# Patient Record
Sex: Female | Born: 1980 | Race: White | Hispanic: No | Marital: Single | Smoking: Former smoker
Health system: Southern US, Community
[De-identification: ages and names within clinical notes are randomized; demographics above are authoritative.]

## PROBLEM LIST (undated history)

## (undated) DIAGNOSIS — R519 Headache, unspecified: Secondary | ICD-10-CM

## (undated) DIAGNOSIS — Z8719 Personal history of other diseases of the digestive system: Secondary | ICD-10-CM

## (undated) DIAGNOSIS — Z8619 Personal history of other infectious and parasitic diseases: Secondary | ICD-10-CM

## (undated) DIAGNOSIS — Z8659 Personal history of other mental and behavioral disorders: Secondary | ICD-10-CM

## (undated) DIAGNOSIS — G43909 Migraine, unspecified, not intractable, without status migrainosus: Secondary | ICD-10-CM

## (undated) DIAGNOSIS — R87629 Unspecified abnormal cytological findings in specimens from vagina: Secondary | ICD-10-CM

## (undated) HISTORY — PX: MOUTH SURGERY: SHX715

## (undated) HISTORY — DX: Migraine, unspecified, not intractable, without status migrainosus: G43.909

## (undated) HISTORY — DX: Personal history of other diseases of the digestive system: Z87.19

## (undated) HISTORY — DX: Personal history of other infectious and parasitic diseases: Z86.19

## (undated) HISTORY — DX: Personal history of other mental and behavioral disorders: Z86.59

## (undated) HISTORY — DX: Headache, unspecified: R51.9

## (undated) HISTORY — DX: Unspecified abnormal cytological findings in specimens from vagina: R87.629

---
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
## 2001-12-10 ENCOUNTER — Encounter: Payer: Self-pay | Admitting: Emergency Medicine

## 2001-12-10 ENCOUNTER — Inpatient Hospital Stay (HOSPITAL_COMMUNITY): Admission: EM | Admit: 2001-12-10 | Discharge: 2001-12-12 | Payer: Self-pay | Admitting: Emergency Medicine

## 2001-12-12 ENCOUNTER — Encounter: Payer: Self-pay | Admitting: Family Medicine

## 2001-12-13 ENCOUNTER — Encounter: Payer: Self-pay | Admitting: Family Medicine

## 2001-12-13 ENCOUNTER — Ambulatory Visit (HOSPITAL_COMMUNITY): Admission: RE | Admit: 2001-12-13 | Discharge: 2001-12-13 | Payer: Self-pay | Admitting: Family Medicine

## 2003-08-18 ENCOUNTER — Inpatient Hospital Stay (HOSPITAL_COMMUNITY): Admission: AD | Admit: 2003-08-18 | Discharge: 2003-08-21 | Payer: Self-pay | Admitting: *Deleted

## 2003-08-18 ENCOUNTER — Observation Stay (HOSPITAL_COMMUNITY): Admission: EM | Admit: 2003-08-18 | Discharge: 2003-08-18 | Payer: Self-pay | Admitting: Emergency Medicine

## 2006-10-27 ENCOUNTER — Emergency Department (HOSPITAL_COMMUNITY): Admission: EM | Admit: 2006-10-27 | Discharge: 2006-10-27 | Payer: Self-pay | Admitting: Emergency Medicine

## 2008-07-25 ENCOUNTER — Emergency Department (HOSPITAL_COMMUNITY): Admission: EM | Admit: 2008-07-25 | Discharge: 2008-07-25 | Payer: Self-pay | Admitting: Emergency Medicine

## 2008-07-28 ENCOUNTER — Emergency Department (HOSPITAL_COMMUNITY): Admission: EM | Admit: 2008-07-28 | Discharge: 2008-07-28 | Payer: Self-pay | Admitting: Emergency Medicine

## 2010-08-28 LAB — URINALYSIS, ROUTINE W REFLEX MICROSCOPIC
Bilirubin Urine: NEGATIVE
Bilirubin Urine: NEGATIVE
Glucose, UA: NEGATIVE mg/dL
Glucose, UA: NEGATIVE mg/dL
Ketones, ur: NEGATIVE mg/dL
Ketones, ur: NEGATIVE mg/dL
Nitrite: NEGATIVE
Nitrite: NEGATIVE
Protein, ur: 30 mg/dL — AB
Protein, ur: NEGATIVE mg/dL
Specific Gravity, Urine: 1.018 (ref 1.005–1.030)
Specific Gravity, Urine: 1.025 (ref 1.005–1.030)
Urobilinogen, UA: 0.2 mg/dL (ref 0.0–1.0)
Urobilinogen, UA: 0.2 mg/dL (ref 0.0–1.0)
pH: 5.5 (ref 5.0–8.0)
pH: 7 (ref 5.0–8.0)

## 2010-08-28 LAB — URINE MICROSCOPIC-ADD ON

## 2010-08-28 LAB — COMPREHENSIVE METABOLIC PANEL
ALT: 30 U/L (ref 0–35)
AST: 26 U/L (ref 0–37)
Albumin: 3.8 g/dL (ref 3.5–5.2)
Alkaline Phosphatase: 68 U/L (ref 39–117)
BUN: 10 mg/dL (ref 6–23)
CO2: 27 mEq/L (ref 19–32)
Calcium: 9.3 mg/dL (ref 8.4–10.5)
Chloride: 105 mEq/L (ref 96–112)
Creatinine, Ser: 0.75 mg/dL (ref 0.4–1.2)
GFR calc Af Amer: >60 mL/min (ref >60)
GFR calc non Af Amer: >60 mL/min (ref >60)
Glucose, Bld: 98 mg/dL (ref 70–99)
Potassium: 4.3 mEq/L (ref 3.5–5.1)
Sodium: 139 mEq/L (ref 135–145)
Total Bilirubin: 0.5 mg/dL (ref 0.3–1.2)
Total Protein: 6.8 g/dL (ref 6.0–8.3)

## 2010-08-28 LAB — DIFFERENTIAL
Basophils Absolute: 0 10*3/uL (ref 0.0–0.1)
Basophils Absolute: 0.1 10*3/uL (ref 0.0–0.1)
Basophils Relative: 0 % (ref 0–1)
Basophils Relative: 1 % (ref 0–1)
Eosinophils Absolute: 0.1 10*3/uL (ref 0.0–0.7)
Eosinophils Absolute: 0.1 10*3/uL (ref 0.0–0.7)
Eosinophils Relative: 0 % (ref 0–5)
Eosinophils Relative: 0 % (ref 0–5)
Lymphocytes Relative: 5 % — ABNORMAL LOW (ref 12–46)
Lymphocytes Relative: 7 % — ABNORMAL LOW (ref 12–46)
Lymphs Abs: 0.7 10*3/uL (ref 0.7–4.0)
Lymphs Abs: 1.1 10*3/uL (ref 0.7–4.0)
Monocytes Absolute: 0.4 10*3/uL (ref 0.1–1.0)
Monocytes Absolute: 0.7 10*3/uL (ref 0.1–1.0)
Monocytes Relative: 3 % (ref 3–12)
Monocytes Relative: 5 % (ref 3–12)
Neutro Abs: 12.3 10*3/uL — ABNORMAL HIGH (ref 1.7–7.7)
Neutro Abs: 13 10*3/uL — ABNORMAL HIGH (ref 1.7–7.7)
Neutrophils Relative %: 87 % — ABNORMAL HIGH (ref 43–77)
Neutrophils Relative %: 90 % — ABNORMAL HIGH (ref 43–77)

## 2010-08-28 LAB — POCT PREGNANCY, URINE: Preg Test, Ur: NEGATIVE

## 2010-08-28 LAB — URINE CULTURE
Colony Count: NO GROWTH
Culture: NO GROWTH

## 2010-08-28 LAB — POCT I-STAT, CHEM 8
BUN: 9 mg/dL (ref 6–23)
Calcium, Ion: 0.99 mmol/L — ABNORMAL LOW (ref 1.12–1.32)
Chloride: 107 mEq/L (ref 96–112)
Creatinine, Ser: 0.9 mg/dL (ref 0.4–1.2)
Glucose, Bld: 100 mg/dL — ABNORMAL HIGH (ref 70–99)
HCT: 39 % (ref 36.0–46.0)
Hemoglobin: 13.3 g/dL (ref 12.0–15.0)
Potassium: 3.7 mEq/L (ref 3.5–5.1)
Sodium: 138 mEq/L (ref 135–145)
TCO2: 23 mmol/L (ref 0–100)

## 2010-08-28 LAB — CBC
HCT: 38.2 % (ref 36.0–46.0)
HCT: 38.3 % (ref 36.0–46.0)
Hemoglobin: 12.9 g/dL (ref 12.0–15.0)
Hemoglobin: 13.4 g/dL (ref 12.0–15.0)
MCHC: 33.9 g/dL (ref 30.0–36.0)
MCHC: 34.9 g/dL (ref 30.0–36.0)
MCV: 91.5 fL (ref 78.0–100.0)
MCV: 91.7 fL (ref 78.0–100.0)
Platelets: 353 10*3/uL (ref 150–400)
Platelets: 375 10*3/uL (ref 150–400)
RBC: 4.16 MIL/uL (ref 3.87–5.11)
RBC: 4.19 MIL/uL (ref 3.87–5.11)
RDW: 13 % (ref 11.5–15.5)
RDW: 13.3 % (ref 11.5–15.5)
WBC: 13.6 10*3/uL — ABNORMAL HIGH (ref 4.0–10.5)
WBC: 14.9 10*3/uL — ABNORMAL HIGH (ref 4.0–10.5)

## 2010-08-28 LAB — WET PREP, GENITAL
Clue Cells Wet Prep HPF POC: NONE SEEN
Trich, Wet Prep: NONE SEEN
Trich, Wet Prep: NONE SEEN
WBC, Wet Prep HPF POC: NONE SEEN
WBC, Wet Prep HPF POC: NONE SEEN
Yeast Wet Prep HPF POC: NONE SEEN
Yeast Wet Prep HPF POC: NONE SEEN

## 2010-08-28 LAB — GC/CHLAMYDIA PROBE AMP, GENITAL
Chlamydia, DNA Probe: NEGATIVE
Chlamydia, DNA Probe: NEGATIVE
GC Probe Amp, Genital: NEGATIVE
GC Probe Amp, Genital: NEGATIVE

## 2010-08-28 LAB — LACTIC ACID, PLASMA: Lactic Acid, Venous: 1.3 mmol/L (ref 0.5–2.2)

## 2010-08-28 LAB — PREGNANCY, URINE: Preg Test, Ur: NEGATIVE

## 2010-08-28 LAB — LIPASE, BLOOD: Lipase: 20 U/L (ref 11–59)

## 2010-10-03 NOTE — H&P (Signed)
NAME:  [name], [name]                           ACCOUNT NO.:  [id number]   MEDICAL RECORD NO.:  [id number]                   PATIENT TYPE:  EMS   LOCATION:  MAJO                                 FACILITY:  [facility]   PHYSICIAN:  Burnice Logan, M.D.               DATE OF BIRTH:  [birth year]   DATE OF ADMISSION:  08/17/2003  DATE OF DISCHARGE:                                HISTORY & PHYSICAL   PRIMARY PHYSICIAN:  Mosetta Putt, M.D.   CHIEF COMPLAINT:  Xanax overdose.   HISTORY OF PRESENT ILLNESS:  This is a 30 year old female patient who admits  taking 5 to 6 tablets of Xanax sometime last night.  This was an apparent  suicide gesture.  She admits having intentions of harming herself.  She  attributes this to inability to cope lately with her stresses in life.  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  She has not  been able to handle these issues very well leading her to overdose on her  medicines.  She has a history of depression but denies any previous such  episode.  She is accompanied by her parents to the emergency room today.  While here in the emergency room, she has had episode of hypotension and  current blood pressure is 86/45.  I was therefore asked to admit the patient  until she is medically stable to be transferred to a psychiatric unit.   PAST MEDICAL HISTORY:  Nothing significant except for depression, migraine  headaches, and dysmenorrhea.   ALLERGIES:  HYDROCODONE.   MEDICATIONS:  Xanax, Wellbutrin XL, Zomig, and Ortho-Novum 1/35.   FAMILY HISTORY:  The patient's grandmother was an alcoholic.  Her father  suffers from depression.   SOCIAL HISTORY:  The patient lives with her parents.  She works in [employer] and has held that job for the last 2 years.  She denies smoking or use  of drugs.  She drinks occasional alcohol but has not done so tonight.   REVIEW OF SYSTEMS:  Denies fever, chills.  No weight loss.  No headaches  presently.  Has no difficulty breathing.  No cough or sputum.  No chest  pain.  Has epigastric pain without nausea or vomiting or diarrhea.  No  dysuria or hematuria.  No myalgias or arthralgias.   PHYSICAL EXAMINATION:  GENERAL:  Young Caucasian lady, well nourished,  average size.  VITAL SIGNS:  Temperature 98, blood pressure 85/44, heart rate 59,  respiratory rate 18 to 20.  HEENT:  Normocephalic, atraumatic.  No facial asymmetry.  Normal eye  movements.  Pupils are round, equal, and symmetric.  Mucous membranes are  moist.  NECK:  Supple.  No thyromegaly.  LUNGS:  Clear to auscultation.  CARDIOVASCULAR:  Heart sounds 1 and  2 regular.  No murmurs.  ABDOMEN:  Soft, nontender.  Normal bowel sounds.  CENTRAL NERVOUS SYSTEM:  Alert and oriented x3 with no focal deficits.  EXTREMITIES:  No pedal edema.   LABORATORY DATA:  Her pH on her ABG 7.39.  Urine drug screen positive only  for benzodiazepines.  Electrolytes within normal limits with glucose 101,  sodium 137, potassium 4.2, chloride 105, BUN 5, creatinine 0.9.  Hemoglobin  is 13.2, hematocrit 39.  Tylenol and salicylate levels are negative and so  is alcohol level.   ASSESSMENT AND PLAN:  Benzodiazepine overdose.  This was an apparent suicide  gesture.  The patient will be admitted to a step-down unit with suicide  precautions.  Will hydrate her with normal saline to bring her blood  pressure up.  My inkling is that her normal blood pressure may be a little  low at 100 systolic.  She currently is asymptomatic and not tachycardic for  her current blood pressure.  She complains of some epigastric discomfort and  she will be given Maalox on a p.r.n. basis.  I will also resume her Ortho-  Novum and treat her with Zomig as needed for migraine headaches.  Her  Wellbutrin will be resumed pending a psychiatric evaluation.  The patient  will initially be observed on  medicine service for 24 hours following which  I anticipate her being transferred to the psychiatric service.                                                Burnice Logan, M.D.    ES/MEDQ  D:  08/18/2003  T:  08/19/2003  Job:  [id number]   cc:   Mosetta Putt, M.D.  [address] [place]  [place] [address]  Fax: [phone]

## 2010-10-03 NOTE — Discharge Summary (Signed)
[name], [name] NO.:  [id number]   MEDICAL RECORD NO.:  [id number]                  PATIENT TYPE:   LOCATION:                                       FACILITY:   PHYSICIAN:  Carolyne Fiscal, M.D.             DATE OF BIRTH:   DATE OF ADMISSION:  12/10/2001  DATE OF DISCHARGE:  12/12/2001                                 DISCHARGE SUMMARY   DISCHARGE DIAGNOSES:  Abdominal pain, probably functional.   HISTORY:  This is a 30 year old white female patient with a 5-year history  of epigastric pain radiating into the lower abdomen associated with nausea.  She was seen as an outpatient 5 days prior to admission and was given  Phenergan, also Vicodin.  She had a prior history of pancreatitis.  In the  24 hours preceding this admission, her pain increased and was associated  with vomiting.  She had not had a prior intolerance to hydrocodone.  Last  menstrual period was 1 week prior to admission and normal, and she was on  oral contraceptives.  She did have a documented episode of pancreatitis at  age 45, felt to be viral.  She had a history of drinking 1 alcoholic drink  per day.   PAST MEDICAL HISTORY:  Otherwise unremarkable except for depression a few  months preceding admission.   PHYSICAL EXAMINATION:   GENERAL:  Physical examination at the time of admission revealed the patient  to be tired-appearing and mildly anxious.   VITAL SIGNS:  Blood pressure:  120/60.  Pulse:  64.  Respirations:  20.  Temperature:  97.9.   HEAD AND NECK EXAM:  Negative except for minimal inflammation of the  pharynx.   LUNGS:  Clear.   HEART:  Negative.   ABDOMEN:  Tender in the epigastric area and in both lower quadrants, the  right more than the left.   PELVIC EXAMINATION:  Negative except for tenderness in the right lower  quadrant.   RECTAL:  Negative.  Hematest-negative stool.   ADMISSION DIAGNOSIS:  1. Acute abdominal pain, questionable etiology,  rule-out pancreatitis.  2. History of depression.   LABORATORY DATA:  A CT scan of the abdomen with contrast was negative.  A CT  scan of the pelvis was negative.  A small bowel follow-through was normal.  At the time admission, CBC was negative.  Sed rate was 1, and CMET was  unremarkable.  Laboratory studies remained normal during her admission.  Her  TSH was normal.  Her urine pregnancy test was negative.  Her urinalysis was  unremarkable.  A GC and Chlamydia probe of the cervix was negative.  RPR was  nonreactive.   HOSPITAL COURSE:  Patient remained afebrile after admission and continued to  have pain in the epigastric right upper quadrant and both lower quadrants of  the abdomen.  She was seen in GI consultation by Dr.  Lina Sar.  She felt  that the patient's abdominal symptoms were difficult to explain and that  they might be associated with decreased pain threshold, although multiple  other etiologies need to be ruled out.  She also elicited a history of an  eating disorder, although that did not readily explain the patient's  symptoms.  She recommended upper endoscopy, which was done on December 10, 2001,  and was normal.  When the patient was initially admitted, she was given  intravenous fluids and also Demerol, Phenergan, and Protonix IV.  She was  later given Zelnorm 6 mg once daily a.c.  She had gradual improvement of her  abdominal pain.  She was improved on the day of discharge but still had  right upper quadrant discomfort, which was fairly constant.  It was felt  that there were multiple stresses in the patient's personal life  exacerbating her depression and anxiety.  It was felt that she could be  discharged if followed closely as an outpatient.   CONDITION AT DISCHARGE:  Satisfactory.  She was seen by Dr. Leone Payor in GI  followup consultation.  He recommended a HIDA scan after discharge.  Follow  up in the office by me 2 days after discharge.   DISCHARGE MEDICATIONS:   Oral contraceptives, Luvox 150 mg daily, start  Nexium 40 mg daily, Vicodin 1 to 2 q.4h. p.r.n. for pain.  Patient did not  have any drug benefits and could not afford Zelnorm.  Some consideration was  given to using MiraLax as an outpatient after discharge.                                               Carolyne Fiscal, M.D.    PFB/MEDQ  D:  01/01/2002  T:  01/04/2002  Job:  [id number]

## 2010-10-03 NOTE — H&P (Signed)
[place]  Patient:    [name], [name] Visit Number: [id number] MRN: [id number]          Service Type: MED Location: 5000 5035 01 Attending Physician:  Boneta Lucks Dictated by:   Heather Roberts, M.D. Admit Date:  12/09/2001   CC:         Carolyne Fiscal, M.D.  Hedwig Morton. Juanda Chance, M.D. [facility]   History and Physical  CHIEF COMPLAINT:  Abdominal pain.  HISTORY OF PRESENT ILLNESS:  [age]-year-old white single female with five days history of epigastric pain radiating to her lower abdomen associated with nausea.  She saw her primary care physician on July 21st, who prescribed Phenergan and hydrocodone because of a prior history of pancreatitis.  In the 24 hours prior to admission, her pain increased and was associated with vomiting.  She took hydrocodone without relief of the pain. She had been concerned because of sensitivity to codeine.  LMP was a week prior to admission and normal.  The patient denies sexual activity ever; parents were in the room at the time of this questioning.  She has a history of pancreatitis at age 30, thought viral.  The patient says she drinks one glass of alcohol a day.  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  CURRENT PRESCRIPTIONS:  BCP for dysmenorrhea and Prozac.  ALLERGIES:  CODEINE.  PAST MEDICAL HISTORY:  See above.  FAMILY HISTORY:  Grandparents have heart disease, maternal aunt -- ovarian cancer.  Denies thyroid or lung.  Father has hiatal hernia.  PHYSICAL EXAMINATION:  GENERAL:  Physical exam reveals a tired, mildly anxious white female.  VITAL SIGNS:  Temperature 97, pulse 101, respiratory rate 18.  HEENT/NECK:  Pharynx minimally injected.  Carotids 2+.  No bruits.  No thyromegaly.  Tympanic membranes negative.  Fundi grossly normal.  CARDIAC:  Normal S1 and S2.  BREASTS:  Normal.  LUNGS:  Clear to A&P.  ABDOMEN:  Abdomen is tender with  diminished breath sounds, no distention, very tender epigastrium, right greater than left lower quadrant equivocal rebound.  PELVIC:  Pelvic shows midpositioned uterus which is nontender, no CMT.  Pelvic shows tender right lower quadrant, no masses.  RECTAL:  Stool is OB-negative.  Rectal exam negative.  LABORATORY AND ACCESSORY DATA:  Pelvic ultrasound allegedly done December 06, 2001 was reported as within normal limits.  CT of abdomen and pelvis within normal limits, some stool.  CBC, CMP and lipase here normal.  IMPRESSION: 1. Acute abdominal pain, questionable etiology.  Amylase will be added to the    laboratory. 2. History of depression.  PLAN:  Admission for IV fluids, further delineation of discomfort, sed rate, amylase, Dr. Hedwig Morton. Brodie consult, Protonix, probable ______ on the day of admission. Dictated by:   Heather Roberts, M.D. Attending Physician:  Boneta Lucks DD:  12/10/01 TD:  12/13/01 Job: [id number]
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
## 2010-10-03 NOTE — Procedures (Signed)
[place]  Patient:    [name], [name] Visit Number: [id number] MRN: [id number]          Service Type: MED Location: 5000 5035 01 Attending Physician:  Boneta Lucks Dictated by:   Hedwig Morton. Juanda Chance, M.D. [facility] Admit Date:  12/09/2001   CC:         Carolyne Fiscal, M.D.   Procedure Report  PROCEDURE:  Upper endoscopy.  ENDOSCOPIST:  Hedwig Morton. Juanda Chance, M.D.  INDICATIONS:  This 31 year old white female was admitted with a five-day history of severe epigastric and right upper quadrant abdominal pain, negative CT scan and all blood tests.  She has a history of similar pain several years ago; she also was diagnosed with eating disorder one year ago.  She vomited on two occasions yesterday.  She is undergoing upper endoscopy to further evaluate her severe upper abdominal pain.  ENDOSCOPE:  Fujinon single-channel video endoscope.  SEDATION:  Versed 7 mg IV, fentanyl 50 mcg IV.  FINDINGS:  Fujinon single-channel video endoscope was passed under direct vision through posterior pharynx into esophagus.  The patient was monitored by a pulse oximeter; her oxygen saturations were normal.  The patient was rather agitated at the beginning of the procedure but she calmed down and was cooperative throughout the rest of the procedure.  Proximal, mid and distal esophageal mucosa was unremarkable with no evidence of inflammatory changes; specifically, the squamocolumnar junction was normal, there was no stricture and no hiatal hernia.  Stomach:  Stomach was insufflated with air and showed normal-appearing gastric folds, gastric body and gastric antrum.  There were no signs of gastritis. The pyloric outlet was normal.  Retroflexion of the endoscope revealed normal fundus and cardia.  Duodenum:  Duodenal bulb and descending duodenum were normal.  IMPRESSION:  Normal endoscopy of esophagus, stomach and duodenum.  PLAN:  The patient will be further  evaluated for her abdominal pain with small-bowel follow-through to rule out Crohns disease.  We will slowly advance her diet and await results of the blood tests that are pending. Dictated by:   Hedwig Morton. Juanda Chance, M.D. [facility] Attending Physician:  Boneta Lucks DD:  12/10/01 TD:  12/13/01 Job: [id number]
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
## 2015-03-05 ENCOUNTER — Ambulatory Visit (INDEPENDENT_AMBULATORY_CARE_PROVIDER_SITE_OTHER): Payer: Managed Care, Other (non HMO) | Admitting: Physician Assistant

## 2015-03-05 ENCOUNTER — Encounter: Payer: Self-pay | Admitting: Physician Assistant

## 2015-03-05 ENCOUNTER — Other Ambulatory Visit (HOSPITAL_COMMUNITY)
Admission: RE | Admit: 2015-03-05 | Discharge: 2015-03-05 | Disposition: A | Discharge status: Home / Self Care | Payer: Managed Care, Other (non HMO) | Source: Ambulatory Visit | Attending: Physician Assistant | Admitting: Physician Assistant

## 2015-03-05 VITALS — BP 94/72 | HR 93 | Ht 64.0 in | Wt 158.0 lb

## 2015-03-05 DIAGNOSIS — J069 Acute upper respiratory infection, unspecified: Secondary | ICD-10-CM | POA: Diagnosis not present

## 2015-03-05 DIAGNOSIS — N63 Unspecified lump in breast: Secondary | ICD-10-CM | POA: Diagnosis not present

## 2015-03-05 DIAGNOSIS — N6320 Unspecified lump in the left breast, unspecified quadrant: Secondary | ICD-10-CM | POA: Insufficient documentation

## 2015-03-05 DIAGNOSIS — N6452 Nipple discharge: Secondary | ICD-10-CM | POA: Insufficient documentation

## 2015-03-05 NOTE — Progress Notes (Signed)
   Subjective:    Patient ID: [name], female    DOB: 09/04/1980, 34 y.o.   MRN: [id number]  HPI  Pt is a 34 yo female who presents to the clinic to establish care.   She is concerned with a left breast lump for one year and recent 3 day hx of nipple discharge. Describes discharge as cloudy white to yellow. No breast pain. fam hx of BC with maternal grandmother and aunt. She has had a breast U/S in 2010 for another lump. No fever, chills or weight loss.   She woke up this morning with ST, nasal congestion and ear pain. No fever or chills. Not tried anything to make better.   .. Active Ambulatory Problems    Diagnosis Date Noted  . Left breast lump 03/05/2015  . Nipple discharge 03/05/2015   Resolved Ambulatory Problems    Diagnosis Date Noted  . No Resolved Ambulatory Problems   No Additional Past Medical History    .[name] Family History  Problem Relation Age of Onset  . Stroke Father    .[name] Social History   Social History  . Marital Status: Single    Spouse Name: N/A  . Number of Children: N/A  . Years of Education: N/A   Occupational History  . Not on file.   Social History Main Topics  . Smoking status: Current Every Day Smoker -- 0.50 packs/day    Types: Cigarettes  . Smokeless tobacco: Never Used  . Alcohol Use: 1.2 oz/week    2 Standard drinks or equivalent per week  . Drug Use: Not on file  . Sexual Activity: Yes   Other Topics Concern  . Not on file   Social History Narrative  . No narrative on file    Review of Systems   See HPI.      Objective:   Physical Exam  Constitutional: She is oriented to person, place, and time. She appears well-developed and well-nourished.  HENT:  Head: Normocephalic and atraumatic.  Right Ear: External ear normal.  Left Ear: External ear normal.  Mouth/Throat: Oropharynx is clear and moist. No oropharyngeal exudate.  TM's clear bilaterally.  Mild maxillary pressure to palpation.  Bilateral nare irritation  and red and swollen turbinates.   Eyes: Conjunctivae are normal. Right eye exhibits no discharge. Left eye exhibits no discharge.  Neck: Normal range of motion. Neck supple.  Cardiovascular: Normal rate, regular rhythm and normal heart sounds.   Pulmonary/Chest: Effort normal and breath sounds normal. She has no wheezes.    Lymphadenopathy:    She has no cervical adenopathy.  Neurological: She is alert and oriented to person, place, and time.  Skin: Skin is dry.  Psychiatric: She has a normal mood and affect. Her behavior is normal.          Assessment & Plan:  Left breast lump/nipple discharge- prolactin, cbc, TSH ordered. Will send nipple discharge off for cytology. Ordered U/S to evaluate lump.   Acute respiratory infection- encouraged pt to start symptomatic care with mucinex D and delsym. If not improving by Friday call back to consider abx therapy.

## 2015-03-05 NOTE — Patient Instructions (Signed)
mucinex D and delsym for cough.   Upper Respiratory Infection, Adult Most upper respiratory infections (URIs) are a viral infection of the air passages leading to the lungs. A URI affects the nose, throat, and upper air passages. The most common type of URI is nasopharyngitis and is typically referred to as "the common cold." URIs run their course and usually go away on their own. Most of the time, a URI does not require medical attention, but sometimes a bacterial infection in the upper airways can follow a viral infection. This is called a secondary infection. Sinus and middle ear infections are common types of secondary upper respiratory infections. Bacterial pneumonia can also complicate a URI. A URI can worsen asthma and chronic obstructive pulmonary disease (COPD). Sometimes, these complications can require emergency medical care and may be life threatening.  CAUSES Almost all URIs are caused by viruses. A virus is a type of germ and can spread from one person to another.  RISKS FACTORS You may be at risk for a URI if:   You smoke.   You have chronic heart or lung disease.  You have a weakened defense (immune) system.   You are very young or very old.   You have nasal allergies or asthma.  You work in crowded or poorly ventilated areas.  You work in health care facilities or schools. SIGNS AND SYMPTOMS  Symptoms typically develop 2-3 days after you come in contact with a cold virus. Most viral URIs last 7-10 days. However, viral URIs from the influenza virus (flu virus) can last 14-18 days and are typically more severe. Symptoms may include:   Runny or stuffy (congested) nose.   Sneezing.   Cough.   Sore throat.   Headache.   Fatigue.   Fever.   Loss of appetite.   Pain in your forehead, behind your eyes, and over your cheekbones (sinus pain).  Muscle aches.  DIAGNOSIS  Your health care provider may diagnose a URI by:  Physical exam.  Tests to  check that your symptoms are not due to another condition such as:  Strep throat.  Sinusitis.  Pneumonia.  Asthma. TREATMENT  A URI goes away on its own with time. It cannot be cured with medicines, but medicines may be prescribed or recommended to relieve symptoms. Medicines may help:  Reduce your fever.  Reduce your cough.  Relieve nasal congestion. HOME CARE INSTRUCTIONS   Take medicines only as directed by your health care provider.   Gargle warm saltwater or take cough drops to comfort your throat as directed by your health care provider.  Use a warm mist humidifier or inhale steam from a shower to increase air moisture. This may make it easier to breathe.  Drink enough fluid to keep your urine clear or pale yellow.   Eat soups and other clear broths and maintain good nutrition.   Rest as needed.   Return to work when your temperature has returned to normal or as your health care provider advises. You may need to stay home longer to avoid infecting others. You can also use a face mask and careful hand washing to prevent spread of the virus.  Increase the usage of your inhaler if you have asthma.   Do not use any tobacco products, including cigarettes, chewing tobacco, or electronic cigarettes. If you need help quitting, ask your health care provider. PREVENTION  The best way to protect yourself from getting a cold is to practice good hygiene.   Avoid  oral or hand contact with people with cold symptoms.   Wash your hands often if contact occurs.  There is no clear evidence that vitamin C, vitamin E, echinacea, or exercise reduces the chance of developing a cold. However, it is always recommended to get plenty of rest, exercise, and practice good nutrition.  SEEK MEDICAL CARE IF:   You are getting worse rather than better.   Your symptoms are not controlled by medicine.   You have chills.  You have worsening shortness of breath.  You have brown or red  mucus.  You have yellow or brown nasal discharge.  You have pain in your face, especially when you bend forward.  You have a fever.  You have swollen neck glands.  You have pain while swallowing.  You have white areas in the back of your throat. SEEK IMMEDIATE MEDICAL CARE IF:   You have severe or persistent:  Headache.  Ear pain.  Sinus pain.  Chest pain.  You have chronic lung disease and any of the following:  Wheezing.  Prolonged cough.  Coughing up blood.  A change in your usual mucus.  You have a stiff neck.  You have changes in your:  Vision.  Hearing.  Thinking.  Mood. MAKE SURE YOU:   Understand these instructions.  Will watch your condition.  Will get help right away if you are not doing well or get worse.   This information is not intended to replace advice given to you by your health care provider. Make sure you discuss any questions you have with your health care provider.   Document Released: 10/28/2000 Document Revised: 09/18/2014 Document Reviewed: 08/09/2013 Elsevier Interactive Patient Education [employer].

## 2015-03-06 LAB — CBC WITH DIFFERENTIAL/PLATELET
Basophils Absolute: 0.1 10*3/uL (ref 0.0–0.1)
Basophils Relative: 1 % (ref 0–1)
Eosinophils Absolute: 0.1 10*3/uL (ref 0.0–0.7)
Eosinophils Relative: 1 % (ref 0–5)
HCT: 41.2 % (ref 36.0–46.0)
Hemoglobin: 13.9 g/dL (ref 12.0–15.0)
Lymphocytes Relative: 8 % — ABNORMAL LOW (ref 12–46)
Lymphs Abs: 1 10*3/uL (ref 0.7–4.0)
MCH: 31.2 pg (ref 26.0–34.0)
MCHC: 33.7 g/dL (ref 30.0–36.0)
MCV: 92.6 fL (ref 78.0–100.0)
MPV: 10.9 fL (ref 8.6–12.4)
Monocytes Absolute: 1 10*3/uL (ref 0.1–1.0)
Monocytes Relative: 8 % (ref 3–12)
Neutro Abs: 10.6 10*3/uL — ABNORMAL HIGH (ref 1.7–7.7)
Neutrophils Relative %: 82 % — ABNORMAL HIGH (ref 43–77)
Platelets: 410 10*3/uL — ABNORMAL HIGH (ref 150–400)
RBC: 4.45 MIL/uL (ref 3.87–5.11)
RDW: 13 % (ref 11.5–15.5)
WBC: 12.9 10*3/uL — ABNORMAL HIGH (ref 4.0–10.5)

## 2015-03-06 LAB — TSH: TSH: 1.02 u[IU]/mL (ref 0.350–4.500)

## 2015-03-06 LAB — PROLACTIN: Prolactin: 7 ng/mL

## 2015-03-18 ENCOUNTER — Other Ambulatory Visit: Payer: Self-pay

## 2015-03-18 ENCOUNTER — Other Ambulatory Visit: Payer: Self-pay | Admitting: Physician Assistant

## 2015-03-18 DIAGNOSIS — N6452 Nipple discharge: Secondary | ICD-10-CM

## 2015-03-18 DIAGNOSIS — N6320 Unspecified lump in the left breast, unspecified quadrant: Secondary | ICD-10-CM

## 2015-03-22 ENCOUNTER — Ambulatory Visit
Admission: RE | Admit: 2015-03-22 | Discharge: 2015-03-22 | Disposition: A | Discharge status: Home / Self Care | Payer: Managed Care, Other (non HMO) | Source: Ambulatory Visit | Attending: Physician Assistant | Admitting: Physician Assistant

## 2015-03-22 DIAGNOSIS — N6452 Nipple discharge: Secondary | ICD-10-CM

## 2015-03-22 DIAGNOSIS — N6320 Unspecified lump in the left breast, unspecified quadrant: Secondary | ICD-10-CM

## 2015-07-09 ENCOUNTER — Encounter: Payer: Self-pay | Admitting: Physician Assistant

## 2015-07-09 ENCOUNTER — Ambulatory Visit (INDEPENDENT_AMBULATORY_CARE_PROVIDER_SITE_OTHER): Payer: Managed Care, Other (non HMO) | Admitting: Physician Assistant

## 2015-07-09 VITALS — BP 125/70 | HR 65 | Ht 64.0 in | Wt 156.0 lb

## 2015-07-09 DIAGNOSIS — F603 Borderline personality disorder: Secondary | ICD-10-CM | POA: Diagnosis not present

## 2015-07-09 DIAGNOSIS — F329 Major depressive disorder, single episode, unspecified: Secondary | ICD-10-CM

## 2015-07-09 DIAGNOSIS — F411 Generalized anxiety disorder: Secondary | ICD-10-CM | POA: Diagnosis not present

## 2015-07-09 DIAGNOSIS — F3162 Bipolar disorder, current episode mixed, moderate: Secondary | ICD-10-CM | POA: Diagnosis not present

## 2015-07-09 DIAGNOSIS — F32A Depression, unspecified: Secondary | ICD-10-CM

## 2015-07-09 MED ORDER — FLUOXETINE HCL 10 MG PO TABS: 10.0000 mg | ORAL_TABLET | Freq: Every day | ORAL | Status: DC

## 2015-07-09 MED ORDER — ALPRAZOLAM 0.5 MG PO TABS: 0.5000 mg | ORAL_TABLET | Freq: Every evening | ORAL | Status: DC | PRN

## 2015-07-09 MED ORDER — BUPROPION HCL ER (XL) 150 MG PO TB24: 150.0000 mg | ORAL_TABLET | ORAL | Status: DC

## 2015-07-09 NOTE — Progress Notes (Signed)
   Subjective:    Patient ID: [name], female    DOB: 19-Nov-1980, 35 y.o.   MRN: [id number]  HPI  Pt is a 35 yo female who presents to the clinic wanting to get back on medications for previous dx of depression, anxiety, bipolar, and borderline personality disorder. She was controlled for many years but thought she could do it on her own for the last 1-2 years. She was ok with minimal ups and downs but for the last 3 months she has been very depressed. She has no motivation. She has also had suicidal thoughts. She attempted suicide in 2005 with pills. She vowed not to do that do family. Last episode of mania was for 2 days about 4 weeks ago. She thought she was better but then dropped really fast.    Review of Systems  All other systems reviewed and are negative.      Objective:   Physical Exam  Constitutional: She is oriented to person, place, and time. She appears well-developed and well-nourished.  HENT:  Head: Normocephalic and atraumatic.  Cardiovascular: Normal rate, regular rhythm and normal heart sounds.   Pulmonary/Chest: Effort normal and breath sounds normal. She has no wheezes.  Neurological: She is alert and oriented to person, place, and time.  Skin: Skin is dry.  Psychiatric: Her behavior is normal.  Tearful.          Assessment & Plan:  Depression/Anxiety/Borderliine personality disorder/bipolar mixed- PHQ-9 was 20. GAD-7 was 19. MDQ was 7 out of 13. restarted on last regimen that she was controlled with. wellbutrin and prozac with xanax as needed. Discussed with take some time to get back in systems. Discussion about not being on a mood stablizer. That would be my next step in 4 weeks if not improving. Pt aware only to use xanax if needed and discussed abuse potential. Encouraged counseling but patient does not feel like helped. Declines referral to psych. Encouraged exercise to help with overall mood.   Discussed need for pap.

## 2015-07-16 ENCOUNTER — Telehealth: Payer: Self-pay | Admitting: Physician Assistant

## 2015-07-16 NOTE — Telephone Encounter (Signed)
PA for Wellbutrin created via CoverMyMeds. Awaiting response.

## 2015-07-17 NOTE — Telephone Encounter (Signed)
Received authorization. Valid dates: 07/16/15-10/13/15. Pharmacy and Pt notified.

## 2015-08-06 ENCOUNTER — Ambulatory Visit (INDEPENDENT_AMBULATORY_CARE_PROVIDER_SITE_OTHER): Payer: Managed Care, Other (non HMO) | Admitting: Physician Assistant

## 2015-08-06 ENCOUNTER — Encounter: Payer: Self-pay | Admitting: Physician Assistant

## 2015-08-06 VITALS — BP 110/65 | HR 74 | Ht 64.0 in | Wt 155.0 lb

## 2015-08-06 DIAGNOSIS — F411 Generalized anxiety disorder: Secondary | ICD-10-CM

## 2015-08-06 DIAGNOSIS — F329 Major depressive disorder, single episode, unspecified: Secondary | ICD-10-CM | POA: Diagnosis not present

## 2015-08-06 DIAGNOSIS — F3162 Bipolar disorder, current episode mixed, moderate: Secondary | ICD-10-CM | POA: Diagnosis not present

## 2015-08-06 DIAGNOSIS — F32A Depression, unspecified: Secondary | ICD-10-CM

## 2015-08-06 MED ORDER — BUPROPION HCL ER (XL) 300 MG PO TB24
300.0000 mg | ORAL_TABLET | Freq: Every day | ORAL | Status: DC
Start: 2015-08-06 — End: 2016-03-20

## 2015-08-06 MED ORDER — FLUOXETINE HCL 20 MG PO TABS: 20.0000 mg | ORAL_TABLET | Freq: Every day | ORAL | Status: DC

## 2015-08-06 NOTE — Patient Instructions (Signed)
Lamictal - might consider adding.

## 2015-08-06 NOTE — Progress Notes (Signed)
   Subjective:    Patient ID: [name], female    DOB: [birth year], 35 y.o.   MRN: [id number]  HPI  Pt is a 35 yo female who presents to the clinic for 4 week follow up on depression and anxiety. She is on wellbutrin and prozac. She feels 40 percent improvement. Most days she feels really good. She has had a few days of up and down moods. Denies any suicidal or homicidal thoughts. She is not exercising.   Review of Systems  All other systems reviewed and are negative.      Objective:   Physical Exam  Constitutional: She is oriented to person, place, and time. She appears well-developed and well-nourished.  Cardiovascular: Normal rate, regular rhythm and normal heart sounds.   Pulmonary/Chest: Effort normal and breath sounds normal.  Neurological: She is alert and oriented to person, place, and time.  Psychiatric: She has a normal mood and affect. Her behavior is normal.          Assessment & Plan:  GAD/Depression/Bipolar 1, mixed- PHQ-9 was 14 down from 20. GAD-7 was 11 down from 19. Increased wellbutrin XL 300mg  daily and prozac 20mg . Follow up 3 months. Encouraged to exercise. Will consider adding lamictal at 3 months if still having mood variations.

## 2015-09-30 ENCOUNTER — Telehealth: Payer: Self-pay | Admitting: *Deleted

## 2015-09-30 ENCOUNTER — Ambulatory Visit: Payer: Managed Care, Other (non HMO) | Admitting: Family Medicine

## 2015-09-30 NOTE — Telephone Encounter (Signed)
Pt called this morning stating that she thinks she's pregnant & wants to know what to do with her depression/anxiety meds.  I've put her on your schedule tomorrow since [place] isn't back until Wed.  I tried calling pt to get some information from her & to notify her of appt but had to leave vm.

## 2015-10-01 ENCOUNTER — Ambulatory Visit (INDEPENDENT_AMBULATORY_CARE_PROVIDER_SITE_OTHER): Payer: Managed Care, Other (non HMO) | Admitting: Family Medicine

## 2015-10-01 ENCOUNTER — Encounter: Payer: Self-pay | Admitting: Family Medicine

## 2015-10-01 VITALS — BP 118/74 | HR 87 | Wt 156.0 lb

## 2015-10-01 DIAGNOSIS — N926 Irregular menstruation, unspecified: Secondary | ICD-10-CM | POA: Diagnosis not present

## 2015-10-01 DIAGNOSIS — Z3481 Encounter for supervision of other normal pregnancy, first trimester: Secondary | ICD-10-CM

## 2015-10-01 DIAGNOSIS — F3162 Bipolar disorder, current episode mixed, moderate: Secondary | ICD-10-CM

## 2015-10-01 LAB — POCT URINE PREGNANCY: Preg Test, Ur: POSITIVE — AB

## 2015-10-01 NOTE — Patient Instructions (Addendum)
Do not use any more Xanax. Please start an over-the-counter prenatal vitamin as soon as possible. Prenatal Care WHAT IS PRENATAL CARE?  Prenatal care is the process of caring for a pregnant woman before she gives birth. Prenatal care makes sure that she and her baby remain as healthy as possible throughout pregnancy. Prenatal care may be provided by a midwife, family practice health care provider, or a childbirth and pregnancy specialist (obstetrician). Prenatal care may include physical examinations, testing, treatments, and education on nutrition, lifestyle, and social support services. WHY IS PRENATAL CARE SO IMPORTANT?  Early and consistent prenatal care increases the chance that you and your baby will remain healthy throughout your pregnancy. This type of care also decreases a baby's risk of being born too early (prematurely), or being born smaller than expected (small for gestational age). Any underlying medical conditions you may have that could pose a risk during your pregnancy are discussed during prenatal care visits. You will also be monitored regularly for any new conditions that may arise during your pregnancy so they can be treated quickly and effectively. WHAT HAPPENS DURING PRENATAL CARE VISITS? Prenatal care visits may include the following: Discussion Tell your health care provider about any new signs or symptoms you have experienced since your last visit. These might include:  Nausea or vomiting.  Increased or decreased level of energy.  Difficulty sleeping.  Back or leg pain.  Weight changes.  Frequent urination.  Shortness of breath with physical activity.  Changes in your skin, such as the development of a rash or itchiness.  Vaginal discharge or bleeding.  Feelings of excitement or nervousness.  Changes in your baby's movements. You may want to write down any questions or topics you want to discuss with your health care provider and bring them with you to your  appointment. Examination During your first prenatal care visit, you will likely have a complete physical exam. Your health care provider will often examine your vagina, cervix, and the position of your uterus, as well as check your heart, lungs, and other body systems. As your pregnancy progresses, your health care provider will measure the size of your uterus and your baby's position inside your uterus. He or she may also examine you for early signs of labor. Your prenatal visits may also include checking your blood pressure and, after about 10-12 weeks of pregnancy, listening to your baby's heartbeat. Testing Regular testing often includes:  Urinalysis. This checks your urine for glucose, protein, or signs of infection.  Blood count. This checks the levels of white and red blood cells in your body.  Tests for sexually transmitted infections (STIs). Testing for STIs at the beginning of pregnancy is routinely done and is required in many states.  Antibody testing. You will be checked to see if you are immune to certain illnesses, such as rubella, that can affect a developing fetus.  Glucose screen. Around 24-28 weeks of pregnancy, your blood glucose level will be checked for signs of gestational diabetes. Follow-up tests may be recommended.  Group B strep. This is a bacteria that is commonly found inside a woman's vagina. This test will inform your health care provider if you need an antibiotic to reduce the amount of this bacteria in your body prior to labor and childbirth.  Ultrasound. Many pregnant women undergo an ultrasound screening around 18-20 weeks of pregnancy to evaluate the health of the fetus and check for any developmental abnormalities.  HIV (human immunodeficiency virus) testing. Early in your pregnancy,  you will be screened for HIV. If you are at high risk for HIV, this test may be repeated during your third trimester of pregnancy. You may be offered other testing based on your  age, personal or family medical history, or other factors.  HOW OFTEN SHOULD I PLAN TO SEE MY HEALTH CARE PROVIDER FOR PRENATAL CARE? Your prenatal care check-up schedule depends on any medical conditions you have before, or develop during, your pregnancy. If you do not have any underlying medical conditions, you will likely be seen for checkups:  Monthly, during the first 6 months of pregnancy.  Twice a month during months 7 and 8 of pregnancy.  Weekly starting in the 9th month of pregnancy and until delivery. If you develop signs of early labor or other concerning signs or symptoms, you may need to see your health care provider more often. Ask your health care provider what prenatal care schedule is best for you. WHAT CAN I DO TO KEEP MYSELF AND MY BABY AS HEALTHY AS POSSIBLE DURING MY PREGNANCY?  Take a prenatal vitamin containing 400 micrograms (0.4 mg) of folic acid every day. Your health care provider may also ask you to take additional vitamins such as iodine, vitamin D, iron, copper, and zinc.  Take 1500-2000 mg of calcium daily starting at your 20th week of pregnancy until you deliver your baby.  Make sure you are up to date on your vaccinations. Unless directed otherwise by your health care provider:  You should receive a tetanus, diphtheria, and pertussis (Tdap) vaccination between the 27th and 36th week of your pregnancy, regardless of when your last Tdap immunization occurred. This helps protect your baby from whooping cough (pertussis) after he or she is born.  You should receive an annual inactivated influenza vaccine (IIV) to help protect you and your baby from influenza. This can be done at any point during your pregnancy.  Eat a well-rounded diet that includes:  Fresh fruits and vegetables.  Lean proteins.  Calcium-rich foods such as milk, yogurt, hard cheeses, and dark, leafy greens.  Whole grain breads.  Do noteat seafood high in mercury,  including:  Swordfish.  Tilefish.  Shark.  King mackerel.  More than 6 oz tuna per week.  Do not eat:  Raw or undercooked meats or eggs.  Unpasteurized foods, such as soft cheeses (brie, blue, or feta), juices, and milks.  Lunch meats.  Hot dogs that have not been heated until they are steaming.  Drink enough water to keep your urine clear or pale yellow. For many women, this may be 10 or more 8 oz glasses of water each day. Keeping yourself hydrated helps deliver nutrients to your baby and may prevent the start of pre-term uterine contractions.  Do not use any tobacco products including cigarettes, chewing tobacco, or electronic cigarettes. If you need help quitting, ask your health care provider.  Do not drink beverages containing alcohol. No safe level of alcohol consumption during pregnancy has been determined.  Do not use any illegal drugs. These can harm your developing baby or cause a miscarriage.  Ask your health care provider or pharmacist before taking any prescription or over-the-counter medicines, herbs, or supplements.  Limit your caffeine intake to no more than 200 mg per day.  Exercise. Unless told otherwise by your health care provider, try to get 30 minutes of moderate exercise most days of the week. Do not  do high-impact activities, contact sports, or activities with a high risk of falling, such as  horseback riding or downhill skiing.  Get plenty of rest.  Avoid anything that raises your body temperature, such as hot tubs and saunas.  If you own a cat, do not empty its litter box. Bacteria contained in cat feces can cause an infection called toxoplasmosis. This can result in serious harm to the fetus.  Stay away from chemicals such as insecticides, lead, mercury, and cleaning or paint products that contain solvents.  Do not have any X-rays taken unless medically necessary.  Take a childbirth and breastfeeding preparation class. Ask your health care  provider if you need a referral or recommendation.   This information is not intended to replace advice given to you by your health care provider. Make sure you discuss any questions you have with your health care provider.   Document Released: 05/07/2003 Document Revised: 05/25/2014 Document Reviewed: 07/19/2013 Elsevier Interactive Patient Education [employer].

## 2015-10-01 NOTE — Progress Notes (Signed)
Subjective:    CC: Pregnant?   HPI:  Patient comes in today to confirm pregnancy. Her last menstrual period was 08/29/2015. She said she did 6 home tests and they were all positive. She does have a history of bipolar disorder and is currently taking fluoxetine 20 g and Wellbutrin 300 mg as well as Xanax as needed. She actually recently restarted the medications about 3 months ago after a severe depressive episode. She's not currently being followed with psychiatry. She did quit smoking is seen as she found out she was pregnant. She has not started a prenatal vitamin. [facility] which is where her sister-in-law goes. A lot of daily caffeine. She says that she her husband were trying about a year ago and finally just gave up but she has not been on birth control.  Past medical history, Surgical history, Family history not pertinant except as noted below, Social history, Allergies, and medications have been entered into the medical record, reviewed, and corrections made.   BP 118/74 mmHg  Pulse 87  Wt 156 lb (70.761 kg)  SpO2 100%  LMP 08/29/2015    Allergies  Allergen Reactions  . Codeine Swelling    No past medical history on file.  No past surgical history on file.  Social History   Social History  . Marital Status: Single    Spouse Name: N/A  . Number of Children: N/A  . Years of Education: N/A   Occupational History  . Not on file.   Social History Main Topics  . Smoking status: Current Every Day Smoker -- 0.50 packs/day    Types: Cigarettes  . Smokeless tobacco: Never Used  . Alcohol Use: 1.2 oz/week    2 Standard drinks or equivalent per week  . Drug Use: Not on file  . Sexual Activity: Yes   Other Topics Concern  . Not on file   Social History Narrative    Family History  Problem Relation Age of Onset  . Stroke Father     Outpatient Encounter Prescriptions as of 10/01/2015  Medication Sig  .  buPROPion (WELLBUTRIN XL) 300 MG 24 hr tablet Take 1 tablet (300 mg total) by mouth daily.  [name] FLUoxetine (PROZAC) 20 MG tablet Take 1 tablet (20 mg total) by mouth daily.  . [DISCONTINUED] ALPRAZolam (XANAX) 0.5 MG tablet Take 1 tablet (0.5 mg total) by mouth at bedtime as needed for anxiety or sleep.  . [DISCONTINUED] FLUoxetine (PROZAC) 10 MG tablet Take 1 tablet (10 mg total) by mouth daily.   No facility-administered encounter medications on file as of 10/01/2015.       Review of Systems: No fevers, chills, night sweats, weight loss, chest pain, or shortness of breath.   Objective:    General: Well Developed, well nourished, and in no acute distress.  Neuro: Alert and oriented x3, extra-ocular muscles intact, sensation grossly intact.  HEENT: Normocephalic, atraumatic  Skin: Warm and dry, no rashes. Cardiac: Regular rate and rhythm, no murmurs rubs or gallops, no lower extremity edema.  Respiratory: Clear to auscultation bilaterally. Not using accessory muscles, speaking in full sentences.   Impression and Recommendations:    Pregnancy - reviewed meds.  Start PNV.  Stop caffeine and any alcohol intake. Ok to exercise. Avoid nitrates.  UPT pos. Will get serum quant for cofirmation. He graduated her on the pregnancy. EDD 06/04/16.  Refer to [facility] in [employer].   Bipolar D/O 1 -  will refer to psych to help manage meds during pregnancy.  Stop xanax.

## 2015-10-02 ENCOUNTER — Ambulatory Visit: Payer: Managed Care, Other (non HMO) | Admitting: Physician Assistant

## 2015-10-02 LAB — HCG, QUANTITATIVE, PREGNANCY: hCG, Beta Chain, Quant, S: 1621.3 m[IU]/mL — ABNORMAL HIGH

## 2015-10-25 ENCOUNTER — Telehealth: Payer: Self-pay | Admitting: Physician Assistant

## 2015-10-25 NOTE — Telephone Encounter (Signed)
Pt called clinic stating she her stomach is much larger than it has been and she is very constipated. Pt reports her stomach has gotten "much larger" in the last 2-3 days and she "looks like she's 6 months pregnant." Pt reports she had her first ultrasound last week, the baby appeared healthy. Spoke with PCP. Advised Pt to take OTC Miralax BID with gatorade and Colace stool softeners. Advised if she does not have a BM within 24 hours, or if she begins having abdominal pain to visit ED. Verbalized understanding. No further questions at this time.

## 2015-10-30 LAB — OB RESULTS CONSOLE RUBELLA ANTIBODY, IGM: Rubella: IMMUNE

## 2015-10-30 LAB — OB RESULTS CONSOLE HEPATITIS B SURFACE ANTIGEN: Hepatitis B Surface Ag: NEGATIVE

## 2015-10-30 LAB — OB RESULTS CONSOLE GC/CHLAMYDIA
Chlamydia: NEGATIVE
Gonorrhea: NEGATIVE

## 2015-10-30 LAB — OB RESULTS CONSOLE ABO/RH: RH Type: POSITIVE

## 2015-10-30 LAB — OB RESULTS CONSOLE RPR: RPR: NONREACTIVE

## 2015-10-30 LAB — OB RESULTS CONSOLE ANTIBODY SCREEN: Antibody Screen: NEGATIVE

## 2015-10-30 LAB — OB RESULTS CONSOLE HIV ANTIBODY (ROUTINE TESTING): HIV: NONREACTIVE

## 2015-11-06 ENCOUNTER — Ambulatory Visit: Payer: Managed Care, Other (non HMO) | Admitting: Physician Assistant

## 2016-03-13 ENCOUNTER — Other Ambulatory Visit (HOSPITAL_COMMUNITY): Payer: Self-pay | Admitting: *Deleted

## 2016-03-16 ENCOUNTER — Ambulatory Visit (HOSPITAL_COMMUNITY)
Admission: RE | Admit: 2016-03-16 | Discharge: 2016-03-16 | Disposition: A | Discharge status: Home / Self Care | Payer: Managed Care, Other (non HMO) | Source: Ambulatory Visit | Attending: Obstetrics and Gynecology | Admitting: Obstetrics and Gynecology

## 2016-03-16 DIAGNOSIS — O99012 Anemia complicating pregnancy, second trimester: Secondary | ICD-10-CM | POA: Diagnosis present

## 2016-03-16 DIAGNOSIS — Z3A00 Weeks of gestation of pregnancy not specified: Secondary | ICD-10-CM | POA: Diagnosis not present

## 2016-03-16 MED ORDER — SODIUM CHLORIDE 0.9 % IV SOLN
510.0000 mg | INTRAVENOUS | Status: DC
  Administered 2016-03-16: 510 mg via INTRAVENOUS
  Filled 2016-03-16: qty 17

## 2016-03-16 NOTE — Discharge Instructions (Signed)

## 2016-03-16 NOTE — Progress Notes (Signed)
Pt tolerated feraheme infusion well without complaint and VSS and DC home

## 2016-03-19 ENCOUNTER — Ambulatory Visit (HOSPITAL_COMMUNITY)
Admission: RE | Admit: 2016-03-19 | Discharge: 2016-03-19 | Disposition: A | Discharge status: Home / Self Care | Payer: Managed Care, Other (non HMO) | Source: Ambulatory Visit | Attending: Obstetrics and Gynecology | Admitting: Obstetrics and Gynecology

## 2016-03-19 DIAGNOSIS — O99012 Anemia complicating pregnancy, second trimester: Secondary | ICD-10-CM | POA: Diagnosis not present

## 2016-03-19 DIAGNOSIS — Z3A00 Weeks of gestation of pregnancy not specified: Secondary | ICD-10-CM | POA: Diagnosis not present

## 2016-03-19 MED ORDER — SODIUM CHLORIDE 0.9 % IV SOLN
510.0000 mg | INTRAVENOUS | Status: DC
  Filled 2016-03-19: qty 17

## 2016-03-19 MED ORDER — SODIUM CHLORIDE 0.9 % IV SOLN
510.0000 mg | INTRAVENOUS | Status: DC
  Administered 2016-03-19: 510 mg via INTRAVENOUS
  Filled 2016-03-19: qty 17

## 2016-03-20 ENCOUNTER — Ambulatory Visit (INDEPENDENT_AMBULATORY_CARE_PROVIDER_SITE_OTHER): Payer: Managed Care, Other (non HMO) | Admitting: Physician Assistant

## 2016-03-20 ENCOUNTER — Encounter: Payer: Self-pay | Admitting: Physician Assistant

## 2016-03-20 VITALS — BP 109/62 | HR 96 | Ht 64.0 in | Wt 194.0 lb

## 2016-03-20 DIAGNOSIS — D509 Iron deficiency anemia, unspecified: Secondary | ICD-10-CM | POA: Diagnosis not present

## 2016-03-20 DIAGNOSIS — Z0000 Encounter for general adult medical examination without abnormal findings: Secondary | ICD-10-CM | POA: Diagnosis not present

## 2016-03-20 DIAGNOSIS — Z3A29 29 weeks gestation of pregnancy: Secondary | ICD-10-CM | POA: Diagnosis not present

## 2016-03-20 NOTE — Progress Notes (Signed)
Subjective:     [name] is a 35 y.o. female and is here for a comprehensive physical exam. The patient reports problems - pt is [redacted] weeks pregnant and severly IDA. she is having iron infusions twice a week. .  Social History   Social History  . Marital status: Single    Spouse name: N/A  . Number of children: N/A  . Years of education: N/A   Occupational History  . Not on file.   Social History Main Topics  . Smoking status: Former Smoker    Packs/day: 0.50    Types: Cigarettes    Quit date: 09/16/2015  . Smokeless tobacco: Never Used  . Alcohol use 1.2 oz/week    2 Standard drinks or equivalent per week  . Drug use: Unknown  . Sexual activity: Yes   Other Topics Concern  . Not on file   Social History Narrative  . No narrative on file   Health Maintenance  Topic Date Due  . HIV Screening  06/13/1995  . PAP SMEAR  06/12/2001  . INFLUENZA VACCINE  07/08/2016 (Originally 12/17/2015)  . TETANUS/TDAP  05/18/2021    The following portions of the patient's history were reviewed and updated as appropriate: allergies, current medications, past family history, past medical history, past social history, past surgical history and problem list.  Review of Systems A comprehensive review of systems was negative.   Objective:    BP 109/62   Pulse 96   Ht 5\' 4"  (1.626 m)   Wt 194 lb (88 kg)   LMP 08/29/2015   BMI 33.30 kg/m  General appearance: alert, cooperative and appears stated age Head: Normocephalic, without obvious abnormality, atraumatic Eyes: conjunctivae/corneas clear. PERRL, EOM's intact. Fundi benign. Ears: normal TM's and external ear canals both ears Nose: Nares normal. Septum midline. Mucosa normal. No drainage or sinus tenderness. Throat: lips, mucosa, and tongue normal; teeth and gums normal Neck: no adenopathy, no carotid bruit, no JVD, supple, symmetrical, trachea midline and thyroid not enlarged, symmetric, no tenderness/mass/nodules Back: symmetric,  no curvature. ROM normal. No CVA tenderness. Lungs: clear to auscultation bilaterally Heart: regular rate and rhythm, S1, S2 normal, no murmur, click, rub or gallop Abdomen: soft, non-tender; bowel sounds normal; no masses,  no organomegaly Extremities: extremities normal, atraumatic, no cyanosis or edema Pulses: 2+ and symmetric Skin: Skin color, texture, turgor normal. No rashes or lesions Lymph nodes: Cervical, supraclavicular, and axillary nodes normal. Neurologic: Alert and oriented X 3, normal strength and tone. Normal symmetric reflexes. Normal coordination and gait    Assessment:    Healthy female exam.      Plan:    [name] KitchenMarland KitchenRolla was seen today for annual exam.  Diagnoses and all orders for this visit:  Routine physical examination  [redacted] weeks gestation of pregnancy  Iron deficiency anemia, unspecified iron deficiency anemia type  continue to stay on pre-natal vitamins. Blood work up to date with GYN.  Stay walking at least 150 minutes a week.   Follow up after pregnancy to get back on mental health medications.   See After Visit Summary for Counseling Recommendations

## 2016-03-20 NOTE — Patient Instructions (Signed)

## 2016-05-12 LAB — OB RESULTS CONSOLE GBS: GBS: NEGATIVE

## 2016-05-26 ENCOUNTER — Encounter (HOSPITAL_COMMUNITY): Payer: Self-pay | Admitting: *Deleted

## 2016-05-26 ENCOUNTER — Telehealth (HOSPITAL_COMMUNITY): Payer: Self-pay | Admitting: *Deleted

## 2016-05-26 NOTE — Telephone Encounter (Signed)
Preadmission screen  

## 2016-05-31 ENCOUNTER — Inpatient Hospital Stay (HOSPITAL_COMMUNITY)
Admission: AD | Admit: 2016-05-31 | Payer: Managed Care, Other (non HMO) | Source: Ambulatory Visit | Admitting: Obstetrics & Gynecology

## 2016-06-03 ENCOUNTER — Inpatient Hospital Stay (HOSPITAL_COMMUNITY): Payer: Managed Care, Other (non HMO) | Admitting: Anesthesiology

## 2016-06-03 ENCOUNTER — Inpatient Hospital Stay (HOSPITAL_COMMUNITY)
Admission: RE | Admit: 2016-06-03 | Discharge: 2016-06-09 | DRG: 765 | Disposition: A | Discharge status: Home / Self Care | Payer: Managed Care, Other (non HMO) | Source: Ambulatory Visit | Attending: Obstetrics and Gynecology | Admitting: Obstetrics and Gynecology

## 2016-06-03 ENCOUNTER — Encounter (HOSPITAL_COMMUNITY): Payer: Self-pay

## 2016-06-03 VITALS — BP 126/80 | HR 74 | Temp 98.8°F | Resp 18 | Ht 64.0 in | Wt 210.0 lb

## 2016-06-03 DIAGNOSIS — K9189 Other postprocedural complications and disorders of digestive system: Secondary | ICD-10-CM | POA: Diagnosis not present

## 2016-06-03 DIAGNOSIS — O99214 Obesity complicating childbirth: Secondary | ICD-10-CM | POA: Diagnosis present

## 2016-06-03 DIAGNOSIS — Z6836 Body mass index (BMI) 36.0-36.9, adult: Secondary | ICD-10-CM | POA: Diagnosis not present

## 2016-06-03 DIAGNOSIS — O480 Post-term pregnancy: Secondary | ICD-10-CM | POA: Diagnosis present

## 2016-06-03 DIAGNOSIS — K567 Ileus, unspecified: Secondary | ICD-10-CM | POA: Diagnosis not present

## 2016-06-03 DIAGNOSIS — Z87891 Personal history of nicotine dependence: Secondary | ICD-10-CM

## 2016-06-03 DIAGNOSIS — O339 Maternal care for disproportion, unspecified: Secondary | ICD-10-CM | POA: Diagnosis present

## 2016-06-03 DIAGNOSIS — Z3490 Encounter for supervision of normal pregnancy, unspecified, unspecified trimester: Secondary | ICD-10-CM

## 2016-06-03 DIAGNOSIS — Z823 Family history of stroke: Secondary | ICD-10-CM | POA: Diagnosis not present

## 2016-06-03 DIAGNOSIS — Z3493 Encounter for supervision of normal pregnancy, unspecified, third trimester: Secondary | ICD-10-CM

## 2016-06-03 DIAGNOSIS — O9962 Diseases of the digestive system complicating childbirth: Secondary | ICD-10-CM | POA: Diagnosis present

## 2016-06-03 DIAGNOSIS — K219 Gastro-esophageal reflux disease without esophagitis: Secondary | ICD-10-CM | POA: Diagnosis present

## 2016-06-03 DIAGNOSIS — Z3A40 40 weeks gestation of pregnancy: Secondary | ICD-10-CM | POA: Diagnosis not present

## 2016-06-03 LAB — CBC
HCT: 37.6 % (ref 36.0–46.0)
Hemoglobin: 12.8 g/dL (ref 12.0–15.0)
MCH: 29.9 pg (ref 26.0–34.0)
MCHC: 34 g/dL (ref 30.0–36.0)
MCV: 87.9 fL (ref 78.0–100.0)
Platelets: 273 10*3/uL (ref 150–400)
RBC: 4.28 MIL/uL (ref 3.87–5.11)
RDW: 15.6 % — ABNORMAL HIGH (ref 11.5–15.5)
WBC: 11.2 10*3/uL — ABNORMAL HIGH (ref 4.0–10.5)

## 2016-06-03 LAB — TYPE AND SCREEN
ABO/RH(D): O POS
Antibody Screen: NEGATIVE

## 2016-06-03 LAB — ABO/RH: ABO/RH(D): O POS

## 2016-06-03 MED ORDER — PHENYLEPHRINE 40 MCG/ML (10ML) SYRINGE FOR IV PUSH (FOR BLOOD PRESSURE SUPPORT): 80.0000 ug | PREFILLED_SYRINGE | INTRAVENOUS | Status: DC | PRN

## 2016-06-03 MED ORDER — DIPHENHYDRAMINE HCL 50 MG/ML IJ SOLN: 12.5000 mg | INTRAMUSCULAR | Status: DC | PRN

## 2016-06-03 MED ORDER — PHENYLEPHRINE 40 MCG/ML (10ML) SYRINGE FOR IV PUSH (FOR BLOOD PRESSURE SUPPORT)
80.0000 ug | PREFILLED_SYRINGE | INTRAVENOUS | Status: DC | PRN
  Administered 2016-06-03: 80 ug via INTRAVENOUS

## 2016-06-03 MED ORDER — OXYTOCIN 40 UNITS IN LACTATED RINGERS INFUSION - SIMPLE MED
2.5000 [IU]/h | INTRAVENOUS | Status: DC
  Filled 2016-06-03: qty 1000

## 2016-06-03 MED ORDER — EPHEDRINE 5 MG/ML INJ
10.0000 mg | INTRAVENOUS | Status: DC | PRN
  Administered 2016-06-03: 10 mg via INTRAVENOUS

## 2016-06-03 MED ORDER — PHENYLEPHRINE 40 MCG/ML (10ML) SYRINGE FOR IV PUSH (FOR BLOOD PRESSURE SUPPORT)
80.0000 ug | PREFILLED_SYRINGE | INTRAVENOUS | Status: DC | PRN
Start: 2016-06-03 — End: 2016-06-04

## 2016-06-03 MED ORDER — LIDOCAINE HCL (PF) 1 % IJ SOLN: 30.0000 mL | INTRAMUSCULAR | Status: DC | PRN

## 2016-06-03 MED ORDER — MISOPROSTOL 25 MCG QUARTER TABLET
25.0000 ug | ORAL_TABLET | ORAL | Status: DC
  Administered 2016-06-03 (×3): 25 ug via VAGINAL
  Filled 2016-06-03 (×3): qty 0.25

## 2016-06-03 MED ORDER — EPHEDRINE 5 MG/ML INJ: 10.0000 mg | INTRAVENOUS | Status: DC | PRN

## 2016-06-03 MED ORDER — OXYTOCIN BOLUS FROM INFUSION: 500.0000 mL | Freq: Once | INTRAVENOUS | Status: DC

## 2016-06-03 MED ORDER — FENTANYL 2.5 MCG/ML BUPIVACAINE 1/10 % EPIDURAL INFUSION (WH - ANES)
14.0000 mL/h | INTRAMUSCULAR | Status: DC | PRN
  Administered 2016-06-03 – 2016-06-04 (×4): 14 mL/h via EPIDURAL
  Filled 2016-06-03 (×3): qty 100

## 2016-06-03 MED ORDER — LACTATED RINGERS IV SOLN: 500.0000 mL | Freq: Once | INTRAVENOUS | Status: DC

## 2016-06-03 MED ORDER — ACETAMINOPHEN 325 MG PO TABS
650.0000 mg | ORAL_TABLET | ORAL | Status: DC | PRN
  Administered 2016-06-03 (×2): 650 mg via ORAL
  Filled 2016-06-03 (×2): qty 2

## 2016-06-03 MED ORDER — EPHEDRINE 5 MG/ML INJ
10.0000 mg | INTRAVENOUS | Status: DC | PRN
  Filled 2016-06-03: qty 4

## 2016-06-03 MED ORDER — PHENYLEPHRINE 40 MCG/ML (10ML) SYRINGE FOR IV PUSH (FOR BLOOD PRESSURE SUPPORT)
PREFILLED_SYRINGE | INTRAVENOUS | Status: AC
  Administered 2016-06-03: 80 ug via INTRAVENOUS
  Filled 2016-06-03: qty 20

## 2016-06-03 MED ORDER — FENTANYL 2.5 MCG/ML BUPIVACAINE 1/10 % EPIDURAL INFUSION (WH - ANES)
INTRAMUSCULAR | Status: AC
  Filled 2016-06-03: qty 100

## 2016-06-03 MED ORDER — TERBUTALINE SULFATE 1 MG/ML IJ SOLN: 0.2500 mg | Freq: Once | INTRAMUSCULAR | Status: DC | PRN

## 2016-06-03 MED ORDER — OXYTOCIN 40 UNITS IN LACTATED RINGERS INFUSION - SIMPLE MED
1.0000 m[IU]/min | INTRAVENOUS | Status: DC
Start: 2016-06-03 — End: 2016-06-04
  Administered 2016-06-03: 1 m[IU]/min via INTRAVENOUS

## 2016-06-03 MED ORDER — LACTATED RINGERS IV SOLN
INTRAVENOUS | Status: DC
  Administered 2016-06-03 – 2016-06-04 (×2): via INTRAVENOUS
  Administered 2016-06-04: 1000 mL via INTRAVENOUS
  Administered 2016-06-04: 17:00:00 via INTRAVENOUS

## 2016-06-03 MED ORDER — LACTATED RINGERS IV SOLN: 500.0000 mL | INTRAVENOUS | Status: DC | PRN

## 2016-06-03 MED ORDER — ONDANSETRON HCL 4 MG/2ML IJ SOLN
4.0000 mg | Freq: Four times a day (QID) | INTRAMUSCULAR | Status: DC | PRN
  Administered 2016-06-03 – 2016-06-04 (×3): 4 mg via INTRAVENOUS
  Filled 2016-06-03 (×3): qty 2

## 2016-06-03 MED ORDER — LIDOCAINE HCL (PF) 1 % IJ SOLN
INTRAMUSCULAR | Status: DC | PRN
  Administered 2016-06-03: 6 mL
  Administered 2016-06-03: 6 mL via EPIDURAL

## 2016-06-03 MED ORDER — LACTATED RINGERS IV SOLN
500.0000 mL | Freq: Once | INTRAVENOUS | Status: AC
  Administered 2016-06-03: 500 mL via INTRAVENOUS

## 2016-06-03 MED ORDER — SOD CITRATE-CITRIC ACID 500-334 MG/5ML PO SOLN
30.0000 mL | ORAL | Status: DC | PRN
  Administered 2016-06-04: 30 mL via ORAL
  Filled 2016-06-03: qty 15

## 2016-06-03 NOTE — Anesthesia Procedure Notes (Signed)
Epidural Patient location during procedure: OB Start time: 06/03/2016 3:28 PM End time: 06/03/2016 3:51 PM  Staffing Anesthesiologist: Annye Asa Performed: anesthesiologist   Preanesthetic Checklist Completed: patient identified, surgical consent, pre-op evaluation, timeout performed, IV checked, risks and benefits discussed and monitors and equipment checked  Epidural Patient position: sitting Prep: site prepped and draped and DuraPrep Patient monitoring: blood pressure, continuous pulse ox and heart rate Approach: midline Location: L3-L4 Injection technique: LOR air  Needle:  Needle type: Tuohy  Needle gauge: 17 G Needle length: 9 cm Needle insertion depth: 5 cm Catheter type: closed end flexible Catheter size: 19 Gauge Catheter at skin depth: 10.5 cm Test dose: negative (1% lidocaine)  Assessment Events: blood not aspirated, injection not painful, no injection resistance, negative IV test and no paresthesia  Additional Notes Pt identified in Labor room.  Monitors applied. Working IV access confirmed. Sterile prep, drape lumbar spine.  1% lido local L 3,4.  #17ga Touhy LOR air at 5 cm L 3,4, cath in easily to 10.5 cm skin. Test dose OK, cath dosed and infusion begun.  Patient asymptomatic, VSS, no heme aspirated, tolerated well.  Jenita Seashore, MDReason for block:procedure for pain

## 2016-06-03 NOTE — Progress Notes (Signed)
Pt uncomfortable w/ ctx  FHT cat 1 Toco Q2 Cvx 1/60/-2 ballottable  A/P:  Continue exp mngt S/p cytotec.  If ctx space out, start pitocin Epidural prn

## 2016-06-03 NOTE — H&P (Signed)
ALEXZIA VISWANATHAN is a 36 y.o. female presenting for postdates IOL.  No ctx, vb or lof.  S/p cytotec x 2 overnight.  OB History    Gravida Para Term Preterm AB Living   1             SAB TAB Ectopic Multiple Live Births                 Past Medical History:  Diagnosis Date  . History of bipolar disorder   . History of pancreatitis   . Hx of varicella   . Vaginal Pap smear, abnormal    Past Surgical History:  Procedure Laterality Date  . MOUTH SURGERY     Family History: family history includes Cancer in her maternal aunt and maternal grandmother; Stroke in her father. Social History:  reports that she quit smoking about 8 months ago. Her smoking use included Cigarettes. She smoked 0.50 packs per day. She has never used smokeless tobacco. She reports that she drinks about 1.2 oz of alcohol per week . Her drug history is not on file.     Maternal Diabetes: No Genetic Screening: Declined Maternal Ultrasounds/Referrals: Normal Fetal Ultrasounds or other Referrals:  None Maternal Substance Abuse:  No Significant Maternal Medications:  None Significant Maternal Lab Results:  None Other Comments:  None  ROS History Dilation: 1 Effacement (%): 60 Station: Ballotable Exam by:: Dr. Julien Girt Blood pressure 135/85, pulse 90, temperature 97.4 F (36.3 C), temperature source Axillary, resp. rate 18, height 5\' 4"  (1.626 m), weight 210 lb (95.3 kg), last menstrual period 08/29/2015. Exam Physical Exam  Gen - NAD Abd - soft, NT.  EFW 9.5# Ext - NT, no edema Cvx FT/th/-3, ballottable Prenatal labs: ABO, Rh: --/--/O POS, O POS (01/17 0049) Antibody: NEG (01/17 0049) Rubella: Immune (06/14 0000) RPR: Nonreactive (06/14 0000)  HBsAg: Negative (06/14 0000)  HIV: Non-reactive (06/14 0000)  GBS: Negative (12/26 0000)   Assessment/Plan: Continue cytotec IOL   Jakyah Bradby 06/03/2016, 9:25 AM

## 2016-06-03 NOTE — Progress Notes (Signed)
Pt comfortable w/ epidural  FHT reassuring Toco Q5 Cvx 1.5/60/-2, vtx  A/P:  Continue pitocin indxn

## 2016-06-03 NOTE — Anesthesia Pain Management Evaluation Note (Signed)
  [facility] Pain Management Visit Note  Patient: [name], 36 y.o., female  "Hello I am a member of the anesthesia team at [facility]. We have an anesthesia team available at all times to provide care throughout the hospital, including epidural management and anesthesia for C-section. I don't know your plan for the delivery whether it a natural birth, water birth, IV sedation, nitrous supplementation, doula or epidural, but we want to meet your pain goals."   1.Was your pain managed to your expectations on prior hospitalizations?   No prior hospitalizations  2.What is your expectation for pain management during this hospitalization?     Epidural  3.How can we help you reach that goal? Epidural, when ready.  Record the patient's initial score and the patient's pain goal.   Pain: 5  Pain Goal: 7 [facility] wants you to be able to say your pain was always managed very well.  [name] L 06/03/2016

## 2016-06-03 NOTE — Anesthesia Preprocedure Evaluation (Signed)
Anesthesia Evaluation  Patient identified by MRN, date of birth, ID band Patient awake    Reviewed: Allergy & Precautions, NPO status , Patient's Chart, lab work & pertinent test results  History of Anesthesia Complications Negative for: history of anesthetic complications  Airway Mallampati: II  TM Distance: >3 FB Neck ROM: Full    Dental  (+) Dental Advisory Given   Pulmonary former smoker,    breath sounds clear to auscultation       Cardiovascular negative cardio ROS   Rhythm:Regular Rate:Normal     Neuro/Psych Depression Bipolar Disorder negative neurological ROS     GI/Hepatic GERD  ,H/o pancreatitis   Endo/Other  Morbid obesity  Renal/GU negative Renal ROS     Musculoskeletal   Abdominal (+) + obese,   Peds  Hematology plt 273K    Anesthesia Other Findings   Reproductive/Obstetrics (+) Pregnancy                             Anesthesia Physical Anesthesia Plan  ASA: II  Anesthesia Plan: Epidural   Post-op Pain Management:    Induction:   Airway Management Planned: Natural Airway  Additional Equipment:   Intra-op Plan:   Post-operative Plan:   Informed Consent: I have reviewed the patients History and Physical, chart, labs and discussed the procedure including the risks, benefits and alternatives for the proposed anesthesia with the patient or authorized representative who has indicated his/her understanding and acceptance.     Plan Discussed with:   Anesthesia Plan Comments: (Patient identified. Risks/Benefits/Options discussed with patient including but not limited to bleeding, infection, nerve damage, paralysis, failed block, incomplete pain control, headache, blood pressure changes, nausea, vomiting, reactions to medication both or allergic, itching and postpartum back pain. Confirmed with bedside nurse the patient's most recent platelet count. Confirmed with  patient that they are not currently taking any anticoagulation, have any bleeding history or any family history of bleeding disorders. Patient expressed understanding and wished to proceed. All questions were answered. )        Anesthesia Quick Evaluation

## 2016-06-03 NOTE — Progress Notes (Signed)
Pt comfortable w/ epidural  FHT - small decel after epidural, reassuring now Toco Q3-4 Cvx 1/60/-3, ballotable  A/P:  Continue induction w pitocin

## 2016-06-04 ENCOUNTER — Encounter (HOSPITAL_COMMUNITY)
Admission: RE | Disposition: A | Discharge status: Home / Self Care | Payer: Self-pay | Source: Ambulatory Visit | Attending: Obstetrics and Gynecology

## 2016-06-04 ENCOUNTER — Encounter (HOSPITAL_COMMUNITY): Payer: Self-pay

## 2016-06-04 LAB — RPR: RPR Ser Ql: NONREACTIVE

## 2016-06-04 SURGERY — Surgical Case
Anesthesia: Epidural | Site: Abdomen | Wound class: Clean Contaminated

## 2016-06-04 MED ORDER — NALBUPHINE HCL 10 MG/ML IJ SOLN: 5.0000 mg | Freq: Once | INTRAMUSCULAR | Status: DC | PRN

## 2016-06-04 MED ORDER — ACETAMINOPHEN 325 MG PO TABS
650.0000 mg | ORAL_TABLET | ORAL | Status: DC | PRN
  Administered 2016-06-05: 650 mg via ORAL
  Filled 2016-06-04: qty 2

## 2016-06-04 MED ORDER — SODIUM BICARBONATE 8.4 % IV SOLN
INTRAVENOUS | Status: DC | PRN
  Administered 2016-06-04 (×3): 5 mL via EPIDURAL

## 2016-06-04 MED ORDER — OXYTOCIN 40 UNITS IN LACTATED RINGERS INFUSION - SIMPLE MED: 2.5000 [IU]/h | INTRAVENOUS | Status: AC

## 2016-06-04 MED ORDER — WITCH HAZEL-GLYCERIN EX PADS: 1.0000 "application " | MEDICATED_PAD | CUTANEOUS | Status: DC | PRN

## 2016-06-04 MED ORDER — LIDOCAINE-EPINEPHRINE (PF) 2 %-1:200000 IJ SOLN
INTRAMUSCULAR | Status: AC
  Filled 2016-06-04: qty 20

## 2016-06-04 MED ORDER — SIMETHICONE 80 MG PO CHEW: 80.0000 mg | CHEWABLE_TABLET | ORAL | Status: DC | PRN

## 2016-06-04 MED ORDER — OXYTOCIN 10 UNIT/ML IJ SOLN
INTRAMUSCULAR | Status: DC | PRN
  Administered 2016-06-04: 40 [IU] via INTRAVENOUS

## 2016-06-04 MED ORDER — DIPHENHYDRAMINE HCL 50 MG/ML IJ SOLN: 12.5000 mg | INTRAMUSCULAR | Status: DC | PRN

## 2016-06-04 MED ORDER — NALOXONE HCL 2 MG/2ML IJ SOSY
1.0000 ug/kg/h | PREFILLED_SYRINGE | INTRAVENOUS | Status: DC | PRN
  Filled 2016-06-04: qty 2

## 2016-06-04 MED ORDER — COCONUT OIL OIL: 1.0000 "application " | TOPICAL_OIL | Status: DC | PRN

## 2016-06-04 MED ORDER — FENTANYL CITRATE (PF) 100 MCG/2ML IJ SOLN
INTRAMUSCULAR | Status: DC | PRN
  Administered 2016-06-04: 50 ug via INTRAVENOUS
  Administered 2016-06-04: 100 ug via EPIDURAL
  Administered 2016-06-04: 50 ug via INTRAVENOUS

## 2016-06-04 MED ORDER — CEFAZOLIN SODIUM-DEXTROSE 2-3 GM-% IV SOLR
INTRAVENOUS | Status: DC | PRN
  Administered 2016-06-04: 2 g via INTRAVENOUS

## 2016-06-04 MED ORDER — ONDANSETRON HCL 4 MG/2ML IJ SOLN
INTRAMUSCULAR | Status: AC
  Filled 2016-06-04: qty 2

## 2016-06-04 MED ORDER — SODIUM CHLORIDE 0.9% FLUSH: 3.0000 mL | INTRAVENOUS | Status: DC | PRN

## 2016-06-04 MED ORDER — NALBUPHINE HCL 10 MG/ML IJ SOLN: 5.0000 mg | INTRAMUSCULAR | Status: DC | PRN

## 2016-06-04 MED ORDER — LACTATED RINGERS IV SOLN: INTRAVENOUS | Status: DC

## 2016-06-04 MED ORDER — KETOROLAC TROMETHAMINE 30 MG/ML IJ SOLN
30.0000 mg | Freq: Four times a day (QID) | INTRAMUSCULAR | Status: AC | PRN
  Administered 2016-06-05: 30 mg via INTRAVENOUS
  Filled 2016-06-04: qty 1

## 2016-06-04 MED ORDER — MORPHINE SULFATE-NACL 0.5-0.9 MG/ML-% IV SOSY
PREFILLED_SYRINGE | INTRAVENOUS | Status: DC | PRN
  Administered 2016-06-04: 1 mg via INTRAVENOUS

## 2016-06-04 MED ORDER — MEPERIDINE HCL 25 MG/ML IJ SOLN
6.2500 mg | INTRAMUSCULAR | Status: DC | PRN
Start: 2016-06-04 — End: 2016-06-04

## 2016-06-04 MED ORDER — DIPHENHYDRAMINE HCL 25 MG PO CAPS: 25.0000 mg | ORAL_CAPSULE | Freq: Four times a day (QID) | ORAL | Status: DC | PRN

## 2016-06-04 MED ORDER — MENTHOL 3 MG MT LOZG: 1.0000 | LOZENGE | OROMUCOSAL | Status: DC | PRN

## 2016-06-04 MED ORDER — BUPIVACAINE HCL (PF) 0.25 % IJ SOLN
INTRAMUSCULAR | Status: DC | PRN
  Administered 2016-06-04: 8 mL via EPIDURAL

## 2016-06-04 MED ORDER — ZOLPIDEM TARTRATE 5 MG PO TABS: 5.0000 mg | ORAL_TABLET | Freq: Every evening | ORAL | Status: DC | PRN

## 2016-06-04 MED ORDER — SCOPOLAMINE 1 MG/3DAYS TD PT72
MEDICATED_PATCH | TRANSDERMAL | Status: AC
  Filled 2016-06-04: qty 1

## 2016-06-04 MED ORDER — PHENYLEPHRINE HCL 10 MG/ML IJ SOLN
INTRAMUSCULAR | Status: DC | PRN
  Administered 2016-06-04 (×4): 80 ug via INTRAVENOUS

## 2016-06-04 MED ORDER — PHENYLEPHRINE 40 MCG/ML (10ML) SYRINGE FOR IV PUSH (FOR BLOOD PRESSURE SUPPORT)
PREFILLED_SYRINGE | INTRAVENOUS | Status: AC
  Filled 2016-06-04: qty 10

## 2016-06-04 MED ORDER — MORPHINE SULFATE (PF) 0.5 MG/ML IJ SOLN
INTRAMUSCULAR | Status: DC | PRN
  Administered 2016-06-04: 4 mg via EPIDURAL

## 2016-06-04 MED ORDER — KETOROLAC TROMETHAMINE 30 MG/ML IJ SOLN
30.0000 mg | Freq: Four times a day (QID) | INTRAMUSCULAR | Status: AC | PRN
  Administered 2016-06-04: 30 mg via INTRAMUSCULAR

## 2016-06-04 MED ORDER — ONDANSETRON HCL 4 MG/2ML IJ SOLN
4.0000 mg | Freq: Three times a day (TID) | INTRAMUSCULAR | Status: DC | PRN
  Administered 2016-06-06 (×2): 4 mg via INTRAVENOUS
  Filled 2016-06-04 (×2): qty 2

## 2016-06-04 MED ORDER — OXYTOCIN 10 UNIT/ML IJ SOLN
INTRAMUSCULAR | Status: AC
  Filled 2016-06-04: qty 4

## 2016-06-04 MED ORDER — METOCLOPRAMIDE HCL 5 MG/ML IJ SOLN: 10.0000 mg | Freq: Once | INTRAMUSCULAR | Status: DC | PRN

## 2016-06-04 MED ORDER — NALBUPHINE HCL 10 MG/ML IJ SOLN
5.0000 mg | INTRAMUSCULAR | Status: DC | PRN
  Administered 2016-06-04: 5 mg via INTRAVENOUS
  Filled 2016-06-04: qty 1

## 2016-06-04 MED ORDER — SENNOSIDES-DOCUSATE SODIUM 8.6-50 MG PO TABS
2.0000 | ORAL_TABLET | ORAL | Status: DC
  Administered 2016-06-04 – 2016-06-09 (×4): 2 via ORAL
  Filled 2016-06-04 (×4): qty 2

## 2016-06-04 MED ORDER — SIMETHICONE 80 MG PO CHEW
80.0000 mg | CHEWABLE_TABLET | Freq: Three times a day (TID) | ORAL | Status: DC
  Administered 2016-06-05 – 2016-06-09 (×11): 80 mg via ORAL
  Filled 2016-06-04 (×11): qty 1

## 2016-06-04 MED ORDER — NALOXONE HCL 0.4 MG/ML IJ SOLN: 0.4000 mg | INTRAMUSCULAR | Status: DC | PRN

## 2016-06-04 MED ORDER — FENTANYL CITRATE (PF) 100 MCG/2ML IJ SOLN
INTRAMUSCULAR | Status: AC
Start: 2016-06-04 — End: 2016-06-04
  Filled 2016-06-04: qty 2

## 2016-06-04 MED ORDER — SIMETHICONE 80 MG PO CHEW
80.0000 mg | CHEWABLE_TABLET | ORAL | Status: DC
  Administered 2016-06-04 – 2016-06-06 (×3): 80 mg via ORAL
  Filled 2016-06-04 (×3): qty 1

## 2016-06-04 MED ORDER — DIPHENHYDRAMINE HCL 25 MG PO CAPS
25.0000 mg | ORAL_CAPSULE | ORAL | Status: DC | PRN
  Filled 2016-06-04: qty 1

## 2016-06-04 MED ORDER — FENTANYL CITRATE (PF) 100 MCG/2ML IJ SOLN
INTRAMUSCULAR | Status: AC
  Filled 2016-06-04: qty 2

## 2016-06-04 MED ORDER — LACTATED RINGERS IV SOLN
INTRAVENOUS | Status: DC
  Administered 2016-06-05: 05:00:00 via INTRAVENOUS

## 2016-06-04 MED ORDER — FENTANYL CITRATE (PF) 100 MCG/2ML IJ SOLN: 25.0000 ug | INTRAMUSCULAR | Status: DC | PRN

## 2016-06-04 MED ORDER — KETOROLAC TROMETHAMINE 30 MG/ML IJ SOLN
INTRAMUSCULAR | Status: AC
  Administered 2016-06-04: 30 mg via INTRAMUSCULAR
  Filled 2016-06-04: qty 1

## 2016-06-04 MED ORDER — DIBUCAINE 1 % RE OINT: 1.0000 "application " | TOPICAL_OINTMENT | RECTAL | Status: DC | PRN

## 2016-06-04 MED ORDER — IBUPROFEN 600 MG PO TABS: 600.0000 mg | ORAL_TABLET | Freq: Four times a day (QID) | ORAL | Status: DC

## 2016-06-04 MED ORDER — PRENATAL MULTIVITAMIN CH
1.0000 | ORAL_TABLET | Freq: Every day | ORAL | Status: DC
  Administered 2016-06-05: 1 via ORAL
  Filled 2016-06-04: qty 1

## 2016-06-04 MED ORDER — ONDANSETRON HCL 4 MG/2ML IJ SOLN
INTRAMUSCULAR | Status: DC | PRN
  Administered 2016-06-04: 4 mg via INTRAVENOUS

## 2016-06-04 MED ORDER — SCOPOLAMINE 1 MG/3DAYS TD PT72: 1.0000 | MEDICATED_PATCH | Freq: Once | TRANSDERMAL | Status: DC

## 2016-06-04 MED ORDER — CEFAZOLIN SODIUM-DEXTROSE 2-4 GM/100ML-% IV SOLN
INTRAVENOUS | Status: AC
  Filled 2016-06-04: qty 100

## 2016-06-04 MED ORDER — SODIUM BICARBONATE 8.4 % IV SOLN
INTRAVENOUS | Status: AC
  Filled 2016-06-04: qty 50

## 2016-06-04 MED ORDER — TETANUS-DIPHTH-ACELL PERTUSSIS 5-2.5-18.5 LF-MCG/0.5 IM SUSP: 0.5000 mL | Freq: Once | INTRAMUSCULAR | Status: DC

## 2016-06-04 MED ORDER — MORPHINE SULFATE (PF) 0.5 MG/ML IJ SOLN
INTRAMUSCULAR | Status: AC
  Filled 2016-06-04: qty 10

## 2016-06-04 SURGICAL SUPPLY — 37 items
BENZOIN TINCTURE PRP APPL 2/3 (GAUZE/BANDAGES/DRESSINGS) ×3 IMPLANT
CHLORAPREP W/TINT 26ML (MISCELLANEOUS) ×3 IMPLANT
CLAMP CORD UMBIL (MISCELLANEOUS) IMPLANT
CLOSURE WOUND 1/2 X4 (GAUZE/BANDAGES/DRESSINGS) ×1
CLOTH BEACON ORANGE TIMEOUT ST (SAFETY) ×3 IMPLANT
DERMABOND ADVANCED (GAUZE/BANDAGES/DRESSINGS) ×2
DERMABOND ADVANCED .7 DNX12 (GAUZE/BANDAGES/DRESSINGS) ×1 IMPLANT
DRSG OPSITE POSTOP 4X10 (GAUZE/BANDAGES/DRESSINGS) ×3 IMPLANT
ELECT REM PT RETURN 9FT ADLT (ELECTROSURGICAL) ×3
ELECTRODE REM PT RTRN 9FT ADLT (ELECTROSURGICAL) ×1 IMPLANT
EXTRACTOR VACUUM KIWI (MISCELLANEOUS) IMPLANT
GLOVE BIO SURGEON STRL SZ 6.5 (GLOVE) ×2 IMPLANT
GLOVE BIO SURGEONS STRL SZ 6.5 (GLOVE) ×1
GLOVE BIOGEL PI IND STRL 6.5 (GLOVE) ×1 IMPLANT
GLOVE BIOGEL PI IND STRL 7.0 (GLOVE) ×2 IMPLANT
GLOVE BIOGEL PI INDICATOR 6.5 (GLOVE) ×2
GLOVE BIOGEL PI INDICATOR 7.0 (GLOVE) ×4
GOWN STRL REUS W/TWL LRG LVL3 (GOWN DISPOSABLE) ×6 IMPLANT
KIT ABG SYR 3ML LUER SLIP (SYRINGE) ×3 IMPLANT
NEEDLE HYPO 25X5/8 SAFETYGLIDE (NEEDLE) ×3 IMPLANT
NS IRRIG 1000ML POUR BTL (IV SOLUTION) ×3 IMPLANT
PACK C SECTION WH (CUSTOM PROCEDURE TRAY) ×3 IMPLANT
PAD OB MATERNITY 4.3X12.25 (PERSONAL CARE ITEMS) ×3 IMPLANT
PENCIL SMOKE EVAC W/HOLSTER (ELECTROSURGICAL) ×3 IMPLANT
RETRACTOR WND ALEXIS 25 LRG (MISCELLANEOUS) IMPLANT
RTRCTR WOUND ALEXIS 25CM LRG (MISCELLANEOUS)
STRIP CLOSURE SKIN 1/2X4 (GAUZE/BANDAGES/DRESSINGS) ×2 IMPLANT
SUT PLAIN 0 NONE (SUTURE) IMPLANT
SUT PLAIN 2 0 (SUTURE) ×2
SUT PLAIN 2 0 XLH (SUTURE) ×3 IMPLANT
SUT PLAIN ABS 2-0 CT1 27XMFL (SUTURE) ×1 IMPLANT
SUT VIC AB 0 CT1 36 (SUTURE) ×6 IMPLANT
SUT VIC AB 0 CTX 36 (SUTURE) ×6
SUT VIC AB 0 CTX36XBRD ANBCTRL (SUTURE) ×3 IMPLANT
SUT VIC AB 4-0 KS 27 (SUTURE) ×3 IMPLANT
TOWEL OR 17X24 6PK STRL BLUE (TOWEL DISPOSABLE) ×3 IMPLANT
TRAY FOLEY CATH SILVER 14FR (SET/KITS/TRAYS/PACK) IMPLANT

## 2016-06-04 NOTE — Progress Notes (Signed)
CRNA in room admin ex epidural med

## 2016-06-04 NOTE — Progress Notes (Signed)
This note also relates to the following rows which could not be included: BP - Cannot attach notes to unvalidated device data Pulse Rate - Cannot attach notes to unvalidated device data  Called Dr Carolann Littler coming to increase epidural.

## 2016-06-04 NOTE — Op Note (Signed)
PROCEDURE DATE: 06/04/16  PREOPERATIVE DIAGNOSIS: Intrauterine pregnancy at 40.4 wga, Indication: arrest of dilation  POSTOPERATIVE DIAGNOSIS:The same  PROCEDURE: Primary Low TransverseCesarean Section  SURGEON: Dr. Lucillie Garfinkel  INDICATIONS:This is a 36yo G1 at 40.4 wga requiring cesarean section secondary to arrest of dilation. She was admitted for a pdIOL. Ultimately progressed to 5cm but remained there for 10 hours. Decision made to proceed with pLTCS.The risks of cesarean section discussed with the patient included but were not limited to: bleeding which may require transfusion or reoperation; infection which may require antibiotics; injury to bowel, bladder, ureters or other surrounding organs; injury to the fetus; need for additional procedures including hysterectomy in the event of a life-threatening hemorrhage; placental abnormalities wth subsequent pregnancies, incisional problems, thromboembolic phenomenon and other postoperative/anesthesia complications. The patient agreed with the proposed plan, giving informed consent for the procedure.   FINDINGS: Viable femaleinfant in vertex presentation,APGARs pending, Weight pending, Amniotic fluid pending, Intact placenta, three vessel cord. Grossly normal uterus, ovaries and fallopian tubes. .  ANESTHESIA: Epidural ESTIMATED BLOOD LOSS: 850cc SPECIMENS: Placenta for pathology COMPLICATIONS: None immediate  PROCEDURE IN DETAIL: The patient received intravenous antibiotics (2g Ancef) and had sequential compression devices applied to her lower extremities while in the preoperative area. Shewasthen taken to the operating roomwhere epidural anesthesiawas dosed up to surgical level andwas found to be adequate. She was then placed in a dorsal supine position with a leftward tilt,and prepped and draped in a sterile manner.A foley catheter was placed into her bladder and attached to constant gravity. After an  adequate timeout was performed, aPfannenstiel skin incision was made with scalpel and carried through to the underlying layer of fascia. The fascia was incised in the midline and this incision was extended bilaterally using the Mayo scissors. Kocher clamps were applied to the superior aspect of the fascial incision and the underlying rectus muscles were dissected off bluntly. A similar process was carried out on the inferior aspect of the facial incision. The rectus muscles were separated in the midline bluntly and the peritoneum was entered bluntly. A bladder flap was created sharply and developed bluntly.Atransverse hysterotomy was made with a scalpel and extended bilaterally bluntly. The bladder blade was then removed. The infant was successfully delivered, and cord was clamped and cut and infant was handed over to awaiting neonatology team. Uterine massage was then administered and the placenta delivered intact with three-vessel cord. Cord gases were taken. The uterus was cleared of clot and debris. The hysterotomy was closed with 0 vicryl.A second imbricating suture of 0-vicryl was used to reinforce the incision and aid in hemostasis.The fascia was closed with 0-Vicryl in a running fashion using two sutures with good restoration of anatomy. The subcutaneus tissue was irrigated and was reapproximated using three interrupted plain gut stitches. The skin was closed with 4-0 Vicryl in a subcuticular fashion.  Final EBL was 850cc (all surgical site and was hemostatic at end of procedure) without any further bleeding on exam.   Pt tolerated the procedure well. All sponge/lap/needle counts were correct X 2. Pt taken to recovery room in stable condition.  It's a girl - "Charlie"!!  Lucillie Garfinkel MD

## 2016-06-04 NOTE — Progress Notes (Signed)
Re-SVE unchanged - 5cm, cervix w/mild swelling, head still -2 and asynclitic. IUPC w/ctxn pattern not yet but bordering on adequate - ~160-180 MVUs. Would expect some descent of head. Have changed positions mutliple times - will now try high fowlers to allow gravity to help. At this time, cat 1 tracing and mom is Af. Will continue IOL. Discussed that if no change by next exam, would consider pCS. Patient and family in agreement with this plan.    Arty Baumgartner MD

## 2016-06-04 NOTE — Progress Notes (Addendum)
Took over patient's care @ 0730. She is w/out complaints. SVE 5/0.5/-2, AROM for thick meconium. IUPC placed. EFW 9#. Cat 2 tracing for occ late decels - overall reassuring with good variability and + scalp stim. Cont pitocin.   Arty Baumgartner MD

## 2016-06-04 NOTE — Progress Notes (Signed)
Dr Azalee Course in room checking epidural, no changes made at this time

## 2016-06-04 NOTE — Transfer of Care (Signed)
Immediate Anesthesia Transfer of Care Note  Patient: [name]  Procedure(s) Performed: Procedure(s): CESAREAN SECTION (N/A)  Patient Location: PACU  Anesthesia Type:Epidural  Level of Consciousness: awake, alert  and oriented  Airway & Oxygen Therapy: Patient Spontanous Breathing  Post-op Assessment: Report given to RN and Post -op Vital signs reviewed and stable  Post vital signs: Reviewed and stable  Last Vitals:  Vitals:   06/04/16 1501 06/04/16 1631  BP: 127/82 120/71  Pulse: 89 (!) 161  Resp: 18   Temp: 37.4 C 37 C    Last Pain:  Vitals:   06/04/16 1631  TempSrc: Oral  PainSc: 0-No pain         Complications: No apparent anesthesia complications

## 2016-06-04 NOTE — Brief Op Note (Signed)
06/03/2016 - 06/04/2016  6:05 PM  PATIENT:  [name]  36 y.o. female  PRE-OPERATIVE DIAGNOSIS:  failure to progress  POST-OPERATIVE DIAGNOSIS:  failure to progress  PROCEDURE:  Procedure(s): CESAREAN SECTION (N/A)  SURGEON:  Surgeon(s) and Role:    * Tyson Dense, MD - Primary  PHYSICIAN ASSISTANT:   ASSISTANTS: Maida Sale RN   ANESTHESIA:   general  EBL:  Total I/O In: 1500 [I.V.:1500] Out: 1975 [Urine:1125; Blood:850]  BLOOD ADMINISTERED:none  DRAINS: Urinary Catheter (Foley)   LOCAL MEDICATIONS USED:  NONE  SPECIMEN:  Source of Specimen:  placenta  DISPOSITION OF SPECIMEN:  PATHOLOGY  COUNTS:  YES  TOURNIQUET:  * No tourniquets in log *  DICTATION: .Note written in EPIC  PLAN OF CARE: Admit to inpatient   PATIENT DISPOSITION:  PACU - hemodynamically stable.   Delay start of Pharmacological VTE agent (>24hrs) due to surgical blood loss or risk of bleeding: not applicable

## 2016-06-04 NOTE — Progress Notes (Signed)
SVE remains unchanged - now x ~10 hours. Contractions remain borderline - not quite adequate. Patient frustrated. Offered pLTCS for arrest of dilation vs continuing with IOL. Desires to proceed with pLTCS. Risks discussed including infection, bleeding, damage to surrounding structures, the need for additional procedures, and hysterectomy. Patient agrees and consent was signed. Ancef 2g on call to Or.   Arty Baumgartner MD

## 2016-06-04 NOTE — Progress Notes (Signed)
Order from Dr Royston Sinner to go ahead with zofran

## 2016-06-05 ENCOUNTER — Encounter (HOSPITAL_COMMUNITY): Payer: Self-pay

## 2016-06-05 LAB — CBC
HCT: 29.6 % — ABNORMAL LOW (ref 36.0–46.0)
Hemoglobin: 10.2 g/dL — ABNORMAL LOW (ref 12.0–15.0)
MCH: 30.5 pg (ref 26.0–34.0)
MCHC: 34.5 g/dL (ref 30.0–36.0)
MCV: 88.6 fL (ref 78.0–100.0)
Platelets: 188 10*3/uL (ref 150–400)
RBC: 3.34 MIL/uL — ABNORMAL LOW (ref 3.87–5.11)
RDW: 15.6 % — ABNORMAL HIGH (ref 11.5–15.5)
WBC: 14.5 10*3/uL — ABNORMAL HIGH (ref 4.0–10.5)

## 2016-06-05 MED ORDER — ACETAMINOPHEN 500 MG PO TABS
1000.0000 mg | ORAL_TABLET | Freq: Four times a day (QID) | ORAL | Status: DC | PRN
  Administered 2016-06-05 – 2016-06-07 (×8): 1000 mg via ORAL
  Filled 2016-06-05 (×8): qty 2

## 2016-06-05 MED ORDER — TRAMADOL HCL 50 MG PO TABS
50.0000 mg | ORAL_TABLET | Freq: Four times a day (QID) | ORAL | Status: DC | PRN
  Administered 2016-06-05 – 2016-06-07 (×6): 50 mg via ORAL
  Filled 2016-06-05 (×6): qty 1

## 2016-06-05 NOTE — Anesthesia Postprocedure Evaluation (Addendum)
Anesthesia Post Note  Patient: [name]  Procedure(s) Performed: Procedure(s) (LRB): CESAREAN SECTION (N/A)  Patient location during evaluation: Mother Baby Anesthesia Type: Epidural Level of consciousness: awake Pain management: satisfactory to patient Vital Signs Assessment: post-procedure vital signs reviewed and stable Respiratory status: spontaneous breathing Cardiovascular status: stable Anesthetic complications: no        Last Vitals:  Vitals:   06/05/16 0245 06/05/16 0645  BP: (!) 112/58 99/64  Pulse: 74 65  Resp: 18 18  Temp: 36.4 C 36.6 C    Last Pain:  Vitals:   06/05/16 0645  TempSrc: Oral  PainSc: 3    Pain Goal: Patients Stated Pain Goal: 3 (06/05/16 0645)               Casimer Lanius

## 2016-06-05 NOTE — Lactation Note (Signed)
This note was copied from a baby's chart. Lactation Consultation Note New mom holding baby in cradle position trying to latch. Discussed different positions to BF. Mom has pendulum soft large breast w/flat nipples. Very compressible. Mom picking areola up in finger tips. Hand expression w/esy flow of colostrum,Hand expression taught to Mom.  Expressed 55ml in a couple of minutes. Mom just recently decided to BF instead of formula feed. Mom was very surprised at the amount it was.  Assisted in football position. Baby crying pushing away frustrated and hungry. Once got baby latched and baby calmed down. Baby BF great. A lot of teaching and questions asked during that time.  Mom encouraged to feed baby 8-12 times/24 hours and with feeding cues.  Educated newborn behavior, I&O, supply and demand. Discussed supply and demand, engorgement, and filling.  [place] brochure given w/resources, support groups and [place] services. Patient Name: [name] [id number] Date: 06/05/2016 Reason for consult: Initial assessment   Maternal Data    Feeding Feeding Type: Breast Fed Length of feed: 20 min (still BF)  LATCH Score/Interventions Latch: Repeated attempts needed to sustain latch, nipple held in mouth throughout feeding, stimulation needed to elicit sucking reflex. Intervention(s): Skin to skin;Teach feeding cues;Waking techniques Intervention(s): Adjust position;Assist with latch;Breast massage;Breast compression  Audible Swallowing: Spontaneous and intermittent Intervention(s): Skin to skin Intervention(s): Skin to skin;Hand expression;Alternate breast massage  Type of Nipple: Flat  Comfort (Breast/Nipple): Soft / non-tender     Hold (Positioning): Assistance needed to correctly position infant at breast and maintain latch. Intervention(s): Breastfeeding basics reviewed;Support Pillows;Position options;Skin to skin  LATCH Score: 7  Lactation Tools Discussed/Used WIC Program: No   Consult  Status Consult Status: Follow-up Date: 06/05/16 Follow-up type: In-patient    Theodoro Kalata 06/05/2016, 4:45 AM

## 2016-06-05 NOTE — Clinical Social Work Maternal (Signed)
CLINICAL SOCIAL WORK MATERNAL/CHILD NOTE  Patient Details  Name: [name] MRN: [id number] Date of Birth: [birth year]  Date:  06/05/2016  Clinical Social Worker Initiating Note:  Terri Piedra, [place] Date/ Time Initiated:  06/05/16/1500     Child's Name:  Ferol Luz   Legal Guardian:  Other (Comment) Truman Hayward and Sindy Messing)   Need for Interpreter:  None   Date of Referral:  06/05/16     Reason for Referral:  Behavioral Health Issues, including SI    Referral Source:  [facility]   Address:  [address] [place], [place] [address]  Phone number:  [phone]   Household Members:  Significant Other (FOB has a 44 year old son who lives in the home part of the time.)   Natural Supports (not living in the home):  Friends, Immediate Family, Extended Family   Professional Supports: None   Employment:     Type of Work: MOB works at [employer] in [place].  FOB works for [employer] in [place].   Education:      Pensions consultant:  Multimedia programmer   Other Resources:      Cultural/Religious Considerations Which May Impact Care: None stated.  MOB's facesheet notes religion as Nurse, learning disability.  Strengths:  Ability to meet basic needs , Home prepared for child , Pediatrician chosen  (Pediatric follow up will be with Dr. Gilford Rile)   Risk Factors/Current Problems:  Mental Health Concerns  (Bipolar)   Cognitive State:  Able to Concentrate , Alert , Insightful , Goal Oriented , Linear Thinking    Mood/Affect:  Euthymic , Calm , Comfortable , Interested    CSW Assessment: CSW met MOB in her first floor room/144 to offer support and complete assessment due to hx of mental illness (chart notes dx of Borderline Personality, Anxiety and Bipolar).  MOB introduced her visitor as her boyfriend and stated that we could talk openly with him present.  MOB was pleasant and easy to engage.  FOB was quiet and participated minimally in the conversation.    MOB reports she and baby are doing well.  She appears to be in good spirits.  Bonding is evident in the way she spoke about baby and tended to baby while CSW was in the room.  She reports that she has a great support system and everything she needs for baby at home.  CSW reviewed SIDS precautions, which MOB states she is aware.   MOB was very open about her mental health hx and states she has been diagnosed with Bipolar.  She reports that she went off of her medication when she found out she was pregnant, and that it was "rough."  She states she felt very depressed during pregnancy.  She is very open to restarting medications, but did not feel comfortable taking medications while pregnant.  She wishes to wait to see how she feels moving forward since she is planning to breast feed.  CSW discussed the importance of maternal mental health above everything else.  MOB agreed completely and states she will be open and honest with her doctor about her emotions during the postpartum period, realizing that it is a fragile time and she is at heightened risk of developing PMADs given her hx.  She also understands that medication takes 4-6 weeks to reach a therapeutic level in the body.  She has had counseling in the past and did not feel it was very beneficial.  She is willing to seek counseling again  in the future if she feels it is warranted.  It sounds like MOB has a great relationship with her primary care doctor and MOB was receptive to education and resources regarding perinatal mood disorders.  CSW provided her with a "New Mom Checklist" from Postpartum Progress as a self-evaluation tool and informed her of support groups held at [facility] for her openness in sharing about her mental health and for her level of awareness in her symptoms as well as the importance of treatment when needed.  MOB seemed appreciative of CSW's concern for her emotional wellbeing and states no questions, concerns  or needs at this time.      CSW Plan/Description:  No Further Intervention Required/No Barriers to Discharge, Information/Referral to [employer] , Patient/Family Education     Terri Piedra [place], [place] 06/05/2016, 3:30 PM

## 2016-06-05 NOTE — Progress Notes (Signed)
Subjective: Postpartum Day 1: Cesarean Delivery Patient reports nausea last pm, OK now. No flatus yet.    Objective: Vital signs in last 24 hours: Temp:  [97.5 F (36.4 C)-99.3 F (37.4 C)] 97.8 F (36.6 C) (01/19 0645) Pulse Rate:  [65-161] 65 (01/19 0645) Resp:  [16-23] 18 (01/19 0645) BP: (99-131)/(58-94) 99/64 (01/19 0645) SpO2:  [96 %-99 %] 99 % (01/19 0645)  Physical Exam:  General: alert, cooperative and no distress Lochia: appropriate Uterine Fundus: firm Incision: healing well DVT Evaluation: No evidence of DVT seen on physical exam. Abdomen moderate distention, soft, BS+   Recent Labs  06/03/16 0049 06/05/16 0528  HGB 12.8 10.2*  HCT 37.6 29.6*    Assessment/Plan: Status post Cesarean section. Doing well postoperatively.  Continue current care. Advance po slowly, D/W patient Emonte Dieujuste II,Dorrine Montone E 06/05/2016, 8:27 AM

## 2016-06-06 LAB — CBC
HCT: 30.4 % — ABNORMAL LOW (ref 36.0–46.0)
Hemoglobin: 10.3 g/dL — ABNORMAL LOW (ref 12.0–15.0)
MCH: 30.5 pg (ref 26.0–34.0)
MCHC: 33.9 g/dL (ref 30.0–36.0)
MCV: 89.9 fL (ref 78.0–100.0)
Platelets: 225 10*3/uL (ref 150–400)
RBC: 3.38 MIL/uL — ABNORMAL LOW (ref 3.87–5.11)
RDW: 15.8 % — ABNORMAL HIGH (ref 11.5–15.5)
WBC: 12.9 10*3/uL — ABNORMAL HIGH (ref 4.0–10.5)

## 2016-06-06 MED ORDER — ONDANSETRON HCL 4 MG PO TABS
4.0000 mg | ORAL_TABLET | Freq: Four times a day (QID) | ORAL | Status: DC | PRN
  Administered 2016-06-07 (×2): 4 mg via ORAL
  Filled 2016-06-06 (×2): qty 1

## 2016-06-06 MED ORDER — FAMOTIDINE 20 MG PO TABS
20.0000 mg | ORAL_TABLET | Freq: Two times a day (BID) | ORAL | Status: DC
  Administered 2016-06-06 – 2016-06-07 (×2): 20 mg via ORAL
  Filled 2016-06-06 (×2): qty 1

## 2016-06-06 MED ORDER — BISACODYL 10 MG RE SUPP
10.0000 mg | Freq: Once | RECTAL | Status: AC
  Administered 2016-06-06: 10 mg via RECTAL
  Filled 2016-06-06: qty 1

## 2016-06-06 NOTE — Lactation Note (Addendum)
This note was copied from a baby's chart. Lactation Consultation Note  Patient Name: [name] [id number] Date: 06/06/2016 Reason for consult: Follow-up assessment  With this mom and term baby, now 72 hours old. Mom reports breast feeding going well, and baby had just fallen asleep. I asked mom if she wanted me to look up any medications she might be going back on. She said she wanted to restart her wellbutrin. It is an L 3 in [employer], limited research, but probably compatible. In the studies, despite a  mom's dose, there was none detectible in the babies. I relayed this information to mom. She said it was difficult for her, during the pregnancy, to be off her wellbutrin. I encouraged her to talk to her MD and consider going back on. Mom has soft breasts with easily expressed colostrum. The baby has had more than adequate wet and dirty diapers since birth. Mom knows to call for questions/conerns.  Maternal Data    Feeding Length of feed: 10 min  LATCH Score/Interventions    Audible Swallowing:  (easily hand expressed colsotrum)  Type of Nipple:  (semi flat, short shaft)  Comfort (Breast/Nipple): Soft / non-tender           Lactation Tools Discussed/Used     Consult Status Consult Status: Follow-up Date: 06/07/16 Follow-up type: In-patient    Tonna Corner 06/06/2016, 2:43 PM

## 2016-06-06 NOTE — Progress Notes (Signed)
Subjective: Postpartum Day 2: Cesarean Delivery Patient reports tolerating PO and no problems voiding.   No flatus or BM Tolerating regular diet, no N/V Ambulating  Objective: Vital signs in last 24 hours: Temp:  [97.8 F (36.6 C)-98.4 F (36.9 C)] 98 F (36.7 C) (01/20 0606) Pulse Rate:  [75-90] 83 (01/20 0606) Resp:  [18] 18 (01/20 0606) BP: (94-123)/(46-73) 120/73 (01/20 0606) SpO2:  [96 %-98 %] 98 % (01/19 1626)  Physical Exam:  General: alert, cooperative and no distress Lochia: appropriate Uterine Fundus: firm Incision: healing well DVT Evaluation: No evidence of DVT seen on physical exam. Abdomen soft but about same distention, BS +  Recent Labs  06/05/16 0528  HGB 10.2*  HCT 29.6*    Assessment/Plan: Status post Cesarean section. Postoperative course complicated by delayed return of bowel function  Continue current care CBC Dulcolax suppository Ambulation .  [name],[name] 06/06/2016, 8:32 AM

## 2016-06-07 ENCOUNTER — Inpatient Hospital Stay (HOSPITAL_COMMUNITY): Payer: Managed Care, Other (non HMO)

## 2016-06-07 LAB — CBC
HCT: 31.8 % — ABNORMAL LOW (ref 36.0–46.0)
Hemoglobin: 11 g/dL — ABNORMAL LOW (ref 12.0–15.0)
MCH: 30.4 pg (ref 26.0–34.0)
MCHC: 34.6 g/dL (ref 30.0–36.0)
MCV: 87.8 fL (ref 78.0–100.0)
Platelets: 316 10*3/uL (ref 150–400)
RBC: 3.62 MIL/uL — ABNORMAL LOW (ref 3.87–5.11)
RDW: 15.2 % (ref 11.5–15.5)
WBC: 12.8 10*3/uL — ABNORMAL HIGH (ref 4.0–10.5)

## 2016-06-07 LAB — COMPREHENSIVE METABOLIC PANEL
ALT: 16 U/L (ref 14–54)
AST: 20 U/L (ref 15–41)
Albumin: 2.3 g/dL — ABNORMAL LOW (ref 3.5–5.0)
Alkaline Phosphatase: 97 U/L (ref 38–126)
Anion gap: 8 (ref 5–15)
BUN: 9 mg/dL (ref 6–20)
CO2: 24 mmol/L (ref 22–32)
Calcium: 7.7 mg/dL — ABNORMAL LOW (ref 8.9–10.3)
Chloride: 105 mmol/L (ref 101–111)
Creatinine, Ser: 0.67 mg/dL (ref 0.44–1.00)
GFR calc Af Amer: >60 mL/min (ref >60)
GFR calc non Af Amer: >60 mL/min (ref >60)
Glucose, Bld: 92 mg/dL (ref 65–99)
Potassium: 3.7 mmol/L (ref 3.5–5.1)
Sodium: 137 mmol/L (ref 135–145)
Total Bilirubin: 0.9 mg/dL (ref 0.3–1.2)
Total Protein: 5.2 g/dL — ABNORMAL LOW (ref 6.5–8.1)

## 2016-06-07 MED ORDER — TRAMADOL HCL 50 MG PO TABS: 50.0000 mg | ORAL_TABLET | Freq: Four times a day (QID) | ORAL | 0 refills | Status: DC | PRN

## 2016-06-07 MED ORDER — BISACODYL 10 MG RE SUPP
10.0000 mg | Freq: Once | RECTAL | Status: AC
  Administered 2016-06-07: 10 mg via RECTAL
  Filled 2016-06-07: qty 1

## 2016-06-07 MED ORDER — LACTATED RINGERS IV SOLN
INTRAVENOUS | Status: DC
  Administered 2016-06-07 – 2016-06-08 (×3): via INTRAVENOUS

## 2016-06-07 MED ORDER — HYDROMORPHONE HCL 1 MG/ML IJ SOLN
1.0000 mg | INTRAMUSCULAR | Status: DC | PRN
  Administered 2016-06-07 – 2016-06-08 (×5): 1 mg via INTRAVENOUS
  Filled 2016-06-07 (×5): qty 1

## 2016-06-07 MED ORDER — LACTATED RINGERS IV BOLUS (SEPSIS)
250.0000 mL | Freq: Once | INTRAVENOUS | Status: AC
  Administered 2016-06-07: 15:00:00 via INTRAVENOUS

## 2016-06-07 MED ORDER — ACETAMINOPHEN 500 MG PO TABS: 1000.0000 mg | ORAL_TABLET | Freq: Four times a day (QID) | ORAL | 0 refills | Status: DC | PRN

## 2016-06-07 NOTE — Progress Notes (Signed)
I gave MOB a mixer of prune juice and apple juice. Two hours later MOB informed me she was able to use the bathroom and that she feels much better now that she has.

## 2016-06-07 NOTE — Discharge Summary (Signed)
Obstetric Discharge Summary Reason for Admission: onset of labor Prenatal Procedures: none Intrapartum Procedures: cesarean: low cervical, transverse Postpartum Procedures: none Complications-Operative and Postpartum: none Hemoglobin  Date Value Ref Range Status  06/06/2016 10.3 (L) 12.0 - 15.0 g/dL Final   HCT  Date Value Ref Range Status  06/06/2016 30.4 (L) 36.0 - 46.0 % Final    Physical Exam:  General: alert, cooperative and no distress Lochia: appropriate Uterine Fundus: firm Incision: healing well DVT Evaluation: No evidence of DVT seen on physical exam.  Discharge Diagnoses: Term Pregnancy-delivered  Discharge Information: Date: 06/07/2016 Activity: pelvic rest Diet: routine Medications: PNV, Ibuprofen and Percocet Condition: stable Instructions: refer to practice specific booklet Discharge to: home   Newborn Data: Live born female  Birth Weight: 8 lb 3.2 oz (3720 g) APGAR: 9, 9  Home with mother.  Shawntae Lowy II,Krystin Keeven E 06/07/2016, 9:01 AM

## 2016-06-07 NOTE — Lactation Note (Signed)
This note was copied from a baby's chart. Lactation Consultation Note  Patient Name: Girl Anacaren Lewi [id number] Date: 06/07/2016 Reason for consult: Follow-up assessment   With this mom and term baby, now 60 hours old. Mom had just finished breast feeding the baby when I walked in the room. The baby had been in the CNS for a few hours, to allow mom to rest. Mom has had problems with an ileus, but has been discharged to home. Mom was saying she was still having abdominal pain, and may switch to formula feed. She seems overwhelmed. I told mom to do what is best for her, that pumping and bottle was an option, but pumping can increase uterine cramping. Mom does have a DEP at home. I told mom what to do if she decides to stop breastfeeding(comfortable bra, cabbage), and to call for lactation as needed.    Maternal Data    Feeding Feeding Type: Breast Fed Length of feed:  (baby had just finished when I walked in the room)  LATCH Score/Interventions Latch: Grasps breast easily, tongue down, lips flanged, rhythmical sucking. Intervention(s): Skin to skin  Audible Swallowing: A few with stimulation (easily expressed transitional milk) Intervention(s): Skin to skin;Hand expression  Type of Nipple: Everted at rest and after stimulation  Comfort (Breast/Nipple): Soft / non-tender     Hold (Positioning): No assistance needed to correctly position infant at breast.  LATCH Score: 9  Lactation Tools Discussed/Used     Consult Status Consult Status: Complete Follow-up type: Call as needed    Tonna Corner 06/07/2016, 10:14 AM

## 2016-06-07 NOTE — Progress Notes (Signed)
CTSP: Had recurrent N/V. No further flatus. States her abdomen feels more distended than this am.   Vitals:   06/06/16 1804 06/07/16 0537  BP: 136/84 117/70  Pulse: 89 80  Resp: 20 20  Temp: 97.8 F (36.6 C) 98 F (36.7 C)   Patient in NAD Abdomen-moderate distention-more than this am                  Few BS  AAS done-report pending  A/P: Probable PO ileus         NPO x ice chips         IV fluids         CBC, CMET         Repeat xray in am         D/W patient and mother-she states she understands and agrees

## 2016-06-07 NOTE — Discharge Instructions (Signed)
No vaginal entry °No heavy lifting °No operation of automobiles °

## 2016-06-07 NOTE — Progress Notes (Signed)
Subjective: Postpartum Day 3: Cesarean Delivery Patient reports tolerating PO, + flatus, + BM and no problems voiding.   Had BM X 3 after dulcolax suppository and prune juice No N/V Objective: Vital signs in last 24 hours: Temp:  [97.8 F (36.6 C)-98 F (36.7 C)] 98 F (36.7 C) (01/21 0537) Pulse Rate:  [80-89] 80 (01/21 0537) Resp:  [20] 20 (01/21 0537) BP: (117-136)/(70-84) 117/70 (01/21 0537) SpO2:  [100 %] 100 % (01/20 1804)  Physical Exam:  General: alert, cooperative and no distress Lochia: appropriate Uterine Fundus: firm Incision: healing well DVT Evaluation: No evidence of DVT seen on physical exam. Abdomen soft, BS+  Recent Labs  06/05/16 0528 06/06/16 0857  HGB 10.2* 10.3*  HCT 29.6* 30.4*    Assessment/Plan: Status post Cesarean section. Doing well postoperatively.  Continue current care.  July Linam II,Jowel Waltner E 06/07/2016, 8:58 AM

## 2016-06-08 ENCOUNTER — Inpatient Hospital Stay (HOSPITAL_COMMUNITY): Payer: Managed Care, Other (non HMO)

## 2016-06-08 LAB — CBC
HCT: 31.8 % — ABNORMAL LOW (ref 36.0–46.0)
Hemoglobin: 10.8 g/dL — ABNORMAL LOW (ref 12.0–15.0)
MCH: 30.5 pg (ref 26.0–34.0)
MCHC: 34 g/dL (ref 30.0–36.0)
MCV: 89.8 fL (ref 78.0–100.0)
Platelets: 336 10*3/uL (ref 150–400)
RBC: 3.54 MIL/uL — ABNORMAL LOW (ref 3.87–5.11)
RDW: 15.4 % (ref 11.5–15.5)
WBC: 10.8 10*3/uL — ABNORMAL HIGH (ref 4.0–10.5)

## 2016-06-08 LAB — COMPREHENSIVE METABOLIC PANEL
ALT: 18 U/L (ref 14–54)
AST: 24 U/L (ref 15–41)
Albumin: 2.4 g/dL — ABNORMAL LOW (ref 3.5–5.0)
Alkaline Phosphatase: 95 U/L (ref 38–126)
Anion gap: 7 (ref 5–15)
BUN: 10 mg/dL (ref 6–20)
CO2: 26 mmol/L (ref 22–32)
Calcium: 7.8 mg/dL — ABNORMAL LOW (ref 8.9–10.3)
Chloride: 105 mmol/L (ref 101–111)
Creatinine, Ser: 0.66 mg/dL (ref 0.44–1.00)
GFR calc Af Amer: >60 mL/min (ref >60)
GFR calc non Af Amer: >60 mL/min (ref >60)
Glucose, Bld: 76 mg/dL (ref 65–99)
Potassium: 3.9 mmol/L (ref 3.5–5.1)
Sodium: 138 mmol/L (ref 135–145)
Total Bilirubin: 0.9 mg/dL (ref 0.3–1.2)
Total Protein: 5.2 g/dL — ABNORMAL LOW (ref 6.5–8.1)

## 2016-06-08 MED ORDER — FAMOTIDINE IN NACL 20-0.9 MG/50ML-% IV SOLN
20.0000 mg | Freq: Two times a day (BID) | INTRAVENOUS | Status: DC
  Administered 2016-06-08 – 2016-06-09 (×3): 20 mg via INTRAVENOUS
  Filled 2016-06-08 (×5): qty 50

## 2016-06-08 MED ORDER — KETOROLAC TROMETHAMINE 30 MG/ML IJ SOLN
30.0000 mg | Freq: Four times a day (QID) | INTRAMUSCULAR | Status: DC | PRN
Start: 2016-06-08 — End: 2016-06-09
  Administered 2016-06-08 – 2016-06-09 (×2): 30 mg via INTRAVENOUS
  Filled 2016-06-08 (×2): qty 1

## 2016-06-08 MED ORDER — DEXTROSE IN LACTATED RINGERS 5 % IV SOLN
INTRAVENOUS | Status: DC
  Administered 2016-06-08 (×2): via INTRAVENOUS

## 2016-06-08 MED ORDER — METOCLOPRAMIDE HCL 5 MG/ML IJ SOLN
5.0000 mg | Freq: Once | INTRAMUSCULAR | Status: AC
  Administered 2016-06-08: 5 mg via INTRAVENOUS
  Filled 2016-06-08: qty 2

## 2016-06-08 MED ORDER — METOCLOPRAMIDE HCL 5 MG/ML IJ SOLN: 5.0000 mg | Freq: Once | INTRAMUSCULAR | Status: DC

## 2016-06-08 NOTE — Lactation Note (Signed)
This note was copied from a baby's chart. Lactation Consultation Note  Patient Name: [name] [id number] Date: 06/08/2016 Reason for consult: Follow-up assessment   Follow up with mom to offer BF Support. Mom declined need for Lactation assistance at this time. Mom denies s/s engorgement. Enc mom to call for feeding assistance if needed.    Maternal Data    Feeding Feeding Type: Formula Nipple Type: Slow - flow  LATCH Score/Interventions                      Lactation Tools Discussed/Used     Consult Status Consult Status: PRN Follow-up type: Call as needed    Donn Pierini 06/08/2016, 3:57 PM

## 2016-06-08 NOTE — Progress Notes (Signed)
Pt reports she has passed gas

## 2016-06-08 NOTE — Plan of Care (Signed)
Problem: Activity: Goal: Ability to tolerate increased activity will improve Outcome: Progressing Pt able to ambulate independently in room.  Encouraged to ambulate frequently in the hallways.  Problem: Bowel/Gastric: Goal: Gastrointestinal status will improve Outcome: Not Progressing Pt reports feeling bowel is more distended.  Abdomen soft, high pitched bowel sound upper abdomen and normal active bowel sounds lower abdomen.  Pt reports small bowel movement 06/07/16 after suppository.  Pt denies passing gas.

## 2016-06-08 NOTE — Progress Notes (Addendum)
Pt continues to feel abdominal pain but controlled with IV toradol.  Has not requested dilaudid since this am.  No flatus yet  AF, VSS Abd - soft, distended.  + BS in all 4 quads - improved from this morning Ext - NT  AXR d/w radiologist.  Gas seen in distal rectum, suspected ileus.   CBC & CMET reviewed  A/P:  Postop Ileus Continue IVF and IV pain meds prn

## 2016-06-08 NOTE — Progress Notes (Signed)
Pt continues to have pain requiring dilaudid Q4hours.  No futher N/V.  No flatus or BM.  Pt feels more distended than yesterday.  AF, VSS Gen - pt appears uncomfortable Abd - soft but distended.  Few high pitched bowel sounds Ext - NT, no edema  Labs pending AXR 1/21:  Dilated colon (up to 10cm).  No free air.  Suspect ileus but cant r/o distal obstruction  A/P:  POD#4 s/p primary c-section now with suspected ileus Continue IVF and pain control Repeat CBC/CMET pending Repeat AXR pending

## 2016-06-08 NOTE — Progress Notes (Signed)
Pt passing gas and + small BM.  Feeling better.  Will advance to liquid diet  ga

## 2016-06-09 MED ORDER — IBUPROFEN 600 MG PO TABS: 600.0000 mg | ORAL_TABLET | Freq: Four times a day (QID) | ORAL | Status: DC | PRN

## 2016-06-09 MED ORDER — IBUPROFEN 600 MG PO TABS: 600.0000 mg | ORAL_TABLET | Freq: Four times a day (QID) | ORAL | 0 refills | Status: DC | PRN

## 2016-06-09 NOTE — Plan of Care (Signed)
Problem: Activity: Goal: Ability to tolerate increased activity will improve Outcome: Completed/Met Date Met: 06/09/16 Pt ambulating frequently in the hallways.  Problem: Bowel/Gastric: Goal: Gastrointestinal status will improve Outcome: Progressing Pt passing gas, had multiple bowel movements since 06/08/15, abdomen soft and less distended, and pt reports significantly decreased pain.  Will advance diet to regular.

## 2016-06-09 NOTE — Plan of Care (Signed)
Problem: Bowel/Gastric: Goal: Gastrointestinal status will improve Outcome: Completed/Met Date Met: 06/09/16 Pt tolerating regular diet without any nausea or vomiting.

## 2016-06-09 NOTE — Progress Notes (Signed)
At approximately 0045 contacted Dr Julien Girt to inquire about giving PO meds, Dr Julien Girt permitted giving PO meds    At approximately 0340 contacted Dr Julien Girt to either renew fluid orders or saline lock Iv, Dr Julien Girt permitted saline locking IV

## 2016-06-09 NOTE — Progress Notes (Signed)
Subjective: Postpartum Day 5: Cesarean Delivery Patient reports + flatus and + BM.  Hungry now, No N+V  Objective: Vital signs in last 24 hours: Temp:  [98.2 F (36.8 C)-98.8 F (37.1 C)] 98.8 F (37.1 C) (01/23 0500) Pulse Rate:  [69-74] 74 (01/23 0500) Resp:  [18] 18 (01/23 0500) BP: (115-126)/(63-80) 126/80 (01/23 0500)  Physical Exam:  General: alert Lochia: appropriate Uterine Fundus: firm Incision: healing well DVT Evaluation: No evidence of DVT seen on physical exam.  ABD soft + BS, Inc C/D   Recent Labs  06/07/16 1503 06/08/16 0840  HGB 11.0* 10.8*  HCT 31.8* 31.8*    Assessment/Plan: Status post Cesarean section. Doing well postoperatively.  Plan D/C this PM if tol Reg diet this am.  [name] 06/09/2016, 8:29 AM

## 2016-06-09 NOTE — Discharge Summary (Signed)
Physician Discharge Summary  Patient ID: [name] MRN: [id number] DOB/AGE: 36/25/1982 36 y.o.  Admit date: 06/03/2016 Discharge date: 06/09/2016  Admission Diagnoses:IOL @  Term,arrest of dilitation, Primary CS  Discharge Diagnoses: same + post op ILEUS Active Problems:   Normal pregnancy   Discharged Condition: good  Hospital Course: amd for IOL at term, underwent primary CS for CPD, was doing well and ready for D/C POD 3, but developed incr N+V, exam and abd xray c/w ileus  Ileus gradually resolved over next 2 days and on POD 5, had + flatus, + BM, and tol Reg diet  Consults: None  Significant Diagnostic Studies: labs:  Results for orders placed or performed during the hospital encounter of 06/03/16 (from the past 24 hour(s))  CBC     Status: Abnormal   Collection Time: 06/08/16  8:40 AM  Result Value Ref Range   WBC 10.8 (H) 4.0 - 10.5 K/uL   RBC 3.54 (L) 3.87 - 5.11 MIL/uL   Hemoglobin 10.8 (L) 12.0 - 15.0 g/dL   HCT 31.8 (L) 36.0 - 46.0 %   MCV 89.8 78.0 - 100.0 fL   MCH 30.5 26.0 - 34.0 pg   MCHC 34.0 30.0 - 36.0 g/dL   RDW 15.4 11.5 - 15.5 %   Platelets 336 150 - 400 K/uL  Comprehensive metabolic panel     Status: Abnormal   Collection Time: 06/08/16  8:40 AM  Result Value Ref Range   Sodium 138 135 - 145 mmol/L   Potassium 3.9 3.5 - 5.1 mmol/L   Chloride 105 101 - 111 mmol/L   CO2 26 22 - 32 mmol/L   Glucose, Bld 76 65 - 99 mg/dL   BUN 10 6 - 20 mg/dL   Creatinine, Ser 0.66 0.44 - 1.00 mg/dL   Calcium 7.8 (L) 8.9 - 10.3 mg/dL   Total Protein 5.2 (L) 6.5 - 8.1 g/dL   Albumin 2.4 (L) 3.5 - 5.0 g/dL   AST 24 15 - 41 U/L   ALT 18 14 - 54 U/L   Alkaline Phosphatase 95 38 - 126 U/L   Total Bilirubin 0.9 0.3 - 1.2 mg/dL   GFR calc non Af Amer >60 >60 mL/min   GFR calc Af Amer >60 >60 mL/min   Anion gap 7 5 - 15    Treatments: IV hydration and surgery: Primary CS  Discharge Exam: Blood pressure 126/80, pulse 74, temperature 98.8 F (37.1 C),  temperature source Oral, resp. rate 18, height 5\' 4"  (1.626 m), weight 95.3 kg (210 lb), last menstrual period 08/29/2015, SpO2 99 %, unknown if currently breastfeeding. abd soft + BS, Inc C/D  Disposition:   Discharge Instructions    Discharge patient    Complete by:  As directed    Discharge disposition:  01-Home or Self Care   Discharge patient date:  06/07/2016     Allergies as of 06/09/2016      Reactions   Codeine Swelling      Medication List    TAKE these medications   acetaminophen 500 MG tablet Commonly known as:  TYLENOL Take 2 tablets (1,000 mg total) by mouth every 6 (six) hours as needed for mild pain.   flintstones complete 60 MG chewable tablet Chew 1 tablet by mouth daily.   ibuprofen 600 MG tablet Commonly known as:  ADVIL,MOTRIN Take 1 tablet (600 mg total) by mouth every 6 (six) hours as needed for moderate pain.   traMADol 50 MG tablet Commonly known as:  ULTRAM Take 1 tablet (50 mg total) by mouth every 6 (six) hours as needed for moderate pain.      Follow-up Information    [facility]. Schedule an appointment as soon as possible for a visit in 10 day(s).   Contact information: [address] [place] [address] [phone]           Signed: Margarette Asal 06/09/2016, 8:34 AM

## 2016-06-23 ENCOUNTER — Encounter: Payer: Self-pay | Admitting: Obstetrics and Gynecology

## 2016-07-30 ENCOUNTER — Ambulatory Visit: Payer: Managed Care, Other (non HMO) | Admitting: Osteopathic Medicine

## 2016-08-03 ENCOUNTER — Telehealth: Payer: Self-pay | Admitting: Osteopathic Medicine

## 2016-08-03 NOTE — Telephone Encounter (Signed)
Pt would like Mirena.

## 2016-08-03 NOTE — Telephone Encounter (Signed)
Does this patient already know which IUD she'd like to have, or is this a consult for more information/discussion?

## 2016-08-04 ENCOUNTER — Encounter: Payer: Self-pay | Admitting: Osteopathic Medicine

## 2016-08-04 ENCOUNTER — Ambulatory Visit (INDEPENDENT_AMBULATORY_CARE_PROVIDER_SITE_OTHER): Payer: Managed Care, Other (non HMO) | Admitting: Osteopathic Medicine

## 2016-08-04 VITALS — BP 117/65 | HR 74 | Ht 64.0 in | Wt 185.0 lb

## 2016-08-04 DIAGNOSIS — Z30014 Encounter for initial prescription of intrauterine contraceptive device: Secondary | ICD-10-CM | POA: Diagnosis not present

## 2016-08-04 DIAGNOSIS — Z30430 Encounter for insertion of intrauterine contraceptive device: Secondary | ICD-10-CM | POA: Diagnosis not present

## 2016-08-04 LAB — POCT URINE PREGNANCY: Preg Test, Ur: NEGATIVE

## 2016-08-04 NOTE — Patient Instructions (Signed)
IUD AFTER-CARE INSTRUCTIONS: READ THOROUGHLY  Your Mirena IUD is currently approved to remain in place for 5 years. At that time, if you wish to receive a new IUD, this can be placed when your current one is removed. If you wish to remove your IUD at any time, for any reason, this can be done easily by your doctor.   You should feel for the strings to your IUD routinely. If you cannot locate the strings, it is recommended you alert your doctor of this.   Be aware that in the first few weeks, your new IUD may cause some discomfort/cramping as it settles into place in your uterus. To ease discomfort, you may apply heating pad to abdomen and take Ibuprofen 800mg by mouth every 6 hours as needed, but avoid using this dose continuously for more than 5 days. Walking helps as well. You can also expect some irregular bleeding but it should not be heavy for more than a few days.   If pain is severe or if severe bleeding occurs, or if foul-smelling discharge or fever develops, or if you have any other concerns - contact your doctor right away or go to the Emergency Room.  IUD's are a very reliable method of birth control, but no method is 100% effective. If you think you may be pregnant, see your doctor right away. An IUD will not protect you from sexually transmitted infections such as HIV, gonorrhea, chlamydia, HPV and others.   It is recommended that you see your doctor as directed for routine well-woman care, which includes Pap testing and may include screening for infections.   

## 2016-08-04 NOTE — Progress Notes (Signed)
HPI: [name] is a 36 y.o. female who presents to [facility] today 08/05/16 for chief complaint of:  Chief Complaint  Patient presents with  . Contraception   Patient is seen at the request of [place] [facility] for consultation regarding IUD birth control. All questions were answered prior to initiation of procedure.   Past medical, social and family history reviewed:  No current outpatient prescriptions on file. Allergies  Allergen Reactions  . Codeine Swelling      Review of Systems: CONSTITUTIONAL:  No  fever, no chills GASTROINTESTINAL: No  nausea, No  vomiting, No  abdominal pain GENITOURINARY: No  incontinence, No  abnormal genital bleeding/discharge SKIN: No  rash/wounds/concerning lesions   Exam:  BP 117/65   Pulse 74   Ht 5\' 4"  (1.626 m)   Wt 185 lb (83.9 kg)   BMI 31.76 kg/m  Constitutional: VS see above. General Appearance: alert, well-developed, well-nourished, NAD Psychiatric: Normal judgment/insight. Normal mood and affect. Oriented x3.  GYN: see procedure note below    ASSESSMENT/PLAN: Patient was counseled on the risks versus benefits of IUD contraception, including excellent contraceptive efficacy but risk of uterine perforation, small chance of ascending infection, irregular bleeding, and others. In light of her preferences, previous contraception experience, other risk factors as noted in history of present illness. Patient opts to proceed with insertion of Mirena IUD. Patient was educated on the process for insertion, reasons that we would abort the procedure such as abnormal anatomy, non-passage of the uterine sound, patient inability to tolerate the procedure, or other. Pt tolerated insertion well and followup instructions as noted below.   Encounter for initial prescription of intrauterine contraceptive device (IUD) - Plan: POCT urine pregnancy  IUD PROCEDURE NOTE  PERTINENT RESULTS REVIEWED: PREGNANCY TEST PRIOR  TO PROCEDURE: Negative GONORRHEA/CHLAMYDIA SCREEN: N/A  PRIOR TO PROCEDURE: INFORMED CONSENT OBTAINED: yes SEE SCANNED DOCUMENTS ANY PRETREATMENT: Ibuprofen   PHYSICAL EXAM: GYN: No lesions/ulcers to external genitalia, normal urethra, normal vaginal mucosa, physiologic discharge, cervix normal without lesions, uterus not enlarged or tender, adnexa no masses and nontender  DESCRIPTION OF PROCEDURE: Vaginal speculum placed. Cervix and proximal vagina cleaned with Betadine.Tenaculum applied at 12:00 cervical position and gentle traction applied. Uterus sounded to 9 cm. IUD placed without difficulty. IUD threads cut to 2-3cm from cervical os. Tenaculum and speculum removed. Patient felt strings. Patient tolerated procedure well. Sterile technique maintained.   IUD INFORMATION: BRAND: Mirena  LOT NUMBER: [id number] CARD GIVEN TO PATIENT: yes    Patient Instructions  IUD AFTER-CARE INSTRUCTIONS: READ THOROUGHLY  Your Mirena IUD is currently approved to remain in place for 5 years. At that time, if you wish to receive a new IUD, this can be placed when your current one is removed. If you wish to remove your IUD at any time, for any reason, this can be done easily by your doctor.   You should feel for the strings to your IUD routinely. If you cannot locate the strings, it is recommended you alert your doctor of this.   Be aware that in the first few weeks, your new IUD may cause some discomfort/cramping as it settles into place in your uterus. To ease discomfort, you may apply heating pad to abdomen and take Ibuprofen 800mg  by mouth every 6 hours as needed, but avoid using this dose continuously for more than 5 days. Walking helps as well. You can also expect some irregular bleeding but it should not be heavy for more than  a few days.   If pain is severe or if severe bleeding occurs, or if foul-smelling discharge or fever develops, or if you have any other concerns - contact your doctor right away  or go to the Emergency Room.  IUD's are a very reliable method of birth control, but no method is 100% effective. If you think you may be pregnant, see your doctor right away.   An IUD will not protect you from sexually transmitted infections such as HIV, gonorrhea, chlamydia, HPV and others.   It is recommended that you see your doctor as directed for routine well-woman care, which includes Pap testing and may include screening for infections.    All questions were answered. Visit summary with updated medication list and pertinent instructions was printed for patient. ER/RTC precautions were reviewed with the patient. Return for IUD insertion / string check 4 weeks as directed.   Total time spent 30 minutes, greater than 50% of the visit was counseling and coordinating care for diagnosis of The encounter diagnosis was Encounter for initial prescription of intrauterine contraceptive device (IUD).

## 2016-08-05 DIAGNOSIS — Z30430 Encounter for insertion of intrauterine contraceptive device: Secondary | ICD-10-CM | POA: Insufficient documentation

## 2016-08-12 ENCOUNTER — Ambulatory Visit (INDEPENDENT_AMBULATORY_CARE_PROVIDER_SITE_OTHER): Payer: 59 | Admitting: Physician Assistant

## 2016-08-12 ENCOUNTER — Encounter: Payer: Self-pay | Admitting: Physician Assistant

## 2016-08-12 VITALS — BP 112/75 | HR 83 | Temp 98.0°F | Ht 64.0 in | Wt 186.0 lb

## 2016-08-12 DIAGNOSIS — R062 Wheezing: Secondary | ICD-10-CM | POA: Diagnosis not present

## 2016-08-12 DIAGNOSIS — J208 Acute bronchitis due to other specified organisms: Secondary | ICD-10-CM

## 2016-08-12 DIAGNOSIS — R05 Cough: Secondary | ICD-10-CM

## 2016-08-12 DIAGNOSIS — R059 Cough, unspecified: Secondary | ICD-10-CM

## 2016-08-12 MED ORDER — ALBUTEROL SULFATE HFA 108 (90 BASE) MCG/ACT IN AERS: 2.0000 | INHALATION_SPRAY | Freq: Four times a day (QID) | RESPIRATORY_TRACT | 1 refills | Status: DC | PRN

## 2016-08-12 MED ORDER — PREDNISONE 50 MG PO TABS: ORAL_TABLET | ORAL | 0 refills | Status: DC

## 2016-08-12 MED ORDER — AZITHROMYCIN 250 MG PO TABS: ORAL_TABLET | ORAL | 0 refills | Status: DC

## 2016-08-12 MED ORDER — IPRATROPIUM-ALBUTEROL 0.5-2.5 (3) MG/3ML IN SOLN
3.0000 mL | Freq: Once | RESPIRATORY_TRACT | Status: AC
Start: 2016-08-12 — End: 2016-08-12
  Administered 2016-08-12: 3 mL via RESPIRATORY_TRACT

## 2016-08-12 NOTE — Patient Instructions (Signed)

## 2016-08-12 NOTE — Progress Notes (Signed)
   Subjective:    Patient ID: [name], female    DOB: 06/09/1980, 36 y.o.   MRN: [id number]  HPI  Pt is a 36 yo female who presents to the clinic with 6 days of URI symptoms with cough being the most bothersome. She denies any fever, chills, sinus pressure, facial pain. Cough is mildly productive. Tried alka seltzer cold and cough with some relief. No hx of asthma but has use albuterol inhaler in the past.    Review of Systems See HPI.     Objective:   Physical Exam  Constitutional: She is oriented to person, place, and time. She appears well-developed and well-nourished.  HENT:  Head: Normocephalic and atraumatic.  Right Ear: External ear normal.  Left Ear: External ear normal.  Nose: Nose normal.  Mouth/Throat: Oropharynx is clear and moist. No oropharyngeal exudate.  TM"s clear.  No tenderness over sinuses.  Oropharynx erythematous Red and swollen nasal turbinates.   Eyes: Conjunctivae are normal. Right eye exhibits no discharge. Left eye exhibits no discharge.  Neck: Normal range of motion. Neck supple.  Cardiovascular: Normal rate, regular rhythm and normal heart sounds.   Pulmonary/Chest:  Coughing continuously.  Wheezing bilaterally.  rhonchi bilaterally.   Lymphadenopathy:    She has no cervical adenopathy.  Neurological: She is alert and oriented to person, place, and time.  Skin:  Flushed cheeks.   Psychiatric: She has a normal mood and affect. Her behavior is normal.          Assessment & Plan:  [name] KitchenMarland KitchenMonee was seen today for cough, headache and sore throat.  Diagnoses and all orders for this visit:  Acute bronchitis due to other specified organisms -     azithromycin (ZITHROMAX) 250 MG tablet; 2 tablets now and then one tablet for 4 days. -     predniSONE (DELTASONE) 50 MG tablet; Take one tablet for 5 days. -     albuterol (PROVENTIL HFA;VENTOLIN HFA) 108 (90 Base) MCG/ACT inhaler; Inhale 2 puffs into the lungs every 6 (six) hours as needed for wheezing  or shortness of breath.  Cough -     azithromycin (ZITHROMAX) 250 MG tablet; 2 tablets now and then one tablet for 4 days. -     predniSONE (DELTASONE) 50 MG tablet; Take one tablet for 5 days. -     albuterol (PROVENTIL HFA;VENTOLIN HFA) 108 (90 Base) MCG/ACT inhaler; Inhale 2 puffs into the lungs every 6 (six) hours as needed for wheezing or shortness of breath. -     ipratropium-albuterol (DUONEB) 0.5-2.5 (3) MG/3ML nebulizer solution 3 mL; Take 3 mLs by nebulization once.  Wheezing -     predniSONE (DELTASONE) 50 MG tablet; Take one tablet for 5 days. -     albuterol (PROVENTIL HFA;VENTOLIN HFA) 108 (90 Base) MCG/ACT inhaler; Inhale 2 puffs into the lungs every 6 (six) hours as needed for wheezing or shortness of breath. -     ipratropium-albuterol (DUONEB) 0.5-2.5 (3) MG/3ML nebulizer solution 3 mL; Take 3 mLs by nebulization once.   duoneb with minimal benefit. Wheezing did improve on exam.  Symptomatic care discussed.  delxym for cough.  Hold zpak for 2 days to see if prednisone alone improves symptoms.  Follow up as needed.

## 2016-09-01 ENCOUNTER — Encounter: Payer: Self-pay | Admitting: Osteopathic Medicine

## 2016-09-01 ENCOUNTER — Ambulatory Visit (INDEPENDENT_AMBULATORY_CARE_PROVIDER_SITE_OTHER): Payer: 59 | Admitting: Osteopathic Medicine

## 2016-09-01 VITALS — BP 115/76 | HR 85 | Wt 188.0 lb

## 2016-09-01 DIAGNOSIS — Z30431 Encounter for routine checking of intrauterine contraceptive device: Secondary | ICD-10-CM | POA: Diagnosis not present

## 2016-09-01 MED ORDER — LEVONORGESTREL 20 MCG/24HR IU IUD: INTRAUTERINE_SYSTEM | INTRAUTERINE | 0 refills | Status: DC

## 2016-09-01 NOTE — Progress Notes (Signed)
HPI: [name] is a 36 y.o. female  who presents to [facility] today, 09/01/16,  for chief complaint of:  Chief Complaint  Patient presents with  . Follow-up    IUD STRING CHECK    IUD placed 1 month ago, here for string check. Doing well, bleeding has resolved, no pain/cramping, no abnormal discharge.   Past medical, surgical, social and family history reviewed: Patient Active Problem List   Diagnosis Date Noted  . Encounter for insertion of intrauterine contraceptive device (IUD) 08/05/2016  . Normal pregnancy 06/03/2016  . [redacted] weeks gestation of pregnancy 03/20/2016  . Iron deficiency anemia 03/20/2016  . Borderline personality disorder 07/09/2015  . Generalized anxiety disorder 07/09/2015  . Depression 07/09/2015  . Bipolar 1 disorder, mixed, moderate ([place]) 07/09/2015  . Left breast lump 03/05/2015  . Nipple discharge 03/05/2015   Past Surgical History:  Procedure Laterality Date  . CESAREAN SECTION N/A 06/04/2016   Procedure: CESAREAN SECTION;  Surgeon: Tyson Dense, MD;  Location: [facility];  Service: Obstetrics;  Laterality: N/A;  . MOUTH SURGERY     Social History  Substance Use Topics  . Smoking status: Former Smoker    Packs/day: 0.50    Types: Cigarettes    Quit date: 09/16/2015  . Smokeless tobacco: Never Used  . Alcohol use 1.2 oz/week    2 Standard drinks or equivalent per week   Family History  Problem Relation Age of Onset  . Stroke Father   . Cancer Maternal Aunt     stomach  . Cancer Maternal Grandmother     ovarian     Current medication list and allergy/intolerance information reviewed:   Current Outpatient Prescriptions  Medication Sig Dispense Refill  . albuterol (PROVENTIL HFA;VENTOLIN HFA) 108 (90 Base) MCG/ACT inhaler Inhale 2 puffs into the lungs every 6 (six) hours as needed for wheezing or shortness of breath. (Patient not taking: Reported on 09/01/2016) 1 Inhaler 1  . azithromycin  (ZITHROMAX) 250 MG tablet 2 tablets now and then one tablet for 4 days. (Patient not taking: Reported on 09/01/2016) 6 tablet 0  . predniSONE (DELTASONE) 50 MG tablet Take one tablet for 5 days. (Patient not taking: Reported on 09/01/2016) 5 tablet 0   No current facility-administered medications for this visit.    Allergies  Allergen Reactions  . Codeine Swelling      Review of Systems:  Constitutional:  No  fever, no chills, No recent illness, feels well today   Exam:  BP 115/76   Pulse 85   Wt 188 lb (85.3 kg)   BMI 32.27 kg/m   Constitutional: VS see above. General Appearance: alert, well-developed, well-nourished, NAD  GYN: No lesions/ulcers to external genitalia, normal urethra, normal vaginal mucosa, physiologic discharge, cervix normal without lesions,IUD strings visible    ASSESSMENT/PLAN: The encounter diagnosis was Encounter for routine checking of intrauterine contraceptive device (IUD).     All questions at time of visit were answered - patient instructed to contact office with any additional concerns. ER/RTC precautions were reviewed with the patient. Follow-up plan: Return for Routine care with PCP, as needed for suspected IUD problems .  Note: Total time spent 5 minutes, greater than 50% of the visit was spent face-to-face counseling and coordinating care for the following: The encounter diagnosis was Encounter for routine checking of intrauterine contraceptive device (IUD).[name]

## 2016-09-07 ENCOUNTER — Ambulatory Visit: Payer: 59 | Admitting: Physician Assistant

## 2016-09-14 ENCOUNTER — Ambulatory Visit: Payer: 59 | Admitting: Physician Assistant

## 2016-09-15 ENCOUNTER — Ambulatory Visit (INDEPENDENT_AMBULATORY_CARE_PROVIDER_SITE_OTHER): Payer: 59 | Admitting: Physician Assistant

## 2016-09-15 ENCOUNTER — Encounter: Payer: Self-pay | Admitting: Physician Assistant

## 2016-09-15 VITALS — BP 103/65 | HR 93 | Ht 64.0 in | Wt 187.0 lb

## 2016-09-15 DIAGNOSIS — F53 Postpartum depression: Secondary | ICD-10-CM | POA: Insufficient documentation

## 2016-09-15 DIAGNOSIS — F3162 Bipolar disorder, current episode mixed, moderate: Secondary | ICD-10-CM | POA: Diagnosis not present

## 2016-09-15 DIAGNOSIS — O99345 Other mental disorders complicating the puerperium: Secondary | ICD-10-CM | POA: Insufficient documentation

## 2016-09-15 MED ORDER — BUPROPION HCL ER (XL) 150 MG PO TB24: ORAL_TABLET | ORAL | 1 refills | Status: DC

## 2016-09-15 MED ORDER — FLUOXETINE HCL 20 MG PO TABS: ORAL_TABLET | ORAL | 1 refills | Status: DC

## 2016-09-15 NOTE — Patient Instructions (Signed)
Postpartum Depression and Baby Blues The postpartum period begins right after the birth of a baby. During this time, there is often a great amount of joy and excitement. It is also a time of many changes in the life of the parents. Regardless of how many times a mother gives birth, each child brings new challenges and dynamics to the family. It is not unusual to have feelings of excitement along with confusing shifts in moods, emotions, and thoughts. All mothers are at risk of developing postpartum depression or the "baby blues." These mood changes can occur right after giving birth, or they may occur many months after giving birth. The baby blues or postpartum depression can be mild or severe. Additionally, postpartum depression can go away rather quickly, or it can be a long-term condition. What are the causes? Raised hormone levels and the rapid drop in those levels are thought to be a main cause of postpartum depression and the baby blues. A number of hormones change during and after pregnancy. Estrogen and progesterone usually decrease right after the delivery of your baby. The levels of thyroid hormone and various cortisol steroids also rapidly drop. Other factors that play a role in these mood changes include major life events and genetics. What increases the risk? If you have any of the following risks for the baby blues or postpartum depression, know what symptoms to watch out for during the postpartum period. Risk factors that may increase the likelihood of getting the baby blues or postpartum depression include:  Having a personal or family history of depression.  Having depression while being pregnant.  Having premenstrual mood issues or mood issues related to oral contraceptives.  Having a lot of life stress.  Having marital conflict.  Lacking a social support network.  Having a baby with special needs.  Having health problems, such as diabetes.  What are the signs or  symptoms? Symptoms of baby blues include:  Brief changes in mood, such as going from extreme happiness to sadness.  Decreased concentration.  Difficulty sleeping.  Crying spells, tearfulness.  Irritability.  Anxiety.  Symptoms of postpartum depression typically begin within the first month after giving birth. These symptoms include:  Difficulty sleeping or excessive sleepiness.  Marked weight loss.  Agitation.  Feelings of worthlessness.  Lack of interest in activity or food.  Postpartum psychosis is a very serious condition and can be dangerous. Fortunately, it is rare. Displaying any of the following symptoms is cause for immediate medical attention. Symptoms of postpartum psychosis include:  Hallucinations and delusions.  Bizarre or disorganized behavior.  Confusion or disorientation.  How is this diagnosed? A diagnosis is made by an evaluation of your symptoms. There are no medical or lab tests that lead to a diagnosis, but there are various questionnaires that a health care provider may use to identify those with the baby blues, postpartum depression, or psychosis. Often, a screening tool called the Edinburgh Postnatal Depression Scale is used to diagnose depression in the postpartum period. How is this treated? The baby blues usually goes away on its own in 1-2 weeks. Social support is often all that is needed. You will be encouraged to get adequate sleep and rest. Occasionally, you may be given medicines to help you sleep. Postpartum depression requires treatment because it can last several months or longer if it is not treated. Treatment may include individual or group therapy, medicine, or both to address any social, physiological, and psychological factors that may play a role in the   depression. Regular exercise, a healthy diet, rest, and social support may also be strongly recommended. Postpartum psychosis is more serious and needs treatment right away.  Hospitalization is often needed. Follow these instructions at home:  Get as much rest as you can. Nap when the baby sleeps.  Exercise regularly. Some women find yoga and walking to be beneficial.  Eat a balanced and nourishing diet.  Do little things that you enjoy. Have a cup of tea, take a bubble bath, read your favorite magazine, or listen to your favorite music.  Avoid alcohol.  Ask for help with household chores, cooking, grocery shopping, or running errands as needed. Do not try to do everything.  Talk to people close to you about how you are feeling. Get support from your partner, family members, friends, or other new moms.  Try to stay positive in how you think. Think about the things you are grateful for.  Do not spend a lot of time alone.  Only take over-the-counter or prescription medicine as directed by your health care provider.  Keep all your postpartum appointments.  Let your health care provider know if you have any concerns. Contact a health care provider if: You are having a reaction to or problems with your medicine. Get help right away if:  You have suicidal feelings.  You think you may harm the baby or someone else. This information is not intended to replace advice given to you by your health care provider. Make sure you discuss any questions you have with your health care provider. Document Released: 02/06/2004 Document Revised: 10/10/2015 Document Reviewed: 02/13/2013 Elsevier Interactive Patient Education  2017 Elsevier Inc.  

## 2016-09-15 NOTE — Progress Notes (Signed)
   Subjective:    Patient ID: [name], female    DOB: 05/02/1981, 36 y.o.   MRN: [id number]  HPI  Pt is a 36 yo female with bipolar 1 mixed who presents to the clinic to discuss worsening depression and anxiety. She is 5 months post-partum. Her mood started worsening about a month ago and now it is really bad. Before pregnancy she was controlled on prozac and wellbutrin. She thought she could do it on her on and now she knows she can't. She is having thoughts she would be better off dead but not plan. She does not want to kill herself because she wants to be there for her daughter. She have paralyzing guilt. She wishes she had never gotten pregnant but then that makes her feel so bad. She denies any feelings of harm towards her daughter. She works full time. She is really stress at work.    Review of Systems    see HPI.  Objective:   Physical Exam  Constitutional: She is oriented to person, place, and time. She appears well-developed and well-nourished.  HENT:  Head: Normocephalic and atraumatic.  Cardiovascular: Normal rate, regular rhythm and normal heart sounds.   Pulmonary/Chest: Effort normal and breath sounds normal.  Neurological: She is alert and oriented to person, place, and time.  Psychiatric:  Very tearful.           Assessment & Plan:  [name] KitchenMarland KitchenKeayra was seen today for depression.  Diagnoses and all orders for this visit:  Bipolar 1 disorder, mixed, moderate (HCC) -     buPROPion (WELLBUTRIN XL) 150 MG 24 hr tablet; Take one tablet for 2 weeks then increase to 2 tablets daily. -     FLUoxetine (PROZAC) 20 MG tablet; Take 1/2 tablet for first 7 days then take one tablet.  Postpartum depression -     buPROPion (WELLBUTRIN XL) 150 MG 24 hr tablet; Take one tablet for 2 weeks then increase to 2 tablets daily. -     FLUoxetine (PROZAC) 20 MG tablet; Take 1/2 tablet for first 7 days then take one tablet.   .. Depression screen [facility] 2/9 09/15/2016  Decreased Interest 3   Down, Depressed, Hopeless 3  PHQ - 2 Score 6  Altered sleeping 3  Tired, decreased energy 3  Change in appetite 3  Feeling bad or failure about yourself  3  Trouble concentrating 2  Moving slowly or fidgety/restless 3  Suicidal thoughts 2  PHQ-9 Score 25   .[name] GAD 7 : Generalized Anxiety Score 09/15/2016  Nervous, Anxious, on Edge 3  Control/stop worrying 3  Worry too much - different things 3  Trouble relaxing 3  Restless 3  Easily annoyed or irritable 3  Afraid - awful might happen 3  Total GAD 7 Score 21    Restarted previous medications. Follow up in 4 weeks. May consider additional mood stabilizer as needed.  Consider counseling.  Follow up if symptoms worsen.

## 2016-09-16 ENCOUNTER — Other Ambulatory Visit: Payer: Self-pay | Admitting: *Deleted

## 2016-09-16 ENCOUNTER — Telehealth: Payer: Self-pay | Admitting: Physician Assistant

## 2016-09-16 MED ORDER — BUPROPION HCL ER (XL) 300 MG PO TB24: ORAL_TABLET | ORAL | 1 refills | Status: DC

## 2016-09-16 NOTE — Telephone Encounter (Signed)
Received clinical questions answered them and submitted PA request through cover my meds now waiting on determination. - CF °

## 2016-09-16 NOTE — Telephone Encounter (Signed)
Received request for Bupropion sent through cover my meds waiting on clinical questions to finish PA. - CF

## 2016-09-17 NOTE — Telephone Encounter (Signed)
Received fax from [place] and they have approved coverage on Bupropion from 09/16/16 - 09/16/17. I will call pharmacy and let them know. - CF

## 2016-10-13 ENCOUNTER — Ambulatory Visit: Payer: 59 | Admitting: Physician Assistant

## 2016-10-20 ENCOUNTER — Encounter: Payer: Self-pay | Admitting: Physician Assistant

## 2016-10-20 ENCOUNTER — Ambulatory Visit (INDEPENDENT_AMBULATORY_CARE_PROVIDER_SITE_OTHER): Payer: 59 | Admitting: Physician Assistant

## 2016-10-20 VITALS — BP 109/72 | HR 83 | Ht 64.0 in | Wt 181.0 lb

## 2016-10-20 DIAGNOSIS — F53 Postpartum depression: Secondary | ICD-10-CM

## 2016-10-20 DIAGNOSIS — F411 Generalized anxiety disorder: Secondary | ICD-10-CM | POA: Diagnosis not present

## 2016-10-20 DIAGNOSIS — O99345 Other mental disorders complicating the puerperium: Secondary | ICD-10-CM

## 2016-10-20 DIAGNOSIS — F3162 Bipolar disorder, current episode mixed, moderate: Secondary | ICD-10-CM

## 2016-10-20 DIAGNOSIS — F603 Borderline personality disorder: Secondary | ICD-10-CM | POA: Diagnosis not present

## 2016-10-20 DIAGNOSIS — F331 Major depressive disorder, recurrent, moderate: Secondary | ICD-10-CM

## 2016-10-20 MED ORDER — FLUOXETINE HCL 20 MG PO TABS: ORAL_TABLET | ORAL | 2 refills | Status: DC

## 2016-10-20 MED ORDER — HYDROXYZINE HCL 10 MG PO TABS: 10.0000 mg | ORAL_TABLET | Freq: Three times a day (TID) | ORAL | 1 refills | Status: DC | PRN

## 2016-10-20 MED ORDER — BUPROPION HCL ER (XL) 300 MG PO TB24: ORAL_TABLET | ORAL | 2 refills | Status: DC

## 2016-10-20 NOTE — Patient Instructions (Signed)
Increase prozac to 20mg , continue wellbutrin, vistaril as needed.

## 2016-10-20 NOTE — Progress Notes (Signed)
Subjective:    Patient ID: [name], female    DOB: [birth year], 36 y.o.   MRN: [id number]  HPI Pt is a 36 yo female who presents to the clinic to follow up on mood after restarting wellbutrin 300mg  and prozac 10mg  daily. She is doing much better but still has days where she feels down and not motivated. Her anxiety is also pretty bad right now. She constantly worries about something happening to her 62 month old daughter. At night she has problems going to sleep. She denies any sucidal or homicidal thoughts.   .. Active Ambulatory Problems    Diagnosis Date Noted  . Left breast lump 03/05/2015  . Nipple discharge 03/05/2015  . Borderline personality disorder 07/09/2015  . Generalized anxiety disorder 07/09/2015  . Depression 07/09/2015  . Bipolar 1 disorder, mixed, moderate ([place]) 07/09/2015  . [redacted] weeks gestation of pregnancy 03/20/2016  . Iron deficiency anemia 03/20/2016  . Normal pregnancy 06/03/2016  . Encounter for insertion of intrauterine contraceptive device (IUD) 08/05/2016  . Postpartum depression 09/15/2016   Resolved Ambulatory Problems    Diagnosis Date Noted  . No Resolved Ambulatory Problems   Past Medical History:  Diagnosis Date  . History of bipolar disorder   . History of pancreatitis   . Hx of varicella   . Vaginal Pap smear, abnormal       Review of Systems  All other systems reviewed and are negative.      Objective:   Physical Exam  Constitutional: She is oriented to person, place, and time. She appears well-developed and well-nourished.  HENT:  Head: Normocephalic and atraumatic.  Cardiovascular: Normal rate, regular rhythm and normal heart sounds.   Pulmonary/Chest: Effort normal and breath sounds normal.  Neurological: She is alert and oriented to person, place, and time.  Psychiatric: She has a normal mood and affect. Her behavior is normal.          Assessment & Plan:  [name] KitchenMarland KitchenDiagnoses and all orders for this visit:  Moderate  episode of recurrent major depressive disorder (HCC) -     buPROPion (WELLBUTRIN XL) 300 MG 24 hr tablet; Take 1  whole tab daily. -     FLUoxetine (PROZAC) 20 MG tablet; take one tablet daily.  Bipolar 1 disorder, mixed, moderate ([facility]CC) -     buPROPion (WELLBUTRIN XL) 300 MG 24 hr tablet; Take 1  whole tab daily. -     FLUoxetine (PROZAC) 20 MG tablet; take one tablet daily.  Postpartum depression -     buPROPion (WELLBUTRIN XL) 300 MG 24 hr tablet; Take 1  whole tab daily. -     FLUoxetine (PROZAC) 20 MG tablet; take one tablet daily.  Generalized anxiety disorder -     hydrOXYzine (ATARAX/VISTARIL) 10 MG tablet; Take 1 tablet (10 mg total) by mouth 3 (three) times daily as needed. -     buPROPion (WELLBUTRIN XL) 300 MG 24 hr tablet; Take 1  whole tab daily. -     FLUoxetine (PROZAC) 20 MG tablet; take one tablet daily.  Borderline personality disorder -     buPROPion (WELLBUTRIN XL) 300 MG 24 hr tablet; Take 1  whole tab daily. -     FLUoxetine (PROZAC) 20 MG tablet; take one tablet daily.   .. Depression screen [facility] 2/9 10/20/2016 09/15/2016  Decreased Interest 1 3  Down, Depressed, Hopeless 2 3  PHQ - 2 Score 3 6  Altered sleeping 2 3  Tired, decreased energy 3 3  Change in appetite 2 3  Feeling bad or failure about yourself  1 3  Trouble concentrating 1 2  Moving slowly or fidgety/restless 2 3  Suicidal thoughts 1 2  PHQ-9 Score 15 25   .[name] GAD 7 : Generalized Anxiety Score 10/20/2016 09/15/2016  Nervous, Anxious, on Edge 3 3  Control/stop worrying 3 3  Worry too much - different things 3 3  Trouble relaxing 3 3  Restless 2 3  Easily annoyed or irritable 2 3  Afraid - awful might happen 3 3  Total GAD 7 Score 19 21  Anxiety Difficulty Somewhat difficult -    Increase prozac to 20mg  daily. Added vistaril 10mg  as needed for anxiety. Encouraged exercise regularly. Follow up as needed or in 3 months.  May need to consider mood stablizer such as lamictal in the future if not  improving.

## 2017-03-12 ENCOUNTER — Encounter: Payer: Self-pay | Admitting: Physician Assistant

## 2017-03-12 ENCOUNTER — Ambulatory Visit (INDEPENDENT_AMBULATORY_CARE_PROVIDER_SITE_OTHER): Payer: 59 | Admitting: Physician Assistant

## 2017-03-12 VITALS — BP 129/71 | HR 68 | Wt 186.0 lb

## 2017-03-12 DIAGNOSIS — Z0000 Encounter for general adult medical examination without abnormal findings: Secondary | ICD-10-CM

## 2017-03-12 DIAGNOSIS — F411 Generalized anxiety disorder: Secondary | ICD-10-CM

## 2017-03-12 DIAGNOSIS — F53 Postpartum depression: Secondary | ICD-10-CM | POA: Diagnosis not present

## 2017-03-12 DIAGNOSIS — Z13220 Encounter for screening for lipoid disorders: Secondary | ICD-10-CM

## 2017-03-12 DIAGNOSIS — Z131 Encounter for screening for diabetes mellitus: Secondary | ICD-10-CM | POA: Diagnosis not present

## 2017-03-12 DIAGNOSIS — F3162 Bipolar disorder, current episode mixed, moderate: Secondary | ICD-10-CM | POA: Diagnosis not present

## 2017-03-12 DIAGNOSIS — F331 Major depressive disorder, recurrent, moderate: Secondary | ICD-10-CM

## 2017-03-12 DIAGNOSIS — F603 Borderline personality disorder: Secondary | ICD-10-CM

## 2017-03-12 DIAGNOSIS — O99345 Other mental disorders complicating the puerperium: Secondary | ICD-10-CM

## 2017-03-12 DIAGNOSIS — D229 Melanocytic nevi, unspecified: Secondary | ICD-10-CM | POA: Diagnosis not present

## 2017-03-12 DIAGNOSIS — J0100 Acute maxillary sinusitis, unspecified: Secondary | ICD-10-CM

## 2017-03-12 MED ORDER — BUPROPION HCL ER (XL) 300 MG PO TB24: ORAL_TABLET | ORAL | 1 refills | Status: DC

## 2017-03-12 MED ORDER — FLUOXETINE HCL 40 MG PO CAPS: 40.0000 mg | ORAL_CAPSULE | Freq: Every day | ORAL | 1 refills | Status: DC

## 2017-03-12 MED ORDER — AZITHROMYCIN 250 MG PO TABS: ORAL_TABLET | ORAL | 0 refills | Status: DC

## 2017-03-12 NOTE — Progress Notes (Addendum)
Subjective:     [name] is a 36 y.o. female and is here for a comprehensive physical exam. The patient reports problems - pt has off and on sinus pressure, congestion, cough. usually assoicated with season changes. symptoms started 5 days ago. she is taking tylenol cold,/sinus severe. with some relief. no fever, chills, SOB, wheezing.  pt also had nodule on left temple that she feels like getting bigger. Does not bleed, hurt or itch.     Social History   Social History  . Marital status: Single    Spouse name: N/A  . Number of children: N/A  . Years of education: N/A   Occupational History  . Not on file.   Social History Main Topics  . Smoking status: Former Smoker    Packs/day: 0.50    Types: Cigarettes    Quit date: 09/16/2015  . Smokeless tobacco: Never Used  . Alcohol use 1.2 oz/week    2 Standard drinks or equivalent per week  . Drug use: Unknown  . Sexual activity: Yes   Other Topics Concern  . Not on file   Social History Narrative  . No narrative on file   Health Maintenance  Topic Date Due  . PAP SMEAR  04/14/2017 (Originally 06/12/2001)  . INFLUENZA VACCINE  03/12/2018 (Originally 12/16/2016)  . TETANUS/TDAP  05/18/2021  . HIV Screening  Completed    The following portions of the patient's history were reviewed and updated as appropriate: allergies, current medications, past family history, past medical history, past social history, past surgical history and problem list.  Review of Systems Pertinent items noted in HPI and remainder of comprehensive ROS otherwise negative.   Objective:    BP 129/71   Pulse 68   Wt 186 lb (84.4 kg)   BMI 31.93 kg/m  General appearance: alert, cooperative and appears stated age Head: Normocephalic, without obvious abnormality, atraumatic Eyes: conjunctivae/corneas clear. PERRL, EOM's intact. Fundi benign. Ears: normal TM's and external ear canals both ears Nose: no discharge, turbinates red, edematous, inflamed, no  sinus tenderness Throat: lips, mucosa, and tongue normal; teeth and gums normal Neck: no adenopathy, no carotid bruit, no JVD, supple, symmetrical, trachea midline and thyroid not enlarged, symmetric, no tenderness/mass/nodules Back: symmetric, no curvature. ROM normal. No CVA tenderness. Lungs: clear to auscultation bilaterally Heart: regular rate and rhythm, S1, S2 normal, no murmur, click, rub or gallop Abdomen: soft, non-tender; bowel sounds normal; no masses,  no organomegaly Extremities: extremities normal, atraumatic, no cyanosis or edema Pulses: 2+ and symmetric Skin: Skin color, texture, turgor normal. No rashes or lesions Lymph nodes: Cervical, supraclavicular, and axillary nodes normal. Neurologic: Alert and oriented X 3, normal strength and tone. Normal symmetric reflexes. Normal coordination and gait    Assessment:    Healthy female exam.      Plan:    [name] KitchenMarland KitchenDonya was seen today for annual exam.  Diagnoses and all orders for this visit:  Routine physical examination -     CBC with Differential/Platelet -     Lipid Panel w/reflex Direct LDL -     COMPLETE METABOLIC PANEL WITH GFR -     TSH  Bipolar 1 disorder, mixed, moderate (HCC) -     buPROPion (WELLBUTRIN XL) 300 MG 24 hr tablet; Take 1  whole tab daily. -     FLUoxetine (PROZAC) 40 MG capsule; Take 1 capsule (40 mg total) by mouth daily.  Postpartum depression -     buPROPion (WELLBUTRIN XL) 300 MG 24  hr tablet; Take 1  whole tab daily. -     FLUoxetine (PROZAC) 40 MG capsule; Take 1 capsule (40 mg total) by mouth daily.  Moderate episode of recurrent major depressive disorder (HCC) -     buPROPion (WELLBUTRIN XL) 300 MG 24 hr tablet; Take 1  whole tab daily. -     FLUoxetine (PROZAC) 40 MG capsule; Take 1 capsule (40 mg total) by mouth daily.  Generalized anxiety disorder -     buPROPion (WELLBUTRIN XL) 300 MG 24 hr tablet; Take 1  whole tab daily. -     FLUoxetine (PROZAC) 40 MG capsule; Take 1 capsule (40 mg  total) by mouth daily.  Borderline personality disorder ([place]) -     buPROPion (WELLBUTRIN XL) 300 MG 24 hr tablet; Take 1  whole tab daily. -     FLUoxetine (PROZAC) 40 MG capsule; Take 1 capsule (40 mg total) by mouth daily.  Screening for lipid disorders -     Lipid Panel w/reflex Direct LDL  Screening for diabetes mellitus -     COMPLETE METABOLIC PANEL WITH GFR  Acute non-recurrent maxillary sinusitis -     azithromycin (ZITHROMAX) 250 MG tablet; Take 2 tablets now and then one tablet for 4 days.  Nevus   .[name] Depression screen [facility] 2/9 03/14/2017 10/20/2016 09/15/2016  Decreased Interest 1 1 3   Down, Depressed, Hopeless 1 2 3   PHQ - 2 Score 2 3 6   Altered sleeping 0 2 3  Tired, decreased energy 1 3 3   Change in appetite 1 2 3   Feeling bad or failure about yourself  1 1 3   Trouble concentrating 1 1 2   Moving slowly or fidgety/restless 0 2 3  Suicidal thoughts 0 1 2  PHQ-9 Score 6 15 25   Difficult doing work/chores Somewhat difficult - -   Decided to increase prozac.   Fasting labs ordered.   Pt declined pap and breast exam today.   .. Discussed 150 minutes of exercise a week.  Encouraged vitamin D 1000 units and Calcium 1300mg  or 4 servings of dairy a day.   [name].Discussed low carb diet with 1500 calories and 80g of protein.  Exercising at least 150 minutes a week.  [employer] could be a Microbiologist.   Discussed recent ilness which appears as sinusitis. I still suspect could be viral. Discussed symptomatic care. Given zpak due to being weekend to use if not improving.   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 Pacemaker? no.  Patient informed of risks (permanent scarring, infection, light or dark discoloration, bleeding, infection, weakness, numbness  and recurrence of the lesion) and benefits of the procedure and verbal informed consent obtained.  The areas are treated with liquid nitrogen therapy, frozen until ice ball extended 2 mm beyond lesion, allowed to thaw, and treated again. The patient tolerated procedure well.  The patient was instructed on post-op care, warned that there may be blister formation, redness and pain. Recommend OTC analgesia as needed for pain.  Condition: Stable  Complications: none.  Plan: 1. Instructed to keep the area dry and covered for 24-48h and clean thereafter. 2. Warning signs of infection were reviewed.   3. Recommended that the patient use OTC acetaminophen as needed for pain.     See After Visit Summary for Counseling Recommendations

## 2017-03-12 NOTE — Patient Instructions (Signed)

## 2017-03-15 DIAGNOSIS — D229 Melanocytic nevi, unspecified: Secondary | ICD-10-CM | POA: Insufficient documentation

## 2017-03-19 NOTE — Progress Notes (Signed)
Call pt: thyroid normal hgb great

## 2017-03-24 LAB — CBC WITH DIFFERENTIAL/PLATELET
Basophils Absolute: 123 cells/uL (ref 0–200)
Basophils Relative: 1.5 %
Eosinophils Absolute: 131 cells/uL (ref 15–500)
Eosinophils Relative: 1.6 %
HCT: 40.3 % (ref 35.0–45.0)
Hemoglobin: 13.8 g/dL (ref 11.7–15.5)
Lymphs Abs: 1460 cells/uL (ref 850–3900)
MCH: 29.6 pg (ref 27.0–33.0)
MCHC: 34.2 g/dL (ref 32.0–36.0)
MCV: 86.3 fL (ref 80.0–100.0)
MPV: 10 fL (ref 7.5–12.5)
Monocytes Relative: 7.4 %
Neutro Abs: 5879 cells/uL (ref 1500–7800)
Neutrophils Relative %: 71.7 %
Platelets: 447 10*3/uL — ABNORMAL HIGH (ref 140–400)
RBC: 4.67 10*6/uL (ref 3.80–5.10)
RDW: 12.5 % (ref 11.0–15.0)
Total Lymphocyte: 17.8 %
WBC mixed population: 607 cells/uL (ref 200–950)
WBC: 8.2 10*3/uL (ref 3.8–10.8)

## 2017-03-24 LAB — COMPLETE METABOLIC PANEL WITH GFR
AG Ratio: 1.9 (calc) (ref 1.0–2.5)
ALT: 18 U/L (ref 6–29)
AST: 16 U/L (ref 10–30)
Albumin: 4.5 g/dL (ref 3.6–5.1)
Alkaline phosphatase (APISO): 94 U/L (ref 33–115)
BUN: 10 mg/dL (ref 7–25)
CO2: 22 mmol/L (ref 20–32)
Calcium: 9.2 mg/dL (ref 8.6–10.2)
Chloride: 104 mmol/L (ref 98–110)
Creat: 0.87 mg/dL (ref 0.50–1.10)
GFR, Est African American: 99 mL/min/{1.73_m2} (ref >=60)
GFR, Est Non African American: 86 mL/min/{1.73_m2} (ref >=60)
Globulin: 2.4 g/dL (calc) (ref 1.9–3.7)
Glucose, Bld: 111 mg/dL — ABNORMAL HIGH (ref 65–99)
Potassium: 3.7 mmol/L (ref 3.5–5.3)
Sodium: 137 mmol/L (ref 135–146)
Total Bilirubin: 0.3 mg/dL (ref 0.2–1.2)
Total Protein: 6.9 g/dL (ref 6.1–8.1)

## 2017-03-24 LAB — LIPID PANEL W/REFLEX DIRECT LDL
Cholesterol: 147 mg/dL (ref <200)
HDL: 42 mg/dL — ABNORMAL LOW (ref >50)
LDL Cholesterol (Calc): 83 mg/dL (calc)
Non-HDL Cholesterol (Calc): 105 mg/dL (calc) (ref <130)
Total CHOL/HDL Ratio: 3.5 (calc) (ref <5.0)
Triglycerides: 128 mg/dL (ref <150)

## 2017-03-24 LAB — HEMOGLOBIN A1C W/OUT EAG: Hgb A1c MFr Bld: 5.1 % of total Hgb (ref <5.7)

## 2017-03-24 LAB — TSH: TSH: 0.77 mIU/L

## 2017-07-15 LAB — HM PAP SMEAR

## 2017-09-30 ENCOUNTER — Encounter: Payer: Self-pay | Admitting: Physician Assistant

## 2017-09-30 ENCOUNTER — Ambulatory Visit (INDEPENDENT_AMBULATORY_CARE_PROVIDER_SITE_OTHER): Payer: 59 | Admitting: Physician Assistant

## 2017-09-30 VITALS — BP 110/70 | HR 83

## 2017-09-30 DIAGNOSIS — F411 Generalized anxiety disorder: Secondary | ICD-10-CM | POA: Diagnosis not present

## 2017-09-30 DIAGNOSIS — G43109 Migraine with aura, not intractable, without status migrainosus: Secondary | ICD-10-CM

## 2017-09-30 DIAGNOSIS — F3162 Bipolar disorder, current episode mixed, moderate: Secondary | ICD-10-CM

## 2017-09-30 MED ORDER — RIZATRIPTAN BENZOATE 10 MG PO TABS: 10.0000 mg | ORAL_TABLET | ORAL | 1 refills | Status: DC | PRN

## 2017-09-30 MED ORDER — TOPIRAMATE ER 50 MG PO CAP24: 1.0000 | ORAL_CAPSULE | Freq: Every day | ORAL | 1 refills | Status: DC

## 2017-09-30 MED ORDER — KETOROLAC TROMETHAMINE 60 MG/2ML IM SOLN
60.0000 mg | Freq: Once | INTRAMUSCULAR | Status: AC
  Administered 2017-09-30: 60 mg via INTRAMUSCULAR

## 2017-09-30 MED ORDER — LAMOTRIGINE 25 MG PO TABS: ORAL_TABLET | ORAL | 1 refills | Status: DC

## 2017-09-30 NOTE — Progress Notes (Signed)
Subjective:    Patient ID: [name], female    DOB: [birth year], 37 y.o.   MRN: [id number]  HPI  Pt is a 37 yo female with bipolar 1 mixed, GAD who presents to the clinic with worsening headaches.   Pt is not taking any medication for bipolar/anxiety/depression. She is doing ok. She denies any SI/HC. She has good and bad days. She has tried  Prozac/wellbutrin/lexapro/celexa and she has not found one that she really thinks helps situation.   Headaches-around the orbit. Worse-random woke up this morning with it. contstant all day. Nothing helps it. She has tried OTC NSAIDs and excedrin.No vision changes. Started in [place]. Used to be every now and then has worsened to weekly. Denies any nausea or vomiting. No hearing loss. When coming on she always feels it in the orbit of either eye.   .. Active Ambulatory Problems    Diagnosis Date Noted  . Left breast lump 03/05/2015  . Nipple discharge 03/05/2015  . Borderline personality disorder ([place]) 07/09/2015  . Generalized anxiety disorder 07/09/2015  . Depression 07/09/2015  . Bipolar 1 disorder, mixed, moderate ([place]) 07/09/2015  . Iron deficiency anemia 03/20/2016  . Normal pregnancy 06/03/2016  . Encounter for insertion of intrauterine contraceptive device (IUD) 08/05/2016  . 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 Nevus 03/15/2017  . Ocular migraine 10/04/2017   Resolved Ambulatory Problems    Diagnosis Date Noted  . [redacted] weeks gestation of pregnancy 03/20/2016   Past Medical History:  Diagnosis Date  . History of bipolar disorder   . History of pancreatitis   . Hx of varicella   . Vaginal Pap smear, abnormal       Review of Systems  All other systems reviewed and are negative.      Objective:   Physical Exam  Constitutional: She appears well-developed and well-nourished.  HENT:  Head: Normocephalic and atraumatic.  Eyes: Pupils are equal, round, and reactive to light. Conjunctivae and EOM are normal.  Neck: Normal  range of motion. Neck supple. No thyromegaly present.  Cardiovascular: Normal rate and regular rhythm.  Pulmonary/Chest: Effort normal and breath sounds normal.  Psychiatric: She has a normal mood and affect. Her behavior is normal.          Assessment & Plan:  [name] KitchenMarland KitchenDiagnoses and all orders for this visit:  Ocular migraine -     rizatriptan (MAXALT) 10 MG tablet; Take 1 tablet (10 mg total) by mouth as needed for migraine. May repeat in 2 hours if needed -     Topiramate ER (TROKENDI XR) 50 MG CP24; Take 1 tablet by mouth daily. -     ketorolac (TORADOL) injection 60 mg  Bipolar 1 disorder, mixed, moderate (HCC) -     lamoTRIgine (LAMICTAL) 25 MG tablet; Take one tablet daily for one week then increase by one tablet weekly until taking 100mg (4 tablets daily)  Generalized anxiety disorder -     lamoTRIgine (LAMICTAL) 25 MG tablet; Take one tablet daily for one week then increase by one tablet weekly until taking 100mg (4 tablets daily)   .[name] Depression screen [facility] 2/9 09/30/2017 03/14/2017 10/20/2016 09/15/2016  Decreased Interest 1 1 1 3   Down, Depressed, Hopeless 1 1 2 3   PHQ - 2 Score 2 2 3 6   Altered sleeping 2 0 2 3  Tired, decreased energy 2 1 3 3   Change in appetite 2 1 2 3   Feeling bad or failure about yourself  1 1 1 3   Trouble concentrating  1 1 1 2   Moving slowly or fidgety/restless 2 0 2 3  Suicidal thoughts 1 0 1 2  PHQ-9 Score 13 6 15 25   Difficult doing work/chores Somewhat difficult Somewhat difficult - -   .[name] GAD 7 : Generalized Anxiety Score 09/30/2017 10/20/2016 09/15/2016  Nervous, Anxious, on Edge 2 3 3   Control/stop worrying 3 3 3   Worry too much - different things 3 3 3   Trouble relaxing 2 3 3   Restless 2 2 3   Easily annoyed or irritable 3 2 3   Afraid - awful might happen 3 3 3   Total GAD 7 Score 18 19 21   Anxiety Difficulty Somewhat difficult Somewhat difficult -    Added lamictal. Discussed side effects. Increase by 25mg  until get to 100mg   Start  trokendi. maxalt for rescue. Follow up in 1-2 months. Seems like orbital migraine. HO given. Consider eye appt to see if needs glasses.   [name].Spent 30 minutes with patient and greater than 50 percent of visit spent counseling patient regarding treatment plan.

## 2017-10-04 ENCOUNTER — Encounter: Payer: Self-pay | Admitting: Physician Assistant

## 2017-10-04 DIAGNOSIS — G43109 Migraine with aura, not intractable, without status migrainosus: Secondary | ICD-10-CM | POA: Insufficient documentation

## 2017-10-05 ENCOUNTER — Encounter: Payer: Self-pay | Admitting: Physician Assistant

## 2017-10-05 ENCOUNTER — Other Ambulatory Visit: Payer: Self-pay | Admitting: Physician Assistant

## 2017-10-05 DIAGNOSIS — G43109 Migraine with aura, not intractable, without status migrainosus: Secondary | ICD-10-CM

## 2017-10-05 NOTE — Telephone Encounter (Signed)
Call drug rep for this medication. They repeatly tell me I can prescribe this first line. I wanna see if we are doing something wrong.

## 2017-10-05 NOTE — Telephone Encounter (Signed)
Pharmacy states: " Alternative Requested:FORMULARY STEP THERAPY TRY TOPIRAMATE OR CALL PHARMACY HELP DESK AT [phone] FOR APPROVAL"  Change med or do PA?  Please advise

## 2017-10-05 NOTE — Telephone Encounter (Signed)
Insurance will only cover the Topiramate regular and not the extended release.

## 2017-10-19 ENCOUNTER — Ambulatory Visit (INDEPENDENT_AMBULATORY_CARE_PROVIDER_SITE_OTHER): Payer: 59 | Admitting: Physician Assistant

## 2017-10-19 ENCOUNTER — Encounter: Payer: Self-pay | Admitting: Physician Assistant

## 2017-10-19 VITALS — BP 103/62 | HR 76 | Ht 64.0 in | Wt 184.0 lb

## 2017-10-19 DIAGNOSIS — G43109 Migraine with aura, not intractable, without status migrainosus: Secondary | ICD-10-CM | POA: Diagnosis not present

## 2017-10-19 DIAGNOSIS — Z0000 Encounter for general adult medical examination without abnormal findings: Secondary | ICD-10-CM

## 2017-10-19 DIAGNOSIS — E6609 Other obesity due to excess calories: Secondary | ICD-10-CM | POA: Diagnosis not present

## 2017-10-19 DIAGNOSIS — F331 Major depressive disorder, recurrent, moderate: Secondary | ICD-10-CM | POA: Diagnosis not present

## 2017-10-19 DIAGNOSIS — F3162 Bipolar disorder, current episode mixed, moderate: Secondary | ICD-10-CM | POA: Diagnosis not present

## 2017-10-19 DIAGNOSIS — Z6831 Body mass index (BMI) 31.0-31.9, adult: Secondary | ICD-10-CM

## 2017-10-19 DIAGNOSIS — F603 Borderline personality disorder: Secondary | ICD-10-CM

## 2017-10-19 MED ORDER — LAMOTRIGINE 100 MG PO TABS: 100.0000 mg | ORAL_TABLET | Freq: Every day | ORAL | 1 refills | Status: DC

## 2017-10-19 MED ORDER — TOPIRAMATE ER 50 MG PO CAP24: 1.0000 | ORAL_CAPSULE | Freq: Every day | ORAL | 1 refills | Status: DC

## 2017-10-19 NOTE — Progress Notes (Signed)
Subjective:     [name] is a 37 y.o. female and is here for a comprehensive physical exam. The patient reports no problems.  Her migraines have almost completely resolved. She has had one since starting trokendi and it was resolved with maxalt. She was confused and taking trokendi and topamax.  She also has no taste for her sodas that she loved. She was drinking pepsi max 2-3 2L sodas a day and not they taste horrible. She has lost 2lbs. She continues to eat quite a bit of candy.   lamictal is helping her mood a lot. She is very happy at this dose. Her boyfriend has noticed a difference too.   Social History   Socioeconomic History  . Marital status: Single    Spouse name: Not on file  . Number of children: Not on file  . Years of education: Not on file  . Highest education level: Not on file  Occupational History  . Not on file  Social Needs  . Financial resource strain: Not on file  . Food insecurity:    Worry: Not on file    Inability: Not on file  . Transportation needs:    Medical: Not on file    Non-medical: Not on file  Tobacco Use  . Smoking status: Former Smoker    Packs/day: 0.50    Types: Cigarettes    Last attempt to quit: 09/16/2015    Years since quitting: 2.0  . Smokeless tobacco: Never Used  Substance and Sexual Activity  . Alcohol use: Yes    Alcohol/week: 1.2 oz    Types: 2 Standard drinks or equivalent per week  . Drug use: Not on file  . Sexual activity: Yes  Lifestyle  . Physical activity:    Days per week: Not on file    Minutes per session: Not on file  . Stress: Not on file  Relationships  . Social connections:    Talks on phone: Not on file    Gets together: Not on file    Attends religious service: Not on file    Active member of club or organization: Not on file    Attends meetings of clubs or organizations: Not on file    Relationship status: Not on file  . Intimate partner violence:    Fear of current or ex partner: Not on file   Emotionally abused: Not on file    Physically abused: Not on file    Forced sexual activity: Not on file  Other Topics Concern  . Not on file  Social History Narrative  . Not on file   Health Maintenance  Topic Date Due  . PAP SMEAR  06/12/2001  . INFLUENZA VACCINE  03/12/2018 (Originally 12/16/2017)  . TETANUS/TDAP  05/18/2021  . HIV Screening  Completed    The following portions of the patient's history were reviewed and updated as appropriate: allergies, current medications, past family history, past medical history, past social history, past surgical history and problem list.  Review of Systems Pertinent items noted in HPI and remainder of comprehensive ROS otherwise negative.   Objective:    BP 103/62   Pulse 76   Ht 5\' 4"  (1.626 m)   Wt 184 lb (83.5 kg)   BMI 31.58 kg/m  General appearance: alert, cooperative and appears stated age Head: Normocephalic, without obvious abnormality, atraumatic Eyes: conjunctivae/corneas clear. PERRL, EOM's intact. Fundi benign. Ears: normal TM's and external ear canals both ears Nose: Nares normal. Septum midline.  Mucosa normal. No drainage or sinus tenderness. Throat: lips, mucosa, and tongue normal; teeth and gums normal Neck: no adenopathy, no carotid bruit, no JVD, supple, symmetrical, trachea midline and thyroid not enlarged, symmetric, no tenderness/mass/nodules Back: symmetric, no curvature. ROM normal. No CVA tenderness. Lungs: clear to auscultation bilaterally Breasts: normal appearance, no masses or tenderness Heart: regular rate and rhythm, S1, S2 normal, no murmur, click, rub or gallop Abdomen: soft, non-tender; bowel sounds normal; no masses,  no organomegaly Extremities: extremities normal, atraumatic, no cyanosis or edema Pulses: 2+ and symmetric Skin: Skin color, texture, turgor normal. No rashes or lesions Lymph nodes: Cervical, supraclavicular, and axillary nodes normal. Neurologic: Alert and oriented X 3, normal  strength and tone. Normal symmetric reflexes. Normal coordination and gait    Assessment:    Healthy female exam.      Plan:      [name] KitchenMarland KitchenJoyel was seen today for annual exam.  Diagnoses and all orders for this visit:  Routine physical examination  Ocular migraine -     Topiramate ER (TROKENDI XR) 50 MG CP24; Take 1 tablet by mouth daily.  Moderate episode of recurrent major depressive disorder (HCC) -     lamoTRIgine (LAMICTAL) 100 MG tablet; Take 1 tablet (100 mg total) by mouth daily.  Borderline personality disorder ([place]) -     lamoTRIgine (LAMICTAL) 100 MG tablet; Take 1 tablet (100 mg total) by mouth daily.  Bipolar 1 disorder, mixed, moderate (HCC) -     lamoTRIgine (LAMICTAL) 100 MG tablet; Take 1 tablet (100 mg total) by mouth daily.  Class 1 obesity due to excess calories without serious comorbidity with body mass index (BMI) of 31.0 to 31.9 in adult  she did not do a new PHq9 and GAD7. Refilled lamictal. Follow up in 3 months.   Awesome response to trokendi. Follow up in 3 months.   .. Discussed 150 minutes of exercise a week.  Encouraged vitamin D 1000 units and Calcium 1300mg  or 4 servings of dairy a day.  physician for women-need to call and get pap done approximately 1 year ago.  Vaccines up to date.  Continue to work on weight loss.  Labs up to date as well.   See After Visit Summary for Counseling Recommendations

## 2017-10-19 NOTE — Patient Instructions (Addendum)
Trokendi only.   Keeping You Healthy  Get These Tests 1. Blood Pressure- Have your blood pressure checked once a year by your health care provider.  Normal blood pressure is 120/80. 2. Weight- Have your body mass index (BMI) calculated to screen for obesity.  BMI is measure of body fat based on height and weight.  You can also calculate your own BMI at GravelBags.it. 3. Cholesterol- Have your cholesterol checked every 5 years starting at age 37 then yearly starting at age 41. 4. Chlamydia, HIV, and other sexually transmitted diseases- Get screened every year until age 94, then within three months of each new sexual provider. 5. Pap Test - Every 1-5 years; discuss with your health care provider. 6. Mammogram- Every 1-2 years starting at age 47--50  Take these medicines  Calcium with Vitamin D-Your body needs 1200 mg of Calcium each day and [phone] IU of Vitamin D daily.  Your body can only absorb 500 mg of Calcium at a time so Calcium must be taken in 2 or 3 divided doses throughout the day.  Multivitamin with folic acid- Once daily if it is possible for you to become pregnant.  Get these Immunizations  Gardasil-Series of three doses; prevents HPV related illness such as genital warts and cervical cancer.  Menactra-Single dose; prevents meningitis.  Tetanus shot- Every 10 years.  Flu shot-Every year.  Take these steps 1. Do not smoke-Your healthcare provider can help you quit.  For tips on how to quit go to www.smokefree.gov or call 1-800 QUITNOW. 2. Be physically active- Exercise 5 days a week for at least 30 minutes.  If you are not already physically active, start slow and gradually work up to 30 minutes of moderate physical activity.  Examples of moderate activity include walking briskly, dancing, swimming, bicycling, etc. 3. Breast Cancer- A self breast exam every month is important for early detection of breast cancer.  For more information and instruction on self  breast exams, ask your healthcare provider or https://www.patel.info/. 4. Eat a healthy diet- Eat a variety of healthy foods such as fruits, vegetables, whole grains, low fat milk, low fat cheeses, yogurt, lean meats, poultry and fish, beans, nuts, tofu, etc.  For more information go to www. Thenutritionsource.org 5. Drink alcohol in moderation- Limit alcohol intake to one drink or less per day. Never drink and drive. 6. Depression- Your emotional health is as important as your physical health.  If you're feeling down or losing interest in things you normally enjoy please talk to your healthcare provider about being screened for depression. 7. Dental visit- Brush and floss your teeth twice daily; visit your dentist twice a year. 8. Eye doctor- Get an eye exam at least every 2 years. 9. Helmet use- Always wear a helmet when riding a bicycle, motorcycle, rollerblading or skateboarding. 44. Safe sex- If you may be exposed to sexually transmitted infections, use a condom. 11. Seat belts- Seat belts can save your live; always wear one. 12. Smoke/Carbon Monoxide detectors- These detectors need to be installed on the appropriate level of your home. Replace batteries at least once a year. 13. Skin cancer- When out in the sun please cover up and use sunscreen 15 SPF or higher. 14. Violence- If anyone is threatening or hurting you, please tell your healthcare provider.

## 2017-10-27 ENCOUNTER — Encounter: Payer: Self-pay | Admitting: Physician Assistant

## 2017-11-12 ENCOUNTER — Ambulatory Visit: Payer: 59 | Admitting: Physician Assistant

## 2018-01-11 ENCOUNTER — Ambulatory Visit (INDEPENDENT_AMBULATORY_CARE_PROVIDER_SITE_OTHER): Payer: 59 | Admitting: Physician Assistant

## 2018-01-11 ENCOUNTER — Encounter: Payer: Self-pay | Admitting: Physician Assistant

## 2018-01-11 VITALS — BP 113/68 | HR 89 | Ht 64.0 in | Wt 183.0 lb

## 2018-01-11 DIAGNOSIS — F419 Anxiety disorder, unspecified: Secondary | ICD-10-CM | POA: Diagnosis not present

## 2018-01-11 MED ORDER — LORAZEPAM 0.5 MG PO TABS: 0.5000 mg | ORAL_TABLET | Freq: Three times a day (TID) | ORAL | 0 refills | Status: DC | PRN

## 2018-01-11 NOTE — Progress Notes (Signed)
Subjective:    Patient ID: [name], female    DOB: Aug 24, 1980, 37 y.o.   MRN: [id number]  HPI Patient is a 37 year old female known anxiety, depression, borderline personality disorder, bipolar who presents to the clinic with acute anxiety.  She reports doing very well until Monday when her child started preschool for the first time.  She had a lot of trouble leaving her and then she was told her child cried all day long.  She notes her child needs some socialization but she fears what goes on at school.  She is constantly thinking about the worst thing that could happen and if her child would get hurt or not.  This is driving her crazy in her head.  She finds herself very tearful.  She denies any suicidal thoughts or homicidal lobulations.  .. Active Ambulatory Problems    Diagnosis Date Noted  . Left breast lump 03/05/2015  . Nipple discharge 03/05/2015  . Borderline personality disorder ([place]) 07/09/2015  . Generalized anxiety disorder 07/09/2015  . Depression 07/09/2015  . Bipolar 1 disorder, mixed, moderate ([place]) 07/09/2015  . Iron deficiency anemia 03/20/2016  . Normal pregnancy 06/03/2016  . Encounter for insertion of intrauterine contraceptive device (IUD) 08/05/2016  . 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 Nevus 03/15/2017  . Ocular migraine 10/04/2017   Resolved Ambulatory Problems    Diagnosis Date Noted  . [redacted] weeks gestation of pregnancy 03/20/2016   Past Medical History:  Diagnosis Date  . History of bipolar disorder   . History of pancreatitis   . Hx of varicella   . Vaginal Pap smear, abnormal       Review of Systems See HPI.     Objective:   Physical Exam  Constitutional: She is oriented to person, place, and time. She appears well-developed and well-nourished.  HENT:  Head: Normocephalic and atraumatic.  Cardiovascular: Normal rate and regular rhythm.  Neurological: She is alert and oriented to person, place, and time.  Psychiatric: She has a  normal mood and affect.  On edge and tearful.           Assessment & Plan:  [name] KitchenMarland KitchenDiagnoses and all orders for this visit:  Acute anxiety -     LORazepam (ATIVAN) 0.5 MG tablet; Take 1 tablet (0.5 mg total) by mouth every 8 (eight) hours as needed for anxiety.   .. Depression screen [facility] 2/9 01/11/2018 09/30/2017 03/14/2017 10/20/2016 09/15/2016  Decreased Interest 1 1 1 1 3   Down, Depressed, Hopeless 1 1 1 2 3   PHQ - 2 Score 2 2 2 3 6   Altered sleeping 0 2 0 2 3  Tired, decreased energy 1 2 1 3 3   Change in appetite 3 2 1 2 3   Feeling bad or failure about yourself  1 1 1 1 3   Trouble concentrating 2 1 1 1 2   Moving slowly or fidgety/restless 3 2 0 2 3  Suicidal thoughts 1 1 0 1 2  PHQ-9 Score 13 13 6 15 25   Difficult doing work/chores Not difficult at all Somewhat difficult Somewhat difficult - -   .[name] GAD 7 : Generalized Anxiety Score 01/11/2018 09/30/2017 10/20/2016 09/15/2016  Nervous, Anxious, on Edge 3 2 3 3   Control/stop worrying 3 3 3 3   Worry too much - different things 3 3 3 3   Trouble relaxing 3 2 3 3   Restless 3 2 2 3   Easily annoyed or irritable 2 3 2 3   Afraid - awful might happen 3 3  3 3  Total GAD 7 Score 20 18 19 21   Anxiety Difficulty Somewhat difficult Somewhat difficult Somewhat difficult -    Patient's PHQ 9 and GAD 7 scores are elevated however patient reports she was much better controlled before the current trigger of her child going to school.  Discussed increasing her maintenance meds but she declined for now.  Since this is a recent situation and anxiety symptoms we will try to work through this for a few weeks.  I did give her a small quantity of Ativan to use very sparingly.  I discussed abuse potential and dependency.  She declined counseling.  I encouraged her regular exercise.  We discussed cognitive behavioral techniques to work through anxiety.  I encouraged her to consider CBD oil.  Follow-up as needed. If not able to work through this in the next 4-6 week  will consider increasing maintenance medications.   [name].Spent 30 minutes with patient and greater than 50 percent of visit spent counseling patient regarding treatment plan.

## 2018-01-11 NOTE — Patient Instructions (Signed)

## 2018-04-20 ENCOUNTER — Encounter: Payer: Self-pay | Admitting: Physician Assistant

## 2018-04-20 ENCOUNTER — Ambulatory Visit (INDEPENDENT_AMBULATORY_CARE_PROVIDER_SITE_OTHER): Payer: 59 | Admitting: Physician Assistant

## 2018-04-20 VITALS — BP 114/84 | HR 88 | Resp 16 | Ht 64.0 in | Wt 189.9 lb

## 2018-04-20 DIAGNOSIS — F3162 Bipolar disorder, current episode mixed, moderate: Secondary | ICD-10-CM

## 2018-04-20 DIAGNOSIS — E6609 Other obesity due to excess calories: Secondary | ICD-10-CM | POA: Diagnosis not present

## 2018-04-20 DIAGNOSIS — F331 Major depressive disorder, recurrent, moderate: Secondary | ICD-10-CM

## 2018-04-20 DIAGNOSIS — F603 Borderline personality disorder: Secondary | ICD-10-CM | POA: Diagnosis not present

## 2018-04-20 DIAGNOSIS — Z6832 Body mass index (BMI) 32.0-32.9, adult: Secondary | ICD-10-CM | POA: Insufficient documentation

## 2018-04-20 DIAGNOSIS — G43109 Migraine with aura, not intractable, without status migrainosus: Secondary | ICD-10-CM

## 2018-04-20 MED ORDER — LAMOTRIGINE 100 MG PO TABS: 100.0000 mg | ORAL_TABLET | Freq: Every day | ORAL | 1 refills | Status: DC

## 2018-04-20 MED ORDER — LIRAGLUTIDE -WEIGHT MANAGEMENT 18 MG/3ML ~~LOC~~ SOPN: 0.6000 mg | PEN_INJECTOR | Freq: Every day | SUBCUTANEOUS | 1 refills | Status: DC

## 2018-04-20 MED ORDER — TOPIRAMATE ER 100 MG PO CAP24: 1.0000 | ORAL_CAPSULE | Freq: Every day | ORAL | 1 refills | Status: DC

## 2018-04-20 NOTE — Progress Notes (Signed)
Subjective:    Patient ID: [name], female    DOB: [birth year], 37 y.o.   MRN: [id number]  HPI Patient is a 37 year old female with a history of GAD, borderline personality disorder, migraines and depression presenting today for the follow up. She is doing well with Trokendi and Maxalt for migraine control. Reports one migraine every 1-2 weeks. They are not interfering with work. She denies problems with sleep. Her anxiety has improved since last visit. She reports some lingering anxiety, but she is able to cope with it. She is no longer taking ativan.   She is still struggling with weight loss. She has tried Weight Watchers and various diets with no results. She eats a lot of salad and snacks on veggies, but also admits to eating bread, pasta, chocolate, candy and cookies when she is stressed, which is frequently. She admits to large portion sizes and residual hunger after eating. She is very active at [employer] walks 12k steps/day and is active with her 86 year old at home. She feels very discouraged.  .. Active Ambulatory Problems    Diagnosis Date Noted  . Left breast lump 03/05/2015  . Nipple discharge 03/05/2015  . Borderline personality disorder ([place]) 07/09/2015  . Generalized anxiety disorder 07/09/2015  . Depression 07/09/2015  . Bipolar 1 disorder, mixed, moderate ([place]) 07/09/2015  . Iron deficiency anemia 03/20/2016  . Normal pregnancy 06/03/2016  . Encounter for insertion of intrauterine contraceptive device (IUD) 08/05/2016  . 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 Nevus 03/15/2017  . Ocular migraine 10/04/2017   Resolved Ambulatory Problems    Diagnosis Date Noted  . [redacted] weeks gestation of pregnancy 03/20/2016   Past Medical History:  Diagnosis Date  . History of bipolar disorder   . History of pancreatitis   . Hx of varicella   . Vaginal Pap smear, abnormal       Review of Systems  Constitutional: Negative for chills, fatigue and fever.   Psychiatric/Behavioral: Negative for agitation, behavioral problems, confusion and sleep disturbance. The patient is nervous/anxious.        Objective:   Physical Exam  Constitutional: She appears well-developed and well-nourished. No distress.  Cardiovascular: Normal rate and regular rhythm. Exam reveals no gallop and no friction rub.  No murmur heard. Pulmonary/Chest: Effort normal and breath sounds normal. No respiratory distress.  Psychiatric: She has a normal mood and affect. Her behavior is normal. Judgment and thought content normal.          Assessment & Plan:  [name] KitchenMarland KitchenAssessment  .[name] KitchenKenora was seen today for follow-up.  Diagnoses and all orders for this visit:  Class 1 obesity due to excess calories without serious comorbidity with body mass index (BMI) of 32.0 to 32.9 in adult -     Liraglutide -Weight Management (SAXENDA) 18 MG/3ML SOPN; Inject 0.6 mg into the skin daily. For one week then increase by .6mg  weekly until reaches 3mg  daily.  Please include ultra fine needles 12mm  Moderate episode of recurrent major depressive disorder (HCC) -     lamoTRIgine (LAMICTAL) 100 MG tablet; Take 1 tablet (100 mg total) by mouth daily.  Borderline personality disorder ([place]) -     lamoTRIgine (LAMICTAL) 100 MG tablet; Take 1 tablet (100 mg total) by mouth daily.  Bipolar 1 disorder, mixed, moderate (HCC) -     lamoTRIgine (LAMICTAL) 100 MG tablet; Take 1 tablet (100 mg total) by mouth daily.  Ocular migraine -     Topiramate ER (TROKENDI XR)  100 MG CP24; Take 1 tablet by mouth daily.  .. Depression screen [facility] 2/9 04/20/2018 01/11/2018 09/30/2017 03/14/2017 10/20/2016  Decreased Interest 1 1 1 1 1   Down, Depressed, Hopeless 1 1 1 1 2   PHQ - 2 Score 2 2 2 2 3   Altered sleeping 2 0 2 0 2  Tired, decreased energy 2 1 2 1 3   Change in appetite 3 3 2 1 2   Feeling bad or failure about yourself  1 1 1 1 1   Trouble concentrating 1 2 1 1 1   Moving slowly or fidgety/restless 0 3 2 0 2   Suicidal thoughts 0 1 1 0 1  PHQ-9 Score 11 13 13 6 15   Difficult doing work/chores Not difficult at all Not difficult at all Somewhat difficult Somewhat difficult -   .. GAD 7 : Generalized Anxiety Score 04/20/2018 01/11/2018 09/30/2017 10/20/2016  Nervous, Anxious, on Edge 2 3 2 3   Control/stop worrying 2 3 3 3   Worry too much - different things 2 3 3 3   Trouble relaxing 2 3 2 3   Restless 2 3 2 2   Easily annoyed or irritable 3 2 3 2   Afraid - awful might happen 3 3 3 3   Total GAD 7 Score 16 20 18 19   Anxiety Difficulty Somewhat difficult Somewhat difficult Somewhat difficult Somewhat difficult     Migraines: Controlled well w/ Trokendi. Patient is still having migraines every 1-2 weeks. Will increase dose to 100mg .   Depression and Bipolar 1: Continue Lamictal as prescribed. Patient reports doing well on this dose. Will refill prescription.  Weight: Encouraged patient to continue good diet and exercise choices. Discussed starting weight loss medication and increasing dose of Trokendi. Will start Saxenda. Increased dose of Trokendi. Discussed side effects. Discussed titration. Coupon card given.  Follow up in 2 months.  [name].Spent 30 minutes with patient and greater than 50 percent of visit spent counseling patient regarding treatment plan. [name] KitchenVernetta Honey PA-C, have reviewed and agree with the above documentation in it's entirety.

## 2018-04-26 ENCOUNTER — Encounter: Payer: Self-pay | Admitting: Physician Assistant

## 2018-06-09 ENCOUNTER — Other Ambulatory Visit: Payer: Self-pay

## 2018-06-09 ENCOUNTER — Emergency Department (INDEPENDENT_AMBULATORY_CARE_PROVIDER_SITE_OTHER)
Admission: EM | Admit: 2018-06-09 | Discharge: 2018-06-09 | Disposition: A | Discharge status: Home / Self Care | Payer: 59 | Source: Home / Self Care | Attending: Family Medicine | Admitting: Family Medicine

## 2018-06-09 DIAGNOSIS — J101 Influenza due to other identified influenza virus with other respiratory manifestations: Secondary | ICD-10-CM | POA: Diagnosis not present

## 2018-06-09 LAB — POCT INFLUENZA A/B
Influenza A, POC: POSITIVE — AB
Influenza B, POC: NEGATIVE

## 2018-06-09 MED ORDER — BENZONATATE 200 MG PO CAPS: ORAL_CAPSULE | ORAL | 0 refills | Status: DC

## 2018-06-09 MED ORDER — OSELTAMIVIR PHOSPHATE 75 MG PO CAPS: 75.0000 mg | ORAL_CAPSULE | Freq: Two times a day (BID) | ORAL | 0 refills | Status: DC

## 2018-06-09 NOTE — ED Provider Notes (Signed)
[name] [facility]    CSN: [id number] Arrival date & time: 06/09/18  4970     History   Chief Complaint Chief Complaint  Patient presents with  . Cough  . Chills  . Nasal Congestion    HPI [name] is a 38 y.o. female.   Complains of 2 day history flu-like illness including myalgias, headache, fever 101/chills, fatigue, and cough.  Also has mild nasal congestion and sore throat.  Cough is non-productive and worse at night.  No pleuritic pain or shortness of breath.  She has had occasional wheezing although she does not have asthma, and has had to use an albuterol inhaler in the past during URI's.  The history is provided by the patient.    Past Medical History:  Diagnosis Date  . History of bipolar disorder   . History of pancreatitis   . Hx of varicella   . Vaginal Pap smear, abnormal     Patient Active Problem List   Diagnosis Date Noted  . Class 1 obesity due to excess calories without serious comorbidity with body mass index (BMI) of 32.0 to 32.9 in adult 04/20/2018  . 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 Nevus 03/15/2017  . 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 Encounter for insertion of intrauterine contraceptive device (IUD) 08/05/2016  . Normal pregnancy 06/03/2016  . Iron deficiency anemia 03/20/2016  . Borderline personality disorder ([place]) 07/09/2015  . Generalized anxiety disorder 07/09/2015  . Depression 07/09/2015  . Bipolar 1 disorder, mixed, moderate ([place]) 07/09/2015  . Left breast lump 03/05/2015  . Nipple discharge 03/05/2015    Past Surgical History:  Procedure Laterality Date  . CESAREAN SECTION N/A 06/04/2016   Procedure: CESAREAN SECTION;  Surgeon: Tyson Dense, MD;  Location: [facility];  Service: Obstetrics;  Laterality: N/A;  . MOUTH SURGERY      OB History    Gravida  1   Para  1   Term  1   Preterm      AB      Living  1     SAB      TAB      Ectopic      Multiple  0   Live Births  1              Home Medications    Prior to Admission medications   Medication Sig Start Date End Date Taking? Authorizing Provider  benzonatate (TESSALON) 200 MG capsule Take one cap by mouth at bedtime as needed for cough.  May repeat in 4 to 6 hours 06/09/18   Kandra Nicolas, MD  lamoTRIgine (LAMICTAL) 100 MG tablet Take 1 tablet (100 mg total) by mouth daily. 04/20/18   Donella Stade, PA-C  levonorgestrel (MIRENA, 52 MG,) 20 MCG/24HR IUD Inserted 08/04/2016 08/04/16   Emeterio Reeve, DO  Liraglutide -Weight Management (SAXENDA) 18 MG/3ML SOPN Inject 0.6 mg into the skin daily. For one week then increase by .6mg  weekly until reaches 3mg  daily.  Please include ultra fine needles 42mm 04/20/18   Breeback, Jade L, PA-C  oseltamivir (TAMIFLU) 75 MG capsule Take 1 capsule (75 mg total) by mouth every 12 (twelve) hours. 06/09/18   Kandra Nicolas, MD  rizatriptan (MAXALT) 10 MG tablet Take 1 tablet (10 mg total) by mouth as needed for migraine. May repeat in 2 hours if needed 09/30/17   Iran Planas L, PA-C  Topiramate ER (TROKENDI XR) 100 MG CP24 Take 1 tablet by mouth daily. 04/20/18  Breeback, Jade L, PA-C  Topiramate ER (TROKENDI XR) 50 MG CP24 Take 1 tablet by mouth daily. 10/19/17   Donella Stade, PA-C    Family History Family History  Problem Relation Age of Onset  . Stroke Father   . Cancer Maternal Aunt        stomach  . Cancer Maternal Grandmother        ovarian    Social History Social History   Tobacco Use  . Smoking status: Former Smoker    Packs/day: 0.50    Types: Cigarettes    Last attempt to quit: 09/16/2015    Years since quitting: 2.7  . Smokeless tobacco: Never Used  Substance Use Topics  . Alcohol use: Yes    Alcohol/week: 2.0 standard drinks    Types: 2 Standard drinks or equivalent per week  . Drug use: Not on file     Allergies   Codeine   Review of Systems Review of Systems + sore throat + cough No pleuritic pain + wheezing + nasal congestion +  post-nasal drainage No sinus pain/pressure No itchy/red eyes No earache No hemoptysis No SOB + fever, + chills No nausea No vomiting No abdominal pain No diarrhea No urinary symptoms No skin rash + fatigue N+myalgias + headache Used OTC meds without relief   Physical Exam Triage Vital Signs ED Triage Vitals [06/09/18 0830]  Enc Vitals Group     BP 110/75     Pulse Rate 100     Resp 18     Temp 98.4 F (36.9 C)     Temp Source Oral     SpO2 96 %     Weight 187 lb (84.8 kg)     Height 5\' 4"  (1.626 m)     Head Circumference      Peak Flow      Pain Score 0     Pain Loc      Pain Edu?      Excl. in [place]?    No data found.  Updated Vital Signs BP 110/75 (BP Location: Right Arm)   Pulse 100   Temp 98.4 F (36.9 C) (Oral)   Resp 18   Ht 5\' 4"  (1.626 m)   Wt 84.8 kg   SpO2 96%   BMI 32.10 kg/m   Visual Acuity Right Eye Distance:   Left Eye Distance:   Bilateral Distance:    Right Eye Near:   Left Eye Near:    Bilateral Near:     Physical Exam Nursing notes and Vital Signs reviewed. Appearance:  Patient appears stated age, and in no acute distress Eyes:  Pupils are equal, round, and reactive to light and accomodation.  Extraocular movement is intact.  Conjunctivae are not inflamed  Ears:  Canals normal.  Tympanic membranes normal.  Nose:  Congested turbinates.  No sinus tenderness.  Pharynx:  Normal Neck:  Supple.  Enlarged posterior/lateral nodes are palpated bilaterally, tender to palpation on the left.   Lungs:  Clear to auscultation.  Breath sounds are equal.  Moving air well. Heart:  Regular rate and rhythm without murmurs, rubs, or gallops.  Abdomen:  Nontender without masses or hepatosplenomegaly.  Bowel sounds are present.  No CVA or flank tenderness.  Extremities:  No edema.  Skin:  No rash present.    UC Treatments / Results  Labs (all labs ordered are listed, but only abnormal results are displayed) Labs Reviewed  POCT INFLUENZA A/B -  Abnormal; Notable for the  following components:      Result Value   Influenza A, POC Positive (*)    All other components within normal limits    EKG None  Radiology No results found.  Procedures Procedures (including critical care time)  Medications Ordered in UC Medications - No data to display  Initial Impression / Assessment and Plan / UC Course  I have reviewed the triage vital signs and the nursing notes.  Pertinent labs & imaging results that were available during my care of the patient were reviewed by me and considered in my medical decision making (see chart for details).    Begin Tamiflu. Prescription written for Benzonatate [facility]) to take at bedtime for night-time cough.  Followup with Family Doctor if not improved in one week.    Final Clinical Impressions(s) / UC Diagnoses   Final diagnoses:  Influenza A     Discharge Instructions     Take plain guaifenesin (1200mg  extended release tabs such as Mucinex) twice daily, with plenty of water, for cough and congestion.  May add Pseudoephedrine (30mg , one or two every 4 to 6 hours) for sinus congestion.  Get adequate rest.   May use Afrin nasal spray (or generic oxymetazoline) each morning for about 5 days and then discontinue.  Also recommend using saline nasal spray several times daily and saline nasal irrigation (AYR is a common brand).    Try warm salt water gargles for sore throat.  Stop all antihistamines for now, and other non-prescription cough/cold preparations. May take Tylenol for fever, headache, etc.       ED Prescriptions    Medication Sig Dispense Auth. Provider   oseltamivir (TAMIFLU) 75 MG capsule Take 1 capsule (75 mg total) by mouth every 12 (twelve) hours. 10 capsule Kandra Nicolas, MD   benzonatate (TESSALON) 200 MG capsule Take one cap by mouth at bedtime as needed for cough.  May repeat in 4 to 6 hours 15 capsule Assunta Found Ishmael Holter, MD         Kandra Nicolas, MD 06/09/18  [phone]

## 2018-06-09 NOTE — ED Triage Notes (Signed)
Pt c/o flu like sxs x 3 days. Congestion, cough, chills, fever, body aches. Fever last night of 101. Taking theraflu prn.

## 2018-06-09 NOTE — Discharge Instructions (Addendum)
Take plain guaifenesin (1200mg  extended release tabs such as Mucinex) twice daily, with plenty of water, for cough and congestion.  May add Pseudoephedrine (30mg , one or two every 4 to 6 hours) for sinus congestion.  Get adequate rest.   May use Afrin nasal spray (or generic oxymetazoline) each morning for about 5 days and then discontinue.  Also recommend using saline nasal spray several times daily and saline nasal irrigation (AYR is a common brand).    Try warm salt water gargles for sore throat.  Stop all antihistamines for now, and other non-prescription cough/cold preparations. May take Tylenol for fever, headache, etc.

## 2018-06-21 ENCOUNTER — Ambulatory Visit: Payer: 59 | Admitting: Physician Assistant

## 2018-06-21 ENCOUNTER — Encounter: Payer: Self-pay | Admitting: Physician Assistant

## 2018-06-21 VITALS — BP 117/68 | HR 80 | Ht 64.0 in | Wt 188.0 lb

## 2018-06-21 DIAGNOSIS — Z79899 Other long term (current) drug therapy: Secondary | ICD-10-CM | POA: Diagnosis not present

## 2018-06-21 DIAGNOSIS — E669 Obesity, unspecified: Secondary | ICD-10-CM

## 2018-06-21 NOTE — Patient Instructions (Addendum)
Slim roast coffee/cocca Spark energy drink  Obesity, Adult Obesity is having too much body fat. If you have a BMI of 30 or more, you are obese. BMI is a number that explains how much body fat you have. Obesity is often caused by taking in (consuming) more calories than your body uses. Obesity can cause serious health problems. Changing your lifestyle can help to treat obesity. Follow these instructions at home: Eating and drinking   Follow advice from your doctor about what to eat and drink. Your doctor may tell you to: ? Cut down on (limit) fast foods, sweets, and processed snack foods. ? Choose low-fat options. For example, choose low-fat milk instead of whole milk. ? Eat 5 or more servings of fruits or vegetables every day. ? Eat at home more often. This gives you more control over what you eat. ? Choose healthy foods when you eat out. ? Learn what a healthy portion size is. A portion size is the amount of a certain food that is healthy for you to eat at one time. This is different for each person. ? Keep low-fat snacks available. ? Avoid sugary drinks. These include soda, fruit juice, iced tea that is sweetened with sugar, and flavored milk. ? Eat a healthy breakfast.  Drink enough water to keep your pee (urine) clear or pale yellow.  Do not go without eating for long periods of time (do not fast).  Do not go on popular or trendy diets (fad diets). Physical Activity  Exercise often, as told by your doctor. Ask your doctor: ? What types of exercise are safe for you. ? How often you should exercise.  Warm up and stretch before being active.  Do slow stretching after being active (cool down).  Rest between times of being active. Lifestyle  Limit how much time you spend in front of your TV, computer, or video game system (be less sedentary).  Find ways to reward yourself that do not involve food.  Limit alcohol intake to no more than 1 drink a day for nonpregnant women and  2 drinks a day for men. One drink equals 12 oz of beer, 5 oz of wine, or 1 oz of hard liquor. General instructions  Keep a weight loss journal. This can help you keep track of: ? The food that you eat. ? The exercise that you do.  Take over-the-counter and prescription medicines only as told by your doctor.  Take vitamins and supplements only as told by your doctor.  Think about joining a support group. Your doctor may be able to help with this.  Keep all follow-up visits as told by your doctor. This is important. Contact a doctor if:  You cannot meet your weight loss goal after you have changed your diet and lifestyle for 6 weeks. This information is not intended to replace advice given to you by your health care provider. Make sure you discuss any questions you have with your health care provider. Document Released: 07/27/2011 Document Revised: 10/10/2015 Document Reviewed: 02/20/2015 Elsevier Interactive Patient Education  2019 [employer].

## 2018-06-21 NOTE — Progress Notes (Signed)
   Subjective:    Patient ID: [name], female    DOB: January 18, 1981, 38 y.o.   MRN: [id number]  HPI Pt is a 38 yo female who presents to the clinic to follow up on weight. She was given saxenda to start but she did not start it due to cost. She is trying to be better with diet. She is not exercising.   .. Active Ambulatory Problems    Diagnosis Date Noted  . Left breast lump 03/05/2015  . Nipple discharge 03/05/2015  . Borderline personality disorder ([place]) 07/09/2015  . Generalized anxiety disorder 07/09/2015  . Depression 07/09/2015  . Bipolar 1 disorder, mixed, moderate ([place]) 07/09/2015  . Iron deficiency anemia 03/20/2016  . Normal pregnancy 06/03/2016  . Encounter for insertion of intrauterine contraceptive device (IUD) 08/05/2016  . 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 Nevus 03/15/2017  . 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 Class 1 obesity due to excess calories without serious comorbidity with body mass index (BMI) of 32.0 to 32.9 in adult 04/20/2018   Resolved Ambulatory Problems    Diagnosis Date Noted  . [redacted] weeks gestation of pregnancy 03/20/2016   Past Medical History:  Diagnosis Date  . History of bipolar disorder   . History of pancreatitis   . Hx of varicella   . Vaginal Pap smear, abnormal       Review of Systems  All other systems reviewed and are negative.      Objective:   Physical Exam Vitals signs reviewed.  Constitutional:      Appearance: Normal appearance.  Cardiovascular:     Rate and Rhythm: Normal rate and regular rhythm.  Pulmonary:     Effort: Pulmonary effort is normal.     Breath sounds: Normal breath sounds.  Neurological:     General: No focal deficit present.     Mental Status: She is alert and oriented to person, place, and time.  Psychiatric:        Mood and Affect: Mood normal.        Behavior: Behavior normal.           Assessment & Plan:  [name] KitchenMarland KitchenKoralee was seen today for follow-up.  Diagnoses and all orders for this  visit:  Obesity (BMI 30-39.9) -     TSH  Medication management -     COMPLETE METABOLIC PANEL WITH GFR   .[name] KitchenDiscussed low carb diet with 1500 calories and 80g of protein.  Exercising at least 150 minutes a week.  [employer] could be a Microbiologist.  On trokendi.  Consider slim roast coffee/spark energy drinks.  Consider Physical Trainer.  Let me know if insurance changes.

## 2018-06-22 LAB — COMPLETE METABOLIC PANEL WITH GFR
AG Ratio: 2 (calc) (ref 1.0–2.5)
ALT: 25 U/L (ref 6–29)
AST: 20 U/L (ref 10–30)
Albumin: 4.4 g/dL (ref 3.6–5.1)
Alkaline phosphatase (APISO): 66 U/L (ref 31–125)
BUN: 13 mg/dL (ref 7–25)
CO2: 25 mmol/L (ref 20–32)
Calcium: 9.3 mg/dL (ref 8.6–10.2)
Chloride: 105 mmol/L (ref 98–110)
Creat: 0.74 mg/dL (ref 0.50–1.10)
GFR, Est African American: 119 mL/min/{1.73_m2} (ref >=60)
GFR, Est Non African American: 103 mL/min/{1.73_m2} (ref >=60)
Globulin: 2.2 g/dL (calc) (ref 1.9–3.7)
Glucose, Bld: 82 mg/dL (ref 65–99)
Potassium: 4.3 mmol/L (ref 3.5–5.3)
Sodium: 140 mmol/L (ref 135–146)
Total Bilirubin: 0.4 mg/dL (ref 0.2–1.2)
Total Protein: 6.6 g/dL (ref 6.1–8.1)

## 2018-06-22 LAB — TSH: TSH: 1.12 mIU/L

## 2018-06-22 NOTE — Progress Notes (Signed)
Call pt: labs perfect.

## 2018-09-09 ENCOUNTER — Encounter: Payer: Self-pay | Admitting: Physician Assistant

## 2018-09-12 ENCOUNTER — Encounter: Payer: Self-pay | Admitting: Physician Assistant

## 2018-09-12 ENCOUNTER — Ambulatory Visit (INDEPENDENT_AMBULATORY_CARE_PROVIDER_SITE_OTHER): Payer: 59 | Admitting: Physician Assistant

## 2018-09-12 VITALS — Temp 98.7°F | Ht 64.0 in | Wt 167.0 lb

## 2018-09-12 DIAGNOSIS — G43109 Migraine with aura, not intractable, without status migrainosus: Secondary | ICD-10-CM

## 2018-09-12 DIAGNOSIS — E663 Overweight: Secondary | ICD-10-CM | POA: Diagnosis not present

## 2018-09-12 MED ORDER — TOPIRAMATE ER 200 MG PO CAP24: 1.0000 | ORAL_CAPSULE | Freq: Every day | ORAL | 1 refills | Status: DC

## 2018-09-12 MED ORDER — ELETRIPTAN HYDROBROMIDE 20 MG PO TABS: 20.0000 mg | ORAL_TABLET | ORAL | 1 refills | Status: DC | PRN

## 2018-09-12 NOTE — Telephone Encounter (Signed)
Scheduled appointment

## 2018-09-12 NOTE — Progress Notes (Signed)
Patient states the last couple weeks migraines have increased to 3-4 a week. Rizatriptan doesn't work for emergency relief and is expensive. Wants to discuss increasing dose of Trokendi.

## 2018-09-12 NOTE — Progress Notes (Signed)
Patient ID: [name], female   DOB: 14-Jun-1980, 38 y.o.   MRN: [id number] .[name] KitchenVirtual Visit via Video Note  I connected with [name] on 09/13/18 at  8:50 AM EDT by a video enabled telemedicine application and verified that I am speaking with the correct person using two identifiers.   I discussed the limitations of evaluation and management by telemedicine and the availability of in person appointments. The patient expressed understanding and agreed to proceed.  History of Present Illness: Pt is a 38 yo female with Bipolar, GAD, migraines who calls into the clinic to discuss migraines worsening. Pt is currently on trokendi 100mg  daily. The medication worked great for many months but over the past few months she has been having more and more migraines. She reports 10-11 migraine days a month. They all start in afternoon. She takes maxalt but not seeming to help anymore. She then takes Aleve and goes to bed. She has been exercising and getting a lot healthier with her diet. She has cut out a lot of sugar. She is down 23lbs. She denies drinking any diet sodas or meal replacement shakes. Migraines are her typical migraine behind her left eye more than right. She does get nauseated and sensitive to light.   .. Active Ambulatory Problems    Diagnosis Date Noted  . Left breast lump 03/05/2015  . Nipple discharge 03/05/2015  . Borderline personality disorder ([place]) 07/09/2015  . Generalized anxiety disorder 07/09/2015  . Depression 07/09/2015  . Bipolar 1 disorder, mixed, moderate ([place]) 07/09/2015  . Iron deficiency anemia 03/20/2016  . Encounter for insertion of intrauterine contraceptive device (IUD) 08/05/2016  . 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 Nevus 03/15/2017  . Ocular migraine 10/04/2017   Resolved Ambulatory Problems    Diagnosis Date Noted  . [redacted] weeks gestation of pregnancy 03/20/2016  . Normal pregnancy 06/03/2016  . Class 1 obesity due to excess calories without serious  comorbidity with body mass index (BMI) of 32.0 to 32.9 in adult 04/20/2018   Past Medical History:  Diagnosis Date  . History of bipolar disorder   . History of pancreatitis   . Hx of varicella   . Vaginal Pap smear, abnormal    Reviewed med, allergy, problem list.    Observations/Objective: No acute distress.  No labored breathing.  Normal mood.  Normal appearance.   .. Today's Vitals   09/12/18 0845  Temp: 98.7 F (37.1 C)  TempSrc: Oral  Weight: 167 lb (75.8 kg)  Height: 5\' 4"  (1.626 m)   Body mass index is 28.67 kg/m.    Assessment and Plan: [name] KitchenMarland KitchenOlla was seen today for migraine.  Diagnoses and all orders for this visit:  Ocular migraine -     Topiramate ER (TROKENDI XR) 200 MG CP24; Take 1 tablet by mouth daily. -     eletriptan (RELPAX) 20 MG tablet; Take 1 tablet (20 mg total) by mouth as needed for migraine or headache. May repeat in 2 hours if headache persists or recurs.  Overweight (BMI 25.0-29.9)   Increased trokendi to 200mg  daily. Will try another rescue. Sent relpax. Follow up in 4 weeks. If no improvement in migraine days then will consider emgality or aimovig.   Keep losing weight! Doing a great job.   Follow Up Instructions:    I discussed the assessment and treatment plan with the patient. The patient was provided an opportunity to ask questions and all were answered. The patient agreed with the plan and demonstrated an understanding  of the instructions.   The patient was advised to call back or seek an in-person evaluation if the symptoms worsen or if the condition fails to improve as anticipated.  I provided 25 minutes of non-face-to-face time during this encounter.   Iran Planas, PA-C

## 2018-12-20 ENCOUNTER — Ambulatory Visit: Payer: 59 | Admitting: Physician Assistant

## 2019-03-16 ENCOUNTER — Other Ambulatory Visit: Payer: Self-pay | Admitting: Physician Assistant

## 2019-03-16 DIAGNOSIS — G43109 Migraine with aura, not intractable, without status migrainosus: Secondary | ICD-10-CM

## 2019-05-08 ENCOUNTER — Telehealth (INDEPENDENT_AMBULATORY_CARE_PROVIDER_SITE_OTHER): Payer: No Typology Code available for payment source | Admitting: Family Medicine

## 2019-05-08 ENCOUNTER — Encounter: Payer: Self-pay | Admitting: Family Medicine

## 2019-05-08 VITALS — Temp 98.4°F

## 2019-05-08 DIAGNOSIS — R05 Cough: Secondary | ICD-10-CM | POA: Diagnosis not present

## 2019-05-08 DIAGNOSIS — Z20822 Contact with and (suspected) exposure to covid-19: Secondary | ICD-10-CM

## 2019-05-08 DIAGNOSIS — Z20828 Contact with and (suspected) exposure to other viral communicable diseases: Secondary | ICD-10-CM | POA: Diagnosis not present

## 2019-05-08 DIAGNOSIS — J029 Acute pharyngitis, unspecified: Secondary | ICD-10-CM | POA: Diagnosis not present

## 2019-05-08 DIAGNOSIS — R509 Fever, unspecified: Secondary | ICD-10-CM

## 2019-05-08 DIAGNOSIS — R059 Cough, unspecified: Secondary | ICD-10-CM

## 2019-05-08 NOTE — Progress Notes (Signed)
Reports that her sxs began Thursday and became worse by Friday.  Taking advil for sore throat, Tussin for cough.  She works at [employer] that has had 5 Positive cases. She went to [facility] and her rapid was negative was told to f/u with PCP to have a repeat COVID test done.Elouise Munroe, [place]

## 2019-05-08 NOTE — Progress Notes (Signed)
Virtual Visit via Video Note  I connected with [name] on 05/08/19 at 10:10 AM EST by a video enabled telemedicine application and verified that I am speaking with the correct person using two identifiers.   I discussed the limitations of evaluation and management by telemedicine and the availability of in person appointments. The patient expressed understanding and agreed to proceed.  Subjective:    CC: Throat and cough.  HPI: Reports that her sxs began Thursday and became worse by Friday. Taking advil for sore throat, Tussin for cough. Mostly dry cough cough but occ phlegm. Had a fever on Sat, 101.1.    She works at [employer] that has had 5 Positive cases. She went to [facility] and her rapid was negative yesterdaywas told to f/u with PCP to have a repeat COVID test done. Throat looks really red.  No white spots.  The provider that evaluated her really felt like she probably had Covid and recommended that she get a PCR test.  He did pull her daughter out of daycare just to be on the safe side to her daughter is currently asymptomatic.   Past medical history, Surgical history, Family history not pertinant except as noted below, Social history, Allergies, and medications have been entered into the medical record, reviewed, and corrections made.   Review of Systems: No fevers, chills, night sweats, weight loss, chest pain, or shortness of breath.   Objective:    General: Speaking clearly in complete sentences without any shortness of breath.  Alert and oriented x3.  Normal judgment. No apparent acute distress.    Impression and Recommendations:   Sore throat/cough-suspect Covid-we will come by for PCR swab.  Call if worsening symptoms or new symptoms.  Fever seems to have resolved.  Continue symptomatic care.       I discussed the assessment and treatment plan with the patient. The patient was provided an opportunity to ask questions and all were answered. The patient agreed  with the plan and demonstrated an understanding of the instructions.   The patient was advised to call back or seek an in-person evaluation if the symptoms worsen or if the condition fails to improve as anticipated.   Beatrice Lecher, MD

## 2019-05-08 NOTE — Addendum Note (Signed)
Addended by: Towana Badger on: 05/08/2019 11:32 AM   Modules accepted: Orders

## 2019-05-10 ENCOUNTER — Encounter: Payer: Self-pay | Admitting: *Deleted

## 2019-05-10 ENCOUNTER — Encounter: Payer: Self-pay | Admitting: Family Medicine

## 2019-05-10 LAB — NOVEL CORONAVIRUS, NAA: SARS-CoV-2, NAA: NOT DETECTED

## 2019-09-29 ENCOUNTER — Encounter: Payer: Self-pay | Admitting: Physician Assistant

## 2019-10-11 ENCOUNTER — Encounter: Payer: Self-pay | Admitting: Physician Assistant

## 2019-10-11 ENCOUNTER — Telehealth (INDEPENDENT_AMBULATORY_CARE_PROVIDER_SITE_OTHER): Payer: 59 | Admitting: Physician Assistant

## 2019-10-11 VITALS — Temp 98.6°F | Ht 64.0 in | Wt 193.0 lb

## 2019-10-11 DIAGNOSIS — R29898 Other symptoms and signs involving the musculoskeletal system: Secondary | ICD-10-CM | POA: Diagnosis not present

## 2019-10-11 DIAGNOSIS — R519 Headache, unspecified: Secondary | ICD-10-CM | POA: Diagnosis not present

## 2019-10-11 DIAGNOSIS — R202 Paresthesia of skin: Secondary | ICD-10-CM | POA: Diagnosis not present

## 2019-10-11 DIAGNOSIS — R29818 Other symptoms and signs involving the nervous system: Secondary | ICD-10-CM | POA: Diagnosis not present

## 2019-10-11 DIAGNOSIS — Z823 Family history of stroke: Secondary | ICD-10-CM

## 2019-10-11 NOTE — Progress Notes (Signed)
Saturday night: Numbness in both arms, head felt heavy - a few minutes  - an hour later had sharp pains in head that lasted for 5 minutes Sunday/Monday: the same thing happened with numbness in pinky/forearm on right side - no head pain

## 2019-10-11 NOTE — Progress Notes (Signed)
Patient ID: [name], female   DOB: 02-Aug-1980, 39 y.o.   MRN: [id number] .[name] KitchenVirtual Visit via Video Note  I connected with [name] on 10/11/2019 at  3:00 PM EDT by a video enabled telemedicine application and verified that I am speaking with the correct person using two identifiers.  Location: Patient: home Provider: clinic   I discussed the limitations of evaluation and management by telemedicine and the availability of in person appointments. The patient expressed understanding and agreed to proceed.  History of Present Illness: Pt is a 39 yo female who calls into the clinic with episodes of sharp head pains and paresthesias. Pt does have hx of migraines but has not been having problems with them. She is off topamax and has been for many months. She has had episodes on Saturday/sunday and Monday. Saturday night was the first episode with numbness in both arms and felt really heavy for a few minutes but then later she had sharp pain in head that lasted for 5 minutes. Sunday and Monday same thing happened with numbness in more on right side. No head pains on those dayShe did get pfizer covid vaccine on 17th of may. No med changes. No speech changes, facial asymmetry, no vision changes.   Pt is concerned because father had stroke young.   .. Active Ambulatory Problems    Diagnosis Date Noted  . Left breast lump 03/05/2015  . Nipple discharge 03/05/2015  . Borderline personality disorder ([place]) 07/09/2015  . Generalized anxiety disorder 07/09/2015  . Depression 07/09/2015  . Bipolar 1 disorder, mixed, moderate ([place]) 07/09/2015  . Iron deficiency anemia 03/20/2016  . Encounter for insertion of intrauterine contraceptive device (IUD) 08/05/2016  . 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 Nevus 03/15/2017  . 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 Worsening headaches 10/13/2019  . Paresthesia 10/13/2019   Resolved Ambulatory Problems    Diagnosis Date Noted  . [redacted] weeks gestation of pregnancy  03/20/2016  . Normal pregnancy 06/03/2016  . Class 1 obesity due to excess calories without serious comorbidity with body mass index (BMI) of 32.0 to 32.9 in adult 04/20/2018   Past Medical History:  Diagnosis Date  . History of bipolar disorder   . History of pancreatitis   . Hx of varicella   . Vaginal Pap smear, abnormal    Reviewed med, allergy, problem list.    Observations/Objective: No acute distress Normal mood and appearance.  No facial asymmetry. No speech issues.  Per patient no upper or lower extremity weakness.     Assessment and Plan: [name] KitchenMarland KitchenEvangelia was seen today for numbness.  Diagnoses and all orders for this visit:  Paresthesia  Worsening headaches   Need MRI of brain. Pt called back after appt and had another event. Will place STAT order. Will also check carotids with ultrasound. Follow up or go to ED with new or worsening symptoms that do not resolve in minutes. Discussed and went over stroke symptoms.     Follow Up Instructions:    I discussed the assessment and treatment plan with the patient. The patient was provided an opportunity to ask questions and all were answered. The patient agreed with the plan and demonstrated an understanding of the instructions.   The patient was advised to call back or seek an in-person evaluation if the symptoms worsen or if the condition fails to improve as anticipated.  I provided 25 minutes of non-face-to-face time during this encounter.   Iran Planas, PA-C

## 2019-10-13 ENCOUNTER — Encounter: Payer: Self-pay | Admitting: Physician Assistant

## 2019-10-13 ENCOUNTER — Other Ambulatory Visit: Payer: Self-pay

## 2019-10-13 ENCOUNTER — Ambulatory Visit (HOSPITAL_COMMUNITY)
Admission: RE | Admit: 2019-10-13 | Discharge: 2019-10-13 | Disposition: A | Discharge status: Home / Self Care | Payer: No Typology Code available for payment source | Source: Ambulatory Visit | Attending: Physician Assistant | Admitting: Physician Assistant

## 2019-10-13 DIAGNOSIS — R519 Headache, unspecified: Secondary | ICD-10-CM | POA: Insufficient documentation

## 2019-10-13 DIAGNOSIS — R202 Paresthesia of skin: Secondary | ICD-10-CM | POA: Insufficient documentation

## 2019-10-13 DIAGNOSIS — R29818 Other symptoms and signs involving the nervous system: Secondary | ICD-10-CM | POA: Diagnosis not present

## 2019-10-13 DIAGNOSIS — G4440 Drug-induced headache, not elsewhere classified, not intractable: Secondary | ICD-10-CM | POA: Insufficient documentation

## 2019-10-13 DIAGNOSIS — R29898 Other symptoms and signs involving the musculoskeletal system: Secondary | ICD-10-CM | POA: Insufficient documentation

## 2019-10-13 MED ORDER — GADOBUTROL 1 MMOL/ML IV SOLN
8.7000 mL | Freq: Once | INTRAVENOUS | Status: AC | PRN
  Administered 2019-10-13: 8.7 mL via INTRAVENOUS

## 2019-10-13 NOTE — Telephone Encounter (Signed)
Patient also called.  Wants to know if she should be doing anything additional and asked about an MRI? I don't see one ordered. Please advise.

## 2019-10-17 ENCOUNTER — Other Ambulatory Visit: Payer: Self-pay | Admitting: Physician Assistant

## 2019-10-17 DIAGNOSIS — R29818 Other symptoms and signs involving the nervous system: Secondary | ICD-10-CM

## 2019-10-17 DIAGNOSIS — R29898 Other symptoms and signs involving the musculoskeletal system: Secondary | ICD-10-CM

## 2019-10-17 DIAGNOSIS — R202 Paresthesia of skin: Secondary | ICD-10-CM

## 2019-10-17 DIAGNOSIS — G43109 Migraine with aura, not intractable, without status migrainosus: Secondary | ICD-10-CM

## 2019-10-17 NOTE — Progress Notes (Signed)
Called patient and discuss results.   MRI shows  Small solitary hyperintensity right frontal subcortical white matter which could be due to chronic ischemia or migraine headache. Not typical for demyelinating disease.  Pt is only having 2-3 typical migraines a month but having the intermittent episodes of extremity tingling and weakness daily if not multiple times a day. Told patient to start ASA daily. Referral made to neurology. Hold on starting any migraine medication at this time.

## 2019-10-20 ENCOUNTER — Encounter: Payer: Self-pay | Admitting: Physician Assistant

## 2019-10-20 NOTE — Telephone Encounter (Signed)
Thank you. Please make a note if we cannot get her schedule soon may strongly encourage ED visit.

## 2019-10-20 NOTE — Telephone Encounter (Signed)
I called [facility] and left a message for the referral department to see if we can get patient scheduled ASAP.  Waiting on return call back. - CF

## 2019-10-20 NOTE — Telephone Encounter (Signed)
Can we please see if neurology will get her in ASAP. She has had MRI.

## 2019-10-23 NOTE — Telephone Encounter (Signed)
[facility] called me back and I have patient scheduled tomorrow with a check in of 3:30. Patient is aware of the appointment and has the address - CF

## 2019-10-23 NOTE — Telephone Encounter (Signed)
Thank you :)

## 2019-10-24 ENCOUNTER — Encounter: Payer: Self-pay | Admitting: Diagnostic Neuroimaging

## 2019-10-24 ENCOUNTER — Ambulatory Visit: Payer: No Typology Code available for payment source

## 2019-10-24 ENCOUNTER — Other Ambulatory Visit: Payer: Self-pay

## 2019-10-24 ENCOUNTER — Ambulatory Visit: Payer: No Typology Code available for payment source | Admitting: Diagnostic Neuroimaging

## 2019-10-24 VITALS — BP 111/73 | HR 91 | Ht 64.0 in | Wt 190.8 lb

## 2019-10-24 DIAGNOSIS — G43109 Migraine with aura, not intractable, without status migrainosus: Secondary | ICD-10-CM | POA: Diagnosis not present

## 2019-10-24 MED ORDER — AJOVY 225 MG/1.5ML ~~LOC~~ SOAJ: 225.0000 mg | SUBCUTANEOUS | 4 refills | Status: DC

## 2019-10-24 MED ORDER — ELETRIPTAN HYDROBROMIDE 20 MG PO TABS: 20.0000 mg | ORAL_TABLET | ORAL | 0 refills | Status: DC | PRN

## 2019-10-24 MED ORDER — NURTEC 75 MG PO TBDP: 75.0000 mg | ORAL_TABLET | Freq: Every day | ORAL | 6 refills | Status: DC | PRN

## 2019-10-24 NOTE — Patient Instructions (Signed)
MIGRAINE TREATMENT PLAN:  MIGRAINE PREVENTION  LIFESTYLE CHANGES -Stop or avoid smoking -Decrease or avoid caffeine / alcohol -Eat and sleep on a regular schedule -Exercise several times per week - fremanezumab (Ajovy) 225mg  monthly  MIGRAINE RESCUE  - ibuprofen, tylenol, naproxen, diclofenac - eletriptan (Relpax) 40mg  as needed for breakthrough headache; may repeat x 1 after 2 hours; max 2 tabs per day or 8 per month - rimegepant (Nurtec) 75mg  as needed for breakthrough headache; max 8 per month

## 2019-10-24 NOTE — Progress Notes (Signed)
Normal carotid arteries. Great news. Lowers your risk for any ischemic stroke.

## 2019-10-24 NOTE — Progress Notes (Signed)
[facility]  PATIENT: [name] DOB: 1981/04/06  REFERRING CLINICIAN: Donella Stade, PA-C HISTORY FROM: patient  REASON FOR VISIT: new consult    HISTORICAL  CHIEF COMPLAINT:  Chief Complaint  Patient presents with  . Migraine    rm 7 New Pt  "migraines for a few years, was on Trokendi but it quit working; not many migraines lately but intermittent sharp pains; both arms go completely numb- can't move them x 10-15 minutes, has happened 3 times, no history of injury"  . Paresthesia    HISTORY OF PRESENT ILLNESS:   39 year old female here for evaluation of headaches.  Around age 72 years old patient had onset of right greater than left-sided piercing severe blinding pain associate with nausea, photophobia and phonophobia.  Sometimes she would see spots and sparkles and swelling movements.  Patient was diagnosed with migraine with aura.  She is tried Trokendi, Maxalt and over-the-counter medications recently without relief.  Medications helped initially but not anymore.  Now patient having headaches about 2-3 times per week.  In the past several weeks she is also had several episodes of bilateral upper extremity numbness weakness, rating to her neck and head.  She had MRI of the brain which was unremarkable.  Symptoms last about 10 to 15 minutes at a time.  1 time this was followed by a sharp headache on the right side.   REVIEW OF SYSTEMS: Full 14 system review of systems performed and negative with exception of: As per HPI.  ALLERGIES: Allergies  Allergen Reactions  . Codeine Swelling    HOME MEDICATIONS: Outpatient Medications Prior to Visit  Medication Sig Dispense Refill  . aspirin EC 81 MG tablet Take 81 mg by mouth daily.    . fluticasone (FLONASE) 50 MCG/ACT nasal spray Place into both nostrils daily.    [name] levonorgestrel (MIRENA, 52 MG,) 20 MCG/24HR IUD Inserted 08/04/2016 1 each 0   No facility-administered medications prior to visit.     PAST MEDICAL HISTORY: Past Medical History:  Diagnosis Date  . Headache   . History of bipolar disorder   . History of pancreatitis   . Hx of varicella   . Vaginal Pap smear, abnormal     PAST SURGICAL HISTORY: Past Surgical History:  Procedure Laterality Date  . CESAREAN SECTION N/A 06/04/2016   Procedure: CESAREAN SECTION;  Surgeon: Tyson Dense, MD;  Location: [facility];  Service: Obstetrics;  Laterality: N/A;  . MOUTH SURGERY     x 2, wisdom teeth, adjust teeth    FAMILY HISTORY: Family History  Problem Relation Age of Onset  . Stroke Father   . Heart attack Father   . Cancer Maternal Aunt        stomach  . Cancer Maternal Grandmother        ovarian  . Headache Mother   . Migraines Brother     SOCIAL HISTORY: Social History   Socioeconomic History  . Marital status: Single    Spouse name: Not on file  . Number of children: 1  . Years of education: Not on file  . Highest education level: High school graduate  Occupational History  . Not on file  Tobacco Use  . Smoking status: Former Smoker    Packs/day: 0.50    Types: Cigarettes    Quit date: 09/16/2015    Years since quitting: 4.1  . Smokeless tobacco: Never Used  Substance and Sexual Activity  . Alcohol use: Not Currently  Alcohol/week: 2.0 standard drinks    Types: 2 Standard drinks or equivalent per week  . Drug use: Never  . Sexual activity: Yes  Other Topics Concern  . Not on file  Social History Narrative   Lives with child   Caffeine- sodas 16 oz, 2 daily   Social Determinants of Health   Financial Resource Strain:   . Difficulty of Paying Living Expenses:   Food Insecurity:   . Worried About Charity fundraiser in the Last Year:   . Arboriculturist in the Last Year:   Transportation Needs:   . Film/video editor (Medical):   [name] Lack of Transportation (Non-Medical):   Physical Activity:   . Days of Exercise per Week:   . Minutes of Exercise per Session:    Stress:   . Feeling of Stress :   Social Connections:   . Frequency of Communication with Friends and Family:   . Frequency of Social Gatherings with Friends and Family:   . Attends Religious Services:   . Active Member of Clubs or Organizations:   . Attends Archivist Meetings:   [name] Marital Status:   Intimate Partner Violence:   . Fear of Current or Ex-Partner:   . Emotionally Abused:   [name] Physically Abused:   . Sexually Abused:      PHYSICAL EXAM  GENERAL EXAM/CONSTITUTIONAL: Vitals:  Vitals:   10/24/19 1531  BP: 111/73  Pulse: 91  Weight: 190 lb 12.8 oz (86.5 kg)  Height: 5\' 4"  (1.626 m)     Body mass index is 32.75 kg/m. Wt Readings from Last 3 Encounters:  10/24/19 190 lb 12.8 oz (86.5 kg)  10/11/19 193 lb (87.5 kg)  09/12/18 167 lb (75.8 kg)     Patient is in no distress; well developed, nourished and groomed; neck is supple  CARDIOVASCULAR:  Examination of carotid arteries is normal; no carotid bruits  Regular rate and rhythm, no murmurs  Examination of peripheral vascular system by observation and palpation is normal  EYES:  Ophthalmoscopic exam of optic discs and posterior segments is normal; no papilledema or hemorrhages  No exam data present  MUSCULOSKELETAL:  Gait, strength, tone, movements noted in Neurologic exam below  NEUROLOGIC: MENTAL STATUS:  No flowsheet data found.  awake, alert, oriented to person, place and time  recent and remote memory intact  normal attention and concentration  language fluent, comprehension intact, naming intact  fund of knowledge appropriate  CRANIAL NERVE:   2nd - no papilledema on fundoscopic exam  2nd, 3rd, 4th, 6th - pupils equal and reactive to light, visual fields full to confrontation, extraocular muscles intact, no nystagmus  5th - facial sensation symmetric  7th - facial strength symmetric  8th - hearing intact  9th - palate elevates symmetrically, uvula midline  11th  - shoulder shrug symmetric  12th - tongue protrusion midline  MOTOR:   normal bulk and tone, full strength in the BUE, BLE  SENSORY:   normal and symmetric to light touch, temperature, vibration  COORDINATION:   finger-nose-finger, fine finger movements normal  REFLEXES:   deep tendon reflexes present and symmetric  GAIT/STATION:   narrow based gait     DIAGNOSTIC DATA (LABS, IMAGING, TESTING) - I reviewed patient records, labs, notes, testing and imaging myself where available.  Lab Results  Component Value Date   WBC 8.2 03/19/2017   HGB 13.8 03/19/2017   HCT 40.3 03/19/2017   MCV 86.3 03/19/2017   PLT  447 (H) 03/19/2017      Component Value Date/Time   NA 140 06/21/2018 1348   K 4.3 06/21/2018 1348   CL 105 06/21/2018 1348   CO2 25 06/21/2018 1348   GLUCOSE 82 06/21/2018 1348   BUN 13 06/21/2018 1348   CREATININE 0.74 06/21/2018 1348   CALCIUM 9.3 06/21/2018 1348   PROT 6.6 06/21/2018 1348   ALBUMIN 2.4 (L) 06/08/2016 0840   AST 20 06/21/2018 1348   ALT 25 06/21/2018 1348   ALKPHOS 95 06/08/2016 0840   BILITOT 0.4 06/21/2018 1348   GFRNONAA 103 06/21/2018 1348   GFRAA 119 06/21/2018 1348   Lab Results  Component Value Date   CHOL 147 03/19/2017   HDL 42 (L) 03/19/2017   LDLCALC 83 03/19/2017   TRIG 128 03/19/2017   CHOLHDL 3.5 03/19/2017   Lab Results  Component Value Date   HGBA1C 5.1 03/19/2017   No results found for: VITAMINB12 Lab Results  Component Value Date   TSH 1.12 06/21/2018     10/13/19 MRI brain - No acute abnormality - Small solitary hyperintensity right frontal subcortical white matter which could be due to chronic ischemia or migraine headache. Not typical for demyelinating disease.   ASSESSMENT AND PLAN  39 y.o. year old female here wit:  Dx:  1. Migraine with aura and without status migrainosus, not intractable      PLAN:  MIGRAINE TREATMENT PLAN:  MIGRAINE PREVENTION  LIFESTYLE CHANGES -Stop or avoid  smoking -Decrease or avoid caffeine / alcohol -Eat and sleep on a regular schedule -Exercise several times per week - fremanezumab (Ajovy) 225mg  monthly  MIGRAINE RESCUE  - ibuprofen, tylenol, naproxen, diclofenac - eletriptan (Relpax) 40mg  as needed for breakthrough headache; may repeat x 1 after 2 hours; max 2 tabs per day or 8 per month - rimegepant (Nurtec) 75mg  as needed for breakthrough headache; max 8 per month  ARM NUMBNESS (mild, intermittent) - monitor; may consider EMG/NCS and MRI cervical spine   Meds ordered this encounter  Medications  . Fremanezumab-vfrm (AJOVY) 225 MG/1.5ML SOAJ    Sig: Inject 225 mg into the skin every 30 (thirty) days.    Dispense:  3 pen    Refill:  4  . eletriptan (RELPAX) 20 MG tablet    Sig: Take 1 tablet (20 mg total) by mouth as needed for migraine or headache. May repeat in 2 hours if headache persists or recurs.    Dispense:  10 tablet    Refill:  0  . Rimegepant Sulfate (NURTEC) 75 MG TBDP    Sig: Take 75 mg by mouth daily as needed.    Dispense:  8 tablet    Refill:  6    Return in about 6 months (around 04/24/2020) for with NP (Amy Lomax).    Penni Bombard, MD 0/08/8887, 1:69 PM Certified in Neurology, Neurophysiology and Neuroimaging  [facility] [address], [place], [place] [address] [phone]

## 2019-11-19 ENCOUNTER — Other Ambulatory Visit: Payer: Self-pay | Admitting: Diagnostic Neuroimaging

## 2019-12-19 ENCOUNTER — Encounter: Payer: Self-pay | Admitting: Physician Assistant

## 2019-12-19 ENCOUNTER — Ambulatory Visit (INDEPENDENT_AMBULATORY_CARE_PROVIDER_SITE_OTHER): Payer: No Typology Code available for payment source | Admitting: Physician Assistant

## 2019-12-19 ENCOUNTER — Other Ambulatory Visit: Payer: Self-pay

## 2019-12-19 VITALS — BP 118/83 | HR 89 | Temp 98.3°F | Ht 64.0 in | Wt 180.0 lb

## 2019-12-19 DIAGNOSIS — F331 Major depressive disorder, recurrent, moderate: Secondary | ICD-10-CM | POA: Diagnosis not present

## 2019-12-19 DIAGNOSIS — F603 Borderline personality disorder: Secondary | ICD-10-CM | POA: Diagnosis not present

## 2019-12-19 DIAGNOSIS — F3162 Bipolar disorder, current episode mixed, moderate: Secondary | ICD-10-CM

## 2019-12-19 DIAGNOSIS — F411 Generalized anxiety disorder: Secondary | ICD-10-CM | POA: Diagnosis not present

## 2019-12-19 MED ORDER — CLONAZEPAM 0.5 MG PO TABS: 0.5000 mg | ORAL_TABLET | Freq: Two times a day (BID) | ORAL | 0 refills | Status: DC | PRN

## 2019-12-19 MED ORDER — FLUOXETINE HCL 10 MG PO CAPS: ORAL_CAPSULE | ORAL | 1 refills | Status: DC

## 2019-12-19 NOTE — Patient Instructions (Signed)

## 2019-12-19 NOTE — Progress Notes (Signed)
Subjective:    Patient ID: [name], female    DOB: Dec 11, 1980, 39 y.o.   MRN: [id number]  HPI  Pt is a 39 yo female with history of bipolar 1, MDD, GAD who presents to the clinic with worsening mood changes due to finding out her fiance has been cheating on her for 2 years. She is very distraught. They have a 32 1/39 yo together. They were going to try to make it work but then she found out he was still lying to her. She feels "worthless". She knows it is not her fault but hates that her daughter is going to have to live like this. She is going to a friends house for a week in [place] to clear her head. She admits to "wondering why she is here" she has not made any plans and "would not do that to her daughter".  She has been off medication and doing fantastic for 2 years for mood. She feels like she "cannot do this on her own". She continues to work full time. She is committed to co-parenting. She doe have good friends, co-workers and family.   .. Active Ambulatory Problems    Diagnosis Date Noted  . Left breast lump 03/05/2015  . Nipple discharge 03/05/2015  . Borderline personality disorder ([place]) 07/09/2015  . Generalized anxiety disorder 07/09/2015  . Depression 07/09/2015  . Bipolar 1 disorder, mixed, moderate ([place]) 07/09/2015  . Iron deficiency anemia 03/20/2016  . Encounter for insertion of intrauterine contraceptive device (IUD) 08/05/2016  . 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 Nevus 03/15/2017  . 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 Worsening headaches 10/13/2019  . Paresthesia 10/13/2019  . Upper extremity weakness 10/13/2019  . Other symptoms and signs involving the nervous system 10/13/2019   Resolved Ambulatory Problems    Diagnosis Date Noted  . [redacted] weeks gestation of pregnancy 03/20/2016  . Normal pregnancy 06/03/2016  . Class 1 obesity due to excess calories without serious comorbidity with body mass index (BMI) of 32.0 to 32.9 in adult 04/20/2018   Past Medical  History:  Diagnosis Date  . Headache   . History of bipolar disorder   . History of pancreatitis   . Hx of varicella   . Migraines   . Vaginal Pap smear, abnormal      Review of Systems  All other systems reviewed and are negative.      Objective:   Physical Exam Vitals reviewed.  Constitutional:      Appearance: Normal appearance.  Cardiovascular:     Rate and Rhythm: Normal rate and regular rhythm.  Pulmonary:     Effort: Pulmonary effort is normal.  Neurological:     General: No focal deficit present.     Mental Status: She is alert and oriented to person, place, and time.  Psychiatric:        Judgment: Judgment normal.     Comments: tearful          .[name] Depression screen [facility] 2/9 12/19/2019 06/21/2018 04/20/2018 01/11/2018 09/30/2017  Decreased Interest 3 1 1 1 1   Down, Depressed, Hopeless 3 1 1 1 1   PHQ - 2 Score 6 2 2 2 2   Altered sleeping 1 1 2  0 2  Tired, decreased energy 3 1 2 1 2   Change in appetite 3 2 3 3 2   Feeling bad or failure about yourself  3 1 1 1 1   Trouble concentrating 3 1 1 2 1   Moving slowly or fidgety/restless 3 1  0 3 2  Suicidal thoughts 3 0 0 1 1  PHQ-9 Score 25 9 11 13 13   Difficult doing work/chores Very difficult Not difficult at all Not difficult at all Not difficult at all Somewhat difficult   .[name] GAD 7 : Generalized Anxiety Score 12/19/2019 06/21/2018 04/20/2018 01/11/2018  Nervous, Anxious, on Edge 3 2 2 3   Control/stop worrying 3 1 2 3   Worry too much - different things 3 2 2 3   Trouble relaxing 3 1 2 3   Restless 3 1 2 3   Easily annoyed or irritable 3 2 3 2   Afraid - awful might happen 3 1 3 3   Total GAD 7 Score 21 10 16 20   Anxiety Difficulty Very difficult Not difficult at all Somewhat difficult Somewhat difficult     Assessment & Plan:  [name] KitchenMarland KitchenMonroe was seen today for depression.  Diagnoses and all orders for this visit:  Bipolar 1 disorder, mixed, moderate (HCC) -     FLUoxetine (PROZAC) 10 MG capsule; Take one tablet for 2  weeks then increase to two tablets daily. -     Ambulatory referral to Psychology  Borderline personality disorder ([place]) -     FLUoxetine (PROZAC) 10 MG capsule; Take one tablet for 2 weeks then increase to two tablets daily. -     Ambulatory referral to Psychology  Moderate episode of recurrent major depressive disorder (HCC) -     FLUoxetine (PROZAC) 10 MG capsule; Take one tablet for 2 weeks then increase to two tablets daily. -     Ambulatory referral to Psychology  Generalized anxiety disorder -     FLUoxetine (PROZAC) 10 MG capsule; Take one tablet for 2 weeks then increase to two tablets daily. -     clonazePAM (KLONOPIN) 0.5 MG tablet; Take 1 tablet (0.5 mg total) by mouth 2 (two) times daily as needed for anxiety. -     Ambulatory referral to Psychology   PHQ and GAD numbers are very elevated as suspected.  Patient has underlying history of mental illness however she has been off medications for over 2 years and doing great.  I do think this has set her back up but I think we can get her back on track.  I do think she needs to get back in with counseling.  I made referral today.  She had previously been on Wellbutrin, Prozac, Lamictal and doing great.  1 is restart Prozac today and see if we can get her's more stable with just Prozac alone.  We could consider adding the other 2 as well.  I gave her a very small quantity of Klonopin to use as needed for those increase anxiety moments that feel like progressing into panic attacks.  Strongly encouraged regular walking and exercise.  Encourage patient not to isolate herself.  Discussed rewiring negative thoughts. I do not think right now she is at risk for harming herself or others.  I did discuss if these thoughts worsen or she makes a plan to go to the [employer] in [place].  She is aware of where this is.  Spent 35 minutes with patient.   Follow up in 6 weeks.

## 2019-12-29 ENCOUNTER — Encounter: Payer: Self-pay | Admitting: Physician Assistant

## 2019-12-29 DIAGNOSIS — F411 Generalized anxiety disorder: Secondary | ICD-10-CM

## 2019-12-29 NOTE — Telephone Encounter (Signed)
Routing to covering provider.  °

## 2020-01-01 MED ORDER — CLONAZEPAM 0.5 MG PO TABS: 0.5000 mg | ORAL_TABLET | Freq: Two times a day (BID) | ORAL | 0 refills | Status: DC | PRN

## 2020-01-01 NOTE — Telephone Encounter (Signed)
Patient also left a vm.   Written 12/19/2019 #10 with no refills.  Last appt 12/19/2019

## 2020-01-10 ENCOUNTER — Other Ambulatory Visit: Payer: Self-pay | Admitting: Physician Assistant

## 2020-01-10 DIAGNOSIS — F411 Generalized anxiety disorder: Secondary | ICD-10-CM

## 2020-01-10 DIAGNOSIS — F603 Borderline personality disorder: Secondary | ICD-10-CM

## 2020-01-10 DIAGNOSIS — F3162 Bipolar disorder, current episode mixed, moderate: Secondary | ICD-10-CM

## 2020-01-10 DIAGNOSIS — F331 Major depressive disorder, recurrent, moderate: Secondary | ICD-10-CM

## 2020-01-30 ENCOUNTER — Ambulatory Visit: Payer: No Typology Code available for payment source | Admitting: Physician Assistant

## 2020-01-31 ENCOUNTER — Ambulatory Visit (INDEPENDENT_AMBULATORY_CARE_PROVIDER_SITE_OTHER): Payer: No Typology Code available for payment source | Admitting: Physician Assistant

## 2020-01-31 ENCOUNTER — Encounter: Payer: Self-pay | Admitting: Physician Assistant

## 2020-01-31 VITALS — Ht 64.0 in | Wt 168.0 lb

## 2020-01-31 DIAGNOSIS — Z639 Problem related to primary support group, unspecified: Secondary | ICD-10-CM | POA: Insufficient documentation

## 2020-01-31 DIAGNOSIS — F331 Major depressive disorder, recurrent, moderate: Secondary | ICD-10-CM | POA: Diagnosis not present

## 2020-01-31 DIAGNOSIS — F603 Borderline personality disorder: Secondary | ICD-10-CM

## 2020-01-31 DIAGNOSIS — F3162 Bipolar disorder, current episode mixed, moderate: Secondary | ICD-10-CM

## 2020-01-31 DIAGNOSIS — F411 Generalized anxiety disorder: Secondary | ICD-10-CM

## 2020-01-31 MED ORDER — FLUOXETINE HCL 40 MG PO CAPS: 40.0000 mg | ORAL_CAPSULE | Freq: Every day | ORAL | 0 refills | Status: DC

## 2020-01-31 MED ORDER — BUPROPION HCL ER (XL) 150 MG PO TB24: 150.0000 mg | ORAL_TABLET | ORAL | 0 refills | Status: DC

## 2020-01-31 NOTE — Progress Notes (Signed)
Patient ID: [name], female   DOB: 11/24/1980, 39 y.o.   MRN: [id number] .[name] KitchenVirtual Visit via Telephone Note  I connected with [name] on 01/31/20 at  2:20 PM EDT by telephone and verified that I am speaking with the correct person using two identifiers.  Location: Patient: home Provider: clinic   I discussed the limitations, risks, security and privacy concerns of performing an evaluation and management service by telephone and the availability of in person appointments. I also discussed with the patient that there may be a patient responsible charge related to this service. The patient expressed understanding and agreed to proceed.   History of Present Illness: Pt is a 39 yo female who presents to the clinic to follow up on mood.  Patient has been diagnosed with bipolar 1 mixed, depression, anxiety who is currently going through some relationship problems and separation.  Her fianc was cheating on her with another woman multiple times.  She does know this relationship passed to him.  They do have a mutual 74-year-old daughter.  They are going to coparent.  They are currently still living together but plans to have him move out soon.  She did start Prozac 20 mg and over the past 4 weeks has had some significant improvement.  She has bowel movements where she is not crying and even enjoying herself.  She denies any more suicidal thoughts or homicidal idealizations.  She does admit to seeing some light at the end of the tunnel.  She does hope to have more good days than bad days.  She does wish for a medication increase today.  .. Active Ambulatory Problems    Diagnosis Date Noted  . Left breast lump 03/05/2015  . Borderline personality disorder ([place]) 07/09/2015  . Generalized anxiety disorder 07/09/2015  . Depression 07/09/2015  . Bipolar 1 disorder, mixed, moderate ([place]) 07/09/2015  . Iron deficiency anemia 03/20/2016  . Encounter for insertion of intrauterine contraceptive device  (IUD) 08/05/2016  . 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 Nevus 03/15/2017  . 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 Worsening headaches 10/13/2019  . Paresthesia 10/13/2019  . Upper extremity weakness 10/13/2019  . Other symptoms and signs involving the nervous system 10/13/2019  . Relationship problems 01/31/2020   Resolved Ambulatory Problems    Diagnosis Date Noted  . Nipple discharge 03/05/2015  . [redacted] weeks gestation of pregnancy 03/20/2016  . Normal pregnancy 06/03/2016  . Class 1 obesity due to excess calories without serious comorbidity with body mass index (BMI) of 32.0 to 32.9 in adult 04/20/2018   Past Medical History:  Diagnosis Date  . Headache   . History of bipolar disorder   . History of pancreatitis   . Hx of varicella   . Migraines   . Vaginal Pap smear, abnormal    Reviewed med, allergy, problem list.     Observations/Objective: No acute distress Normal mood.   .. Today's Vitals   01/31/20 1421  Weight: 168 lb (76.2 kg)  Height: 5\' 4"  (1.626 m)   Body mass index is 28.84 kg/m.   .. Depression screen [facility] 2/9 01/31/2020 12/19/2019 06/21/2018 04/20/2018 01/11/2018  Decreased Interest 1 3 1 1 1   Down, Depressed, Hopeless 3 3 1 1 1   PHQ - 2 Score 4 6 2 2 2   Altered sleeping 1 1 1 2  0  Tired, decreased energy 1 3 1 2 1   Change in appetite 1 3 2 3 3   Feeling bad or failure about yourself  3  3 1 1 1   Trouble concentrating 3 3 1 1 2   Moving slowly or fidgety/restless 3 3 1  0 3  Suicidal thoughts 0 3 0 0 1  PHQ-9 Score 16 25 9 11 13   Difficult doing work/chores Somewhat difficult Very difficult Not difficult at all Not difficult at all Not difficult at all  Some recent data might be hidden   .[name] GAD 7 : Generalized Anxiety Score 01/31/2020 12/19/2019 06/21/2018 04/20/2018  Nervous, Anxious, on Edge 3 3 2 2   Control/stop worrying 3 3 1 2   Worry too much - different things 3 3 2 2   Trouble relaxing 3 3 1 2   Restless 0 3 1 2   Easily annoyed or irritable 3 3 2 3   Afraid  - awful might happen 1 3 1 3   Total GAD 7 Score 16 21 10 16   Anxiety Difficulty Very difficult Very difficult Not difficult at all Somewhat difficult     Assessment and Plan: [name] KitchenMarland KitchenDiagnoses and all orders for this visit:  Bipolar 1 disorder, mixed, moderate (HCC) -     FLUoxetine (PROZAC) 40 MG capsule; Take 1 capsule (40 mg total) by mouth daily. -     buPROPion (WELLBUTRIN XL) 150 MG 24 hr tablet; Take 1 tablet (150 mg total) by mouth every morning.  Borderline personality disorder ([place]) -     FLUoxetine (PROZAC) 40 MG capsule; Take 1 capsule (40 mg total) by mouth daily. -     buPROPion (WELLBUTRIN XL) 150 MG 24 hr tablet; Take 1 tablet (150 mg total) by mouth every morning.  Generalized anxiety disorder -     FLUoxetine (PROZAC) 40 MG capsule; Take 1 capsule (40 mg total) by mouth daily. -     buPROPion (WELLBUTRIN XL) 150 MG 24 hr tablet; Take 1 tablet (150 mg total) by mouth every morning.  Moderate episode of recurrent major depressive disorder (HCC) -     FLUoxetine (PROZAC) 40 MG capsule; Take 1 capsule (40 mg total) by mouth daily. -     buPROPion (WELLBUTRIN XL) 150 MG 24 hr tablet; Take 1 tablet (150 mg total) by mouth every morning.  Relationship problems -     FLUoxetine (PROZAC) 40 MG capsule; Take 1 capsule (40 mg total) by mouth daily. -     buPROPion (WELLBUTRIN XL) 150 MG 24 hr tablet; Take 1 tablet (150 mg total) by mouth every morning.   PHQ-9 and GAD-7 are significantly better.  Discussed with patient her numbers were almost cut by 10 points.  Looking back through history there was a time where she was controlled on Prozac, Lamictal, Wellbutrin.  She does not want to get on all these medications again.  I do think it would be prudent to increase Prozac to 40 mg and add Wellbutrin.  She agrees with this plan.  Continue to work on IT trainer, breathing, meditation, self care.  Follow-up in 3 months.   Follow Up Instructions:    I discussed the  assessment and treatment plan with the patient. The patient was provided an opportunity to ask questions and all were answered. The patient agreed with the plan and demonstrated an understanding of the instructions.   The patient was advised to call back or seek an in-person evaluation if the symptoms worsen or if the condition fails to improve as anticipated.  I provided 15 minutes of non-face-to-face time during this encounter.   Iran Planas, PA-C

## 2020-02-14 ENCOUNTER — Encounter: Payer: Self-pay | Admitting: Physician Assistant

## 2020-02-14 DIAGNOSIS — F3162 Bipolar disorder, current episode mixed, moderate: Secondary | ICD-10-CM

## 2020-02-14 DIAGNOSIS — F411 Generalized anxiety disorder: Secondary | ICD-10-CM

## 2020-02-16 MED ORDER — LAMOTRIGINE 25 MG PO TABS: ORAL_TABLET | ORAL | 0 refills | Status: DC

## 2020-03-11 ENCOUNTER — Other Ambulatory Visit: Payer: Self-pay | Admitting: Physician Assistant

## 2020-03-11 DIAGNOSIS — F3162 Bipolar disorder, current episode mixed, moderate: Secondary | ICD-10-CM

## 2020-03-11 DIAGNOSIS — F411 Generalized anxiety disorder: Secondary | ICD-10-CM

## 2020-03-11 MED ORDER — LAMOTRIGINE 100 MG PO TABS: 100.0000 mg | ORAL_TABLET | Freq: Every day | ORAL | 1 refills | Status: DC

## 2020-03-26 ENCOUNTER — Ambulatory Visit (HOSPITAL_COMMUNITY)
Admission: EM | Admit: 2020-03-26 | Discharge: 2020-03-27 | Disposition: A | Discharge status: Home / Self Care | Payer: No Typology Code available for payment source | Attending: Psychiatry | Admitting: Psychiatry

## 2020-03-26 ENCOUNTER — Telehealth: Payer: Self-pay

## 2020-03-26 ENCOUNTER — Ambulatory Visit: Payer: No Typology Code available for payment source | Admitting: Physician Assistant

## 2020-03-26 DIAGNOSIS — F3162 Bipolar disorder, current episode mixed, moderate: Secondary | ICD-10-CM | POA: Diagnosis not present

## 2020-03-26 DIAGNOSIS — F603 Borderline personality disorder: Secondary | ICD-10-CM | POA: Diagnosis not present

## 2020-03-26 DIAGNOSIS — F4323 Adjustment disorder with mixed anxiety and depressed mood: Secondary | ICD-10-CM | POA: Diagnosis not present

## 2020-03-26 DIAGNOSIS — F319 Bipolar disorder, unspecified: Secondary | ICD-10-CM | POA: Diagnosis not present

## 2020-03-26 DIAGNOSIS — Z20822 Contact with and (suspected) exposure to covid-19: Secondary | ICD-10-CM | POA: Insufficient documentation

## 2020-03-26 DIAGNOSIS — Z87891 Personal history of nicotine dependence: Secondary | ICD-10-CM | POA: Insufficient documentation

## 2020-03-26 LAB — LIPID PANEL
Cholesterol: 173 mg/dL (ref 0–200)
HDL: 51 mg/dL (ref >40)
LDL Cholesterol: 95 mg/dL (ref 0–99)
Total CHOL/HDL Ratio: 3.4 RATIO
Triglycerides: 134 mg/dL (ref <150)
VLDL: 27 mg/dL (ref 0–40)

## 2020-03-26 LAB — POCT URINE DRUG SCREEN - MANUAL ENTRY (I-SCREEN)
POC Amphetamine UR: NOT DETECTED
POC Buprenorphine (BUP): NOT DETECTED
POC Cocaine UR: NOT DETECTED
POC Marijuana UR: NOT DETECTED
POC Methadone UR: NOT DETECTED
POC Methamphetamine UR: NOT DETECTED
POC Morphine: NOT DETECTED
POC Oxazepam (BZO): NOT DETECTED
POC Oxycodone UR: NOT DETECTED
POC Secobarbital (BAR): NOT DETECTED

## 2020-03-26 LAB — COMPREHENSIVE METABOLIC PANEL
ALT: 15 U/L (ref 0–44)
AST: 16 U/L (ref 15–41)
Albumin: 4.1 g/dL (ref 3.5–5.0)
Alkaline Phosphatase: 77 U/L (ref 38–126)
Anion gap: 11 (ref 5–15)
BUN: 12 mg/dL (ref 6–20)
CO2: 24 mmol/L (ref 22–32)
Calcium: 9.3 mg/dL (ref 8.9–10.3)
Chloride: 104 mmol/L (ref 98–111)
Creatinine, Ser: 0.76 mg/dL (ref 0.44–1.00)
GFR, Estimated: >60 mL/min (ref >60)
Glucose, Bld: 86 mg/dL (ref 70–99)
Potassium: 3.6 mmol/L (ref 3.5–5.1)
Sodium: 139 mmol/L (ref 135–145)
Total Bilirubin: 0.4 mg/dL (ref 0.3–1.2)
Total Protein: 6.7 g/dL (ref 6.5–8.1)

## 2020-03-26 LAB — CBC WITH DIFFERENTIAL/PLATELET
Abs Immature Granulocytes: 0.05 10*3/uL (ref 0.00–0.07)
Basophils Absolute: 0.1 10*3/uL (ref 0.0–0.1)
Basophils Relative: 1 %
Eosinophils Absolute: 0.1 10*3/uL (ref 0.0–0.5)
Eosinophils Relative: 1 %
HCT: 42.4 % (ref 36.0–46.0)
Hemoglobin: 14.1 g/dL (ref 12.0–15.0)
Immature Granulocytes: 0 %
Lymphocytes Relative: 14 %
Lymphs Abs: 1.9 10*3/uL (ref 0.7–4.0)
MCH: 30.5 pg (ref 26.0–34.0)
MCHC: 33.3 g/dL (ref 30.0–36.0)
MCV: 91.6 fL (ref 80.0–100.0)
Monocytes Absolute: 0.9 10*3/uL (ref 0.1–1.0)
Monocytes Relative: 6 %
Neutro Abs: 10.3 10*3/uL — ABNORMAL HIGH (ref 1.7–7.7)
Neutrophils Relative %: 78 %
Platelets: 437 10*3/uL — ABNORMAL HIGH (ref 150–400)
RBC: 4.63 MIL/uL (ref 3.87–5.11)
RDW: 12.6 % (ref 11.5–15.5)
WBC: 13.4 10*3/uL — ABNORMAL HIGH (ref 4.0–10.5)
nRBC: 0 % (ref 0.0–0.2)

## 2020-03-26 LAB — HEMOGLOBIN A1C
Hgb A1c MFr Bld: 5.1 % (ref 4.8–5.6)
Mean Plasma Glucose: 99.67 mg/dL

## 2020-03-26 LAB — TSH: TSH: 1.565 u[IU]/mL (ref 0.350–4.500)

## 2020-03-26 LAB — ETHANOL: Alcohol, Ethyl (B): <10 mg/dL (ref <10)

## 2020-03-26 LAB — POC SARS CORONAVIRUS 2 AG: SARS Coronavirus 2 Ag: NEGATIVE

## 2020-03-26 LAB — POC SARS CORONAVIRUS 2 AG -  ED: SARS Coronavirus 2 Ag: NEGATIVE

## 2020-03-26 LAB — RESPIRATORY PANEL BY RT PCR (FLU A&B, COVID)
Influenza A by PCR: NEGATIVE
Influenza B by PCR: NEGATIVE
SARS Coronavirus 2 by RT PCR: NEGATIVE

## 2020-03-26 MED ORDER — ACETAMINOPHEN 325 MG PO TABS: 650.0000 mg | ORAL_TABLET | Freq: Four times a day (QID) | ORAL | Status: DC | PRN

## 2020-03-26 MED ORDER — FLUOXETINE HCL 20 MG PO CAPS
40.0000 mg | ORAL_CAPSULE | Freq: Every day | ORAL | Status: DC
  Administered 2020-03-26: 40 mg via ORAL
  Filled 2020-03-26: qty 2

## 2020-03-26 MED ORDER — LAMOTRIGINE 100 MG PO TABS
100.0000 mg | ORAL_TABLET | Freq: Every day | ORAL | Status: DC
  Administered 2020-03-26: 100 mg via ORAL
  Filled 2020-03-26: qty 1

## 2020-03-26 MED ORDER — MAGNESIUM HYDROXIDE 400 MG/5ML PO SUSP: 30.0000 mL | Freq: Every day | ORAL | Status: DC | PRN

## 2020-03-26 MED ORDER — ALUM & MAG HYDROXIDE-SIMETH 200-200-20 MG/5ML PO SUSP: 30.0000 mL | ORAL | Status: DC | PRN

## 2020-03-26 MED ORDER — QUETIAPINE FUMARATE 50 MG PO TABS
50.0000 mg | ORAL_TABLET | Freq: Every day | ORAL | Status: DC
  Administered 2020-03-26: 50 mg via ORAL
  Filled 2020-03-26: qty 1

## 2020-03-26 NOTE — Telephone Encounter (Signed)
Agree with plan 

## 2020-03-26 NOTE — ED Notes (Signed)
Pt sleeping@this  time. Breathing even and unlabored. No c/o of pain or distress. Will continue to monitor pt for safety

## 2020-03-26 NOTE — ED Provider Notes (Signed)
Behavioral Health Admission [facility]&P [facility])  Date: 03/26/20 Patient Name: [name] MRN: [id number] Chief Complaint:  Chief Complaint  Patient presents with  . Suicidal      Diagnoses:  Final diagnoses:  Bipolar 1 disorder, mixed, moderate (HCC)  Borderline personality disorder ([place])  Adjustment disorder with mixed anxiety and depressed mood    HPI: Patient is a 39 year old female presented to the [facility] as a walk-in voluntarily reporting worsening depressive symptoms with suicidal ideations and a reported suicide attempt last night of attempting to hang herself.  Patient reports that she has a significant psychiatric history of bipolar 1 disorder, borderline personality disorder.  Patient reports that in June she was engaged and found out that her exfianc was cheating on her and was having a woman in her house.  She states that they are in the process of separation however he was unable to move out at the time because of having a child together and it was very difficult.  She states that in July she was able to have her position changed at work so that she can be off on weekends to have her daughter.  She reports that she follows family medicine provider and has her Prozac and Lamictal prescribed to her.  She states that the medications were helping some but this is been too overwhelming for her.  She reports that last Friday she attempted to hang herself and that last night she attempted to hang herself again.  She states that not having her daughter with her was a significant trigger for her.  She states that she does not want to die and wants to be there for her daughter.  She states that she has not been to therapy in quite some time and that she has not seen psychiatrist in several years.  She reports that she had a previous suicide attempt in 2005 and was hospitalized then.  She denies any illicit drug use or alcohol, except for occasional drink of alcohol which included 1 glass of wine  yesterday.  She reports that she is employed at [employer] full-time.  She reports that her appetite has been decreased and has lost approximately 30 pounds in 3 months and her sleep is been very poor with an average of 4 to 5 hours at night but it has been broken.  She reports that her provider has her on Prozac 40 mg p.o. daily and Wellbutrin XL 150 mg p.o. daily however she was not seeing significant improvement and her doctor discontinued her Wellbutrin XL and started her on Lamictal.  She states that she has been on approximately 5 weeks and is increased to 200 mg a day.  Patient does not recall any antipsychotic medications that she has been prescribed even with her having the diagnosis of bipolar 1 disorder.  After discussing medications with the patient she is agreed to continue her Lamictal and Prozac and will start Seroquel 50 mg p.o. nightly to assist with sleep and mood stability.  Patient will be admitted to the continuous observation unit and reassess tomorrow.  PHQ 2-9:    Office Visit from 01/31/2020 in [place] Office Visit from 12/19/2019 in [place] Office Visit from 06/21/2018 in [place]  Thoughts that you would be better off dead, or of hurting yourself in some way Not at all Nearly every day Not at all  PHQ-9 Total  Score 16 25 9         Total Time spent with patient: 45 minutes  Musculoskeletal  Strength & Muscle Tone: within normal limits Gait & Station: normal Patient leans: N/A  Psychiatric Specialty Exam  Presentation General Appearance: Appropriate for Environment;Disheveled;Casual  Eye Contact:Good  Speech:Clear and Coherent;Normal Rate  Speech Volume:Decreased  Handedness:Right   Mood and Affect  Mood:Dysphoric  Affect:Appropriate;Depressed;Tearful   Thought Process  Thought Processes:Coherent  Descriptions of  Associations:Intact  Orientation:Full (Time, Place and Person)  Thought Content:WDL  Hallucinations:Hallucinations: None  Ideas of Reference:None  Suicidal Thoughts:Suicidal Thoughts: Yes, Active SI Active Intent and/or Plan: With Intent;With Plan  Homicidal Thoughts:Homicidal Thoughts: No   Sensorium  Memory:Immediate Good;Recent Good;Remote Good  Judgment:Intact  Insight:Fair   Executive Functions  Concentration:Good  Attention Span:Good  Recall:Good  Fund of Knowledge:Good  Language:Good   Psychomotor Activity  Psychomotor Activity:Psychomotor Activity: Normal   Assets  Assets:Communication Skills;Desire for Improvement;Financial Resources/Insurance;Housing;Physical Health;Social Support;Transportation   Sleep  Sleep:Sleep: Poor   Physical Exam Vitals and nursing note reviewed.  Constitutional:      Appearance: She is well-developed.  HENT:     Head: Normocephalic.  Eyes:     Pupils: Pupils are equal, round, and reactive to light.  Cardiovascular:     Rate and Rhythm: Normal rate.  Pulmonary:     Effort: Pulmonary effort is normal.  Musculoskeletal:        General: Normal range of motion.  Neurological:     Mental Status: She is alert and oriented to person, place, and time.  Psychiatric:        Mood and Affect: Mood is depressed. Affect is tearful.        Thought Content: Thought content includes suicidal ideation. Thought content includes suicidal plan.    Review of Systems  Constitutional: Negative.   HENT: Negative.   Eyes: Negative.   Respiratory: Negative.   Cardiovascular: Negative.   Gastrointestinal: Negative.   Genitourinary: Negative.   Musculoskeletal: Negative.   Skin: Negative.   Neurological: Negative.   Endo/Heme/Allergies: Negative.   Psychiatric/Behavioral: Positive for depression and suicidal ideas. The patient is nervous/anxious and has insomnia.     Blood pressure 137/89, pulse (!) 104, temperature 98 F (36.7  C), temperature source Oral, resp. rate 18, height 5\' 4"  (1.626 m), weight 160 lb (72.6 kg), SpO2 99 %. Body mass index is 27.46 kg/m.  Past Psychiatric History: Bipolar I disorder, previous suicide attempt and then hospitalized in 2005   Is the patient at risk to self? Yes  Has the patient been a risk to self in the past 6 months? No .    Has the patient been a risk to self within the distant past? Yes   Is the patient a risk to others? No   Has the patient been a risk to others in the past 6 months? No   Has the patient been a risk to others within the distant past? No   Past Medical History:  Past Medical History:  Diagnosis Date  . Headache   . History of bipolar disorder   . History of pancreatitis   . Hx of varicella   . Migraines   . Vaginal Pap smear, abnormal     Past Surgical History:  Procedure Laterality Date  . CESAREAN SECTION N/A 06/04/2016   Procedure: CESAREAN SECTION;  Surgeon: Tyson Dense, MD;  Location: [facility];  Service: Obstetrics;  Laterality: N/A;  . MOUTH SURGERY  x 2, wisdom teeth, adjust teeth    Family History:  Family History  Problem Relation Age of Onset  . Stroke Father   . Heart attack Father   . Cancer Maternal Aunt        stomach  . Cancer Maternal Grandmother        ovarian  . Headache Mother   . Migraines Brother     Social History:  Social History   Socioeconomic History  . Marital status: Single    Spouse name: Not on file  . Number of children: 1  . Years of education: Not on file  . Highest education level: High school graduate  Occupational History  . Not on file  Tobacco Use  . Smoking status: Former Smoker    Packs/day: 0.50    Types: Cigarettes    Quit date: 09/16/2015    Years since quitting: 4.5  . Smokeless tobacco: Never Used  Vaping Use  . Vaping Use: Never used  Substance and Sexual Activity  . Alcohol use: Not Currently    Alcohol/week: 2.0 standard drinks    Types: 2 Standard  drinks or equivalent per week  . Drug use: Never  . Sexual activity: Yes  Other Topics Concern  . Not on file  Social History Narrative   Lives with child   Caffeine- sodas 16 oz, 2 daily   Social Determinants of Health   Financial Resource Strain:   . Difficulty of Paying Living Expenses: Not on file  Food Insecurity:   . Worried About Charity fundraiser in the Last Year: Not on file  . Ran Out of Food in the Last Year: Not on file  Transportation Needs:   . Lack of Transportation (Medical): Not on file  . Lack of Transportation (Non-Medical): Not on file  Physical Activity:   . Days of Exercise per Week: Not on file  . Minutes of Exercise per Session: Not on file  Stress:   . Feeling of Stress : Not on file  Social Connections:   . Frequency of Communication with Friends and Family: Not on file  . Frequency of Social Gatherings with Friends and Family: Not on file  . Attends Religious Services: Not on file  . Active Member of Clubs or Organizations: Not on file  . Attends Archivist Meetings: Not on file  . Marital Status: Not on file  Intimate Partner Violence:   . Fear of Current or Ex-Partner: Not on file  . Emotionally Abused: Not on file  . Physically Abused: Not on file  . Sexually Abused: Not on file    SDOH:  SDOH Screenings   Alcohol Screen:   . Last Alcohol Screening Score (AUDIT): Not on file  Depression (PHQ2-9): Medium Risk  . PHQ-2 Score: 16  Financial Resource Strain:   . Difficulty of Paying Living Expenses: Not on file  Food Insecurity:   . Worried About Charity fundraiser in the Last Year: Not on file  . Ran Out of Food in the Last Year: Not on file  Housing:   . Last Housing Risk Score: Not on file  Physical Activity:   . Days of Exercise per Week: Not on file  . Minutes of Exercise per Session: Not on file  Social Connections:   . Frequency of Communication with Friends and Family: Not on file  . Frequency of Social Gatherings  with Friends and Family: Not on file  . Attends Religious Services: Not on  file  . Active Member of Clubs or Organizations: Not on file  . Attends Archivist Meetings: Not on file  . Marital Status: Not on file  Stress:   . Feeling of Stress : Not on file  Tobacco Use: Medium Risk  . Smoking Tobacco Use: Former Smoker  . Smokeless Tobacco Use: Never Used  Transportation Needs:   . Film/video editor (Medical): Not on file  . Lack of Transportation (Non-Medical): Not on file    Last Labs:  No visits with results within 6 Month(s) from this visit.  Latest known visit with results is:  Video Visit on 05/08/2019  Component Date Value Ref Range Status  . SARS-CoV-2, NAA 05/08/2019 Not Detected  Not Detected Final   Comment: Testing was performed using the cobas(R) SARS-CoV-2 test. This nucleic acid amplification test was developed and its performance characteristics determined by [employer]. Nucleic acid amplification tests include PCR and TMA. This test has not been FDA cleared or approved. This test has been authorized by FDA under an Emergency Use Authorization (EUA). This test is only authorized for the duration of time the declaration that circumstances exist justifying the authorization of the emergency use of in vitro diagnostic tests for detection of SARS-CoV-2 virus and/or diagnosis of COVID-19 infection under section 564(b)(1) of the Act, 21 U.S.C. 338VAN-1(B) (1), unless the authorization is terminated or revoked sooner. When diagnostic testing is negative, the possibility of a false negative result should be considered in the context of a patient's recent exposures and the presence of clinical signs and symptoms consistent with COVID-19. An individual without symptoms                           of COVID-19 and who is not shedding SARS-CoV-2 virus would expect to have a negative (not detected) result in this assay.     Allergies: Codeine  PTA  Medications: (Not in a hospital admission)   Medical Decision Making  Covid and labs ordered Restarted Lamictal 100 mg QHS and Prozac 40 mg PO QHS (home meds) Started Seroquel 50 mg PO QHS    Recommendations  Based on my evaluation the patient does not appear to have an emergency medical condition.  Lewis Shock, FNP 03/26/20  4:38 PM

## 2020-03-26 NOTE — Telephone Encounter (Signed)
Patients mother called wanting advise- her daughter reached out to her this morning and stated she attempted to hang herself last night.   I spoke with patient's mother and she stated she was with patient and they were en rout here. After discussing with primary care patient was given option of seeking immediate help at ER or going to [facility] walk in clinic.   I was able to confirm with patient she was agreeable to be seen at [facility] walk in clinic and would have her mother take her there now. She was given address and phone number for clinic ([address] in [place]).   Note to PCP

## 2020-03-26 NOTE — ED Triage Notes (Signed)
39 yo female walk-in presents with SI/SUA per pt. Pt states, "I tried to hang myself yesterday and last Friday but I stopped myself". Pt report trigger as boyfriend was cheating on her for the last 2 years and moved out of home 2 months ago leaving pt with 37 yo child. Pt reports feeling stressed. Denies HI.

## 2020-03-26 NOTE — BH Assessment (Addendum)
Comprehensive Clinical Assessment (CCA) Note  03/26/2020 [name] [id number]   Patient is a 39 y.o. female with a history of Bipolar Disorder who presents voluntarily to [facility] for assessment at the recommendation of her PCP.  Patient's mother contacted pt's PCP this afternoon when she learned patient had attempted to hang herself twice within the past week.  Upon assessment,  patient is tearful as she admits to attempting to hang herself last Friday and again last night. Patient reports each time she used a belt, tightening it, attaching it to the door knob with plan to strangle herself.  She admits that last night, she did tighten the belt more and only stopped when she panicked due to struggling to breathe.  The primary stressor has been dealing with a break up with her fianc in July.  Patient reports her fianc admitted he had been cheating on her for the past two years of their relationship.  They tried to live together for a period to co-parent their daughter, until he recently moved out.  She states she feels like a failure and that "I'm a burden with people worrying about me.  I failed as a parent."  She does identify their 30 y.o. daughter as a protective factor.  Patient has one past attempt in 2005, by overdose, after which she was hospitalized at [facility].  She took recommended medications up until the past two years when she wanted to be off of medications.  Up until recently, she feels she was doing fairly well without medicine.  Around the time her fiance moved out, patient saw her PCP and was restarted on Prozac and Wellbutrin, with a recent change from Wellbutrin to lamichtal.  Patient states the lamichtal seemed to be helping up until this past week.  Patient is open to treatment recommendations at this point, to include inpatient treatment.    Patient gave verbal consent for LPC to update her parents on the disposition plan.  Patient's mother shared that patient had  mentioned that she hoped "they would keep me in the hospital for a few days."  She and patient's father have come to [facility] to provide support. They needed to leave to go pick up the patient's daughter from daycare before the daycare closes at 6pm.   Patient's mother states patient can stay with her for as long as she needs to support.  She states patient could also consider staying with her brother or having him stay with her.  They are open to working with patient on a safety plan, if she is cleared for discharge tomorrow.  They are also supportive of an inpatient admission if recommended.    Disposition: Per Marvia Pickles, NP 24 hour continuous assessment is recommended at [facility] for safety and stabilization.  She will be started on medication and reassessed by psychiatry in the morning to determine treatment needs.  Chief Complaint:  Chief Complaint  Patient presents with  . Suicidal   Visit Diagnosis: Bipolar I Disorder, mixed, moderate   CCA Screening, Triage and Referral (STR)  Patient Reported Information How did you hear about [place]? Family/Friend  Referral name: Patient presents with her mother for assessment.  Referral phone number: No data recorded  Whom do you see for routine medical problems? Other (Comment) ([place])  Practice/Facility Name: No data recorded Practice/Facility Phone Number: No data recorded Name of Contact: No data recorded Contact Number: No data recorded Contact Fax Number: No data recorded Prescriber Name:  No data recorded Prescriber Address (if known): No data recorded  What Is the Reason for Your Visit/Call Today? No data recorded How Long Has This Been Causing You Problems? 1-6 months  What Do You Feel Would Help You the Most Today? Medication;Therapy   Have You Recently Been in Any Inpatient Treatment (Hospital/Detox/Crisis Center/28-Day Program)? No  Name/Location of Program/Hospital:No data recorded How Long Were You  There? No data recorded When Were You Discharged? No data recorded  Have You Ever Received Services From [facility] Before? Yes  Who Do You See at [facility]? [place] - primary care   Have You Recently Had Any Thoughts About Hurting Yourself? Yes  Are You Planning to Commit Suicide/Harm Yourself At This time? No   Have you Recently Had Thoughts About [place]? No  Explanation: No data recorded  Have You Used Any Alcohol or Drugs in the Past 24 Hours? Yes  How Long Ago Did You Use Drugs or Alcohol? No data recorded What Did You Use and How Much? 1 glass of wine   Do You Currently Have a Therapist/Psychiatrist? No  Name of Therapist/Psychiatrist: No data recorded  Have You Been Recently Discharged From Any Office Practice or Programs? No  Explanation of Discharge From Practice/Program: No data recorded    CCA Screening Triage Referral Assessment Type of Contact: Face-to-Face  Is this Initial or Reassessment? No data recorded Date Telepsych consult ordered in [facility]:  No data recorded Time Telepsych consult ordered in [facility]:  No data recorded  Patient Reported Information Reviewed? Yes  Patient Left Without Being Seen? No data recorded Reason for Not Completing Assessment: No data recorded  Collateral Involvement: Some collateral provided by patient's mother.   Does Patient Have a Stage manager Guardian? No data recorded Name and Contact of Legal Guardian: No data recorded If Minor and Not Living with Parent(s), Who has Custody? No data recorded Is CPS involved or ever been involved? Never  Is APS involved or ever been involved? Never   Patient Determined To Be At Risk for Harm To Self or Others Based on Review of Patient Reported Information or Presenting Complaint? Yes, for Self-Harm  Method: No data recorded Availability of Means: No data recorded Intent: No data recorded Notification Required: No data  recorded Additional Information for Danger to Others Potential: No data recorded Additional Comments for Danger to Others Potential: No data recorded Are There Guns or Other Weapons in Your Home? No data recorded Types of Guns/Weapons: No data recorded Are These Weapons Safely Secured?                            No data recorded Who Could Verify You Are Able To Have These Secured: No data recorded Do You Have any Outstanding Charges, Pending Court Dates, Parole/Probation? No data recorded Contacted To Inform of Risk of Harm To Self or Others: Family/Significant Other:   Location of Assessment: [facility]   Does Patient Present under Involuntary Commitment? No  IVC Papers Initial File Date: No data recorded  [place] of Residence: [place]   Patient Currently Receiving the Following Services: Not Receiving Services   Determination of Need: Urgent (48 hours)   Options For Referral: [facility];Medication Management;Outpatient Therapy     CCA Biopsychosocial  Intake/Chief Complaint:  Patient presents with her mother for assessment after sharing that she has tried to hang herself twice in the past week. Patient  reports recent break up as primary stressor.   Patient Reported Schizophrenia/Schizoaffective Diagnosis in Past: No   Mental Health Symptoms Depression:  Increase/decrease in appetite;Weight gain/loss;Hopelessness;Change in energy/activity;Sleep (too much or little);Tearfulness;Worthlessness   Duration of Depressive symptoms: Greater than two weeks   Mania:  None   Anxiety:   Worrying;Tension;Sleep   Psychosis:  None   Duration of Psychotic symptoms: No data recorded  Trauma:  None   Obsessions:  None   Compulsions:  None   Inattention:  None   Hyperactivity/Impulsivity:  N/A   Oppositional/Defiant Behaviors:  No data recorded  Emotional Irregularity:  Chronic feelings of emptiness;Mood lability   Other Mood/Personality Symptoms:  No  data recorded   Mental Status Exam Appearance and self-care  Stature:  Average   Weight:  Average weight   Clothing:  Casual   Grooming:  Normal   Cosmetic use:  Age appropriate   Posture/gait:  Tense   Motor activity:  Not Remarkable   Sensorium  Attention:  Normal   Concentration:  Normal   Orientation:  Object;Person;Place;Time   Recall/memory:  Normal   Affect and Mood  Affect:  Depressed;Anxious   Mood:  Depressed   Relating  Eye contact:  Normal   Facial expression:  Depressed   Attitude toward examiner:  Cooperative   Thought and Language  Speech flow: Clear and Coherent   Thought content:  Appropriate to Mood and Circumstances   Preoccupation:  None   Hallucinations:  None   Organization:  No data recorded  [employer] of Knowledge:  Average   Intelligence:  Average   Abstraction:  Normal   Judgement:  Impaired   Reality Testing:  Adequate   Insight:  Lacking   Decision Making:  Impulsive   Social Functioning  Social Maturity:  Responsible   Social Judgement:  Naive   Stress  Stressors:  Relationship   Coping Ability:  Overwhelmed   Skill Deficits:  Interpersonal;Responsibility;Self-control   Supports:  Family      Religion: Religion/Spirituality Are You A Religious Person?: No  Leisure/Recreation: Leisure / Recreation Do You Have Hobbies?: No  Exercise/Diet: Exercise/Diet Do You Exercise?: No Have You Gained or Lost A Significant Amount of Weight in the Past Six Months?: Yes-Lost Number of Pounds Lost?: 30 (lost 30 lbs in 3 months) Do You Follow a Special Diet?: No Do You Have Any Trouble Sleeping?: Yes Explanation of Sleeping Difficulties: restless - only sleeping 4 hours per night most nights   CCA Employment/Education  Employment/Work Situation: Employment / Work Situation Employment situation: Employed Where is patient currently employed?: [employer] How long has patient been  employed?: Unknown Patient's job has been impacted by current illness: No (enjoys her work and does well Energy manager is supportive) What is the longest time patient has a held a job?: UTA Where was the patient employed at that time?: [employer] Has patient ever been in the [employer]?: No  Education: Education Is Patient Currently Attending School?: No Last Grade Completed: 12 Name of [place]: [employer] Did Teacher, adult education From [employer]?: Yes Did Physicist, medical?: No Did You Attend Graduate School?: No Did You Have Any Special Interests In School?: None reported Did You Have An Individualized Education Program (IIEP): No Did You Have Any Difficulty At [employer]?: No Patient's Education Has Been Impacted by Current Illness: No   CCA Family/Childhood History  Family and Relationship History: Family history Marital status: Single Are you sexually active?:  (UTA) What is your sexual  orientation?: UTA Has your sexual activity been affected by drugs, alcohol, medication, or emotional stress?: UTA Does patient have children?: Yes How many children?: 1 How is patient's relationship with their children?: No concerns  Childhood History:  Childhood History By whom was/is the patient raised?: Both parents Additional childhood history information: No concerns to report Description of patient's relationship with caregiver when they were a child: Patient states she has had decent relationships with parents and with her brother. Patient's description of current relationship with people who raised him/her: Good relationships How were you disciplined when you got in trouble as a child/adolescent?: UTA Does patient have siblings?: Yes Number of Siblings: 1 Description of patient's current relationship with siblings: Brother is supportive - recently went through a similar break up and has 2 kids Did patient suffer any verbal/emotional/physical/sexual abuse as a child?: No Did patient suffer from severe  childhood neglect?: No Has patient ever been sexually abused/assaulted/raped as an adolescent or adult?: No Was the patient ever a victim of a crime or a disaster?: No Witnessed domestic violence?: No Has patient been affected by domestic violence as an adult?: No  Child/Adolescent Assessment:     CCA Substance Use  Alcohol/Drug Use: Alcohol / Drug Use Pain Medications: None Prescriptions: Takes prescribed prozad(40mg ) and lamichtal (100mg ) History of alcohol / drug use?: Yes Longest period of sobriety (when/how long): reports she drinks socially/occasionally    ASAM's:  Six Dimensions of Multidimensional Assessment  Dimension 1:  Acute Intoxication and/or Withdrawal Potential:      Dimension 2:  Biomedical Conditions and Complications:      Dimension 3:  Emotional, Behavioral, or Cognitive Conditions and Complications:     Dimension 4:  Readiness to Change:     Dimension 5:  Relapse, Continued use, or Continued Problem Potential:     Dimension 6:  Recovery/Living Environment:     ASAM Severity Score:    ASAM Recommended Level of Treatment:     Substance use Disorder (SUD)    Recommendations for Services/Supports/Treatments:    DSM5 Diagnoses: Patient Active Problem List   Diagnosis Date Noted  . Relationship problems 01/31/2020  . Worsening headaches 10/13/2019  . Paresthesia 10/13/2019  . Upper extremity weakness 10/13/2019  . Other symptoms and signs involving the nervous system 10/13/2019  . 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 Nevus 03/15/2017  . 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 Encounter for insertion of intrauterine contraceptive device (IUD) 08/05/2016  . Iron deficiency anemia 03/20/2016  . Borderline personality disorder ([place]) 07/09/2015  . Generalized anxiety disorder 07/09/2015  . Depression 07/09/2015  . Bipolar 1 disorder, mixed, moderate ([place]) 07/09/2015  . Left breast lump 03/05/2015    Patient Centered Plan: Patient is on the following  Treatment Plan(s):  Depression   Referrals to Alternative Service(s): Continuous assessment at [facility] is recommended, with AM psych eval.   Fransico Meadow , [facility]

## 2020-03-26 NOTE — ED Notes (Signed)
Pt resting in bed while watching television. Pt calm and cooperative w/o any c/o of pain or distress. Will continue to monitor for safety

## 2020-03-27 ENCOUNTER — Telehealth: Payer: Self-pay | Admitting: Neurology

## 2020-03-27 MED ORDER — QUETIAPINE FUMARATE 100 MG PO TABS: 100.0000 mg | ORAL_TABLET | Freq: Every day | ORAL | 0 refills | Status: DC

## 2020-03-27 MED ORDER — QUETIAPINE FUMARATE 100 MG PO TABS: 100.0000 mg | ORAL_TABLET | Freq: Every day | ORAL | Status: DC

## 2020-03-27 NOTE — ED Notes (Signed)
Patient denies SI/HI. Patient given support and encouragement. Monitoring continues.

## 2020-03-27 NOTE — Discharge Instructions (Signed)

## 2020-03-27 NOTE — Telephone Encounter (Signed)
Called patient to check on her and see how she is doing, wanted to make sure she does have behavioral health appointments in place and that she didn't need anything from our office.   LMOM letting patient know we were checking on her and to please give [place] a call back.   I was able to see in her discharge papers that she does have an appointment in place. (our initial appointment for partial hospitalization services is Thursday, 03/28/20 at 1:00p tomorrow via WebEx. A WebEx link will be sent to your listed email. Please contact the office if you have any further question or concerns.)  Luvenia Starch - FYI

## 2020-03-27 NOTE — ED Notes (Signed)
Pt resting on pull out with eyes closed unlabored respirations through night in no acute distress. Safety maintained.

## 2020-03-27 NOTE — ED Notes (Signed)
Pt discharged

## 2020-03-27 NOTE — ED Notes (Signed)
Pt sleeping@this  time.breathing even and unlabored. Will continue to monitor pt for safety

## 2020-03-27 NOTE — ED Provider Notes (Signed)
FBC/OBS ASAP Discharge Summary  Date and Time: 03/27/2020 10:01 AM  Name: [name]  MRN:  [id number]   Discharge Diagnoses:  Final diagnoses:  Bipolar 1 disorder, mixed, moderate (HCC)  Borderline personality disorder ([place])  Adjustment disorder with mixed anxiety and depressed mood    Subjective: Patient reports today that she is feeling much better.  She denies having any suicidal homicidal ideations and denies any hallucinations today.  She states that the Seroquel really assisted her with sleep which she stated is something that she desperately needed.  She states that she still feels some depressed but feels that being around her family will be more beneficial to her than being admitted again.  She states that she plans to discharge and go stay with her mother.  She states that she will have her daughter around her there as well and having other people around her is very beneficial.  She also reports that she wants to get back to work because when she stays busy she does not have these thoughts.  She continues to repeat that she had these thoughts this past weekend because of being alone and her daughter was gone to her parents house.  Patient states that she feels that the medication changes have greatly helped her and that increasing the dose of the Seroquel could potentially help her with her mood as well as sleep.  Stay Summary: Patient is a 39 year old female who presented to the [facility] as a walk-in voluntarily reporting worsening depressive symptoms with suicidal ideations and a reported suicide attempt last night and attempt to hang herself.  Patient reports that she has significant history of bipolar 1 disorder and borderline personality disorder.  Patient was admitted to the continuous observation unit and continued on her home medications of Prozac 40 mg p.o. daily and Lamictal 100 mg p.o. daily.  Patient had complaints of decreased sleep and worsening symptoms of depression including  mood.  Patient discussed and agreed to start Seroquel 50 mg p.o. nightly.  Patient remained in the continuous observation unit overnight for observation.  Today patient has denied any suicidal or homicidal ideations but continues to report some depression.  She states that she likes the Seroquel that she was started on and requests a prescription for it.  After discussing with her in further detail we have decided to increase the Seroquel to 100 mg p.o. nightly.  She reports that she has follow-up already established with a outpatient provider however does not have a psychiatrist.  Patient will be provided with outpatient resources for her commercial insurance.  Patient's medications were E prescribed to pharmacy of choice.  Patient's mother presented to pick her up.  Patient's mother reports that she has no safety concerns with the patient being on the medication and knowing that she is going to stay with her at home.  Patient was discharged home to her mother.  Total Time spent with patient: 30 minutes  Past Psychiatric History: bipolar disorder, previous suicide attempts, previous hospitalizations Past Medical History:  Past Medical History:  Diagnosis Date  . Headache   . History of bipolar disorder   . History of pancreatitis   . Hx of varicella   . Migraines   . Vaginal Pap smear, abnormal     Past Surgical History:  Procedure Laterality Date  . CESAREAN SECTION N/A 06/04/2016   Procedure: CESAREAN SECTION;  Surgeon: Tyson Dense, MD;  Location: [facility];  Service: Obstetrics;  Laterality: N/A;  .  MOUTH SURGERY     x 2, wisdom teeth, adjust teeth   Family History:  Family History  Problem Relation Age of Onset  . Stroke Father   . Heart attack Father   . Cancer Maternal Aunt        stomach  . Cancer Maternal Grandmother        ovarian  . Headache Mother   . Migraines Brother    Family Psychiatric History: None reported Social History:  Social History    Substance and Sexual Activity  Alcohol Use Not Currently  . Alcohol/week: 2.0 standard drinks  . Types: 2 Standard drinks or equivalent per week     Social History   Substance and Sexual Activity  Drug Use Never    Social History   Socioeconomic History  . Marital status: Single    Spouse name: Not on file  . Number of children: 1  . Years of education: Not on file  . Highest education level: High school graduate  Occupational History  . Not on file  Tobacco Use  . Smoking status: Former Smoker    Packs/day: 0.50    Types: Cigarettes    Quit date: 09/16/2015    Years since quitting: 4.5  . Smokeless tobacco: Never Used  Vaping Use  . Vaping Use: Never used  Substance and Sexual Activity  . Alcohol use: Not Currently    Alcohol/week: 2.0 standard drinks    Types: 2 Standard drinks or equivalent per week  . Drug use: Never  . Sexual activity: Yes  Other Topics Concern  . Not on file  Social History Narrative   Lives with child   Caffeine- sodas 16 oz, 2 daily   Social Determinants of Health   Financial Resource Strain:   . Difficulty of Paying Living Expenses: Not on file  Food Insecurity:   . Worried About Charity fundraiser in the Last Year: Not on file  . Ran Out of Food in the Last Year: Not on file  Transportation Needs:   . Lack of Transportation (Medical): Not on file  . Lack of Transportation (Non-Medical): Not on file  Physical Activity:   . Days of Exercise per Week: Not on file  . Minutes of Exercise per Session: Not on file  Stress:   . Feeling of Stress : Not on file  Social Connections:   . Frequency of Communication with Friends and Family: Not on file  . Frequency of Social Gatherings with Friends and Family: Not on file  . Attends Religious Services: Not on file  . Active Member of Clubs or Organizations: Not on file  . Attends Archivist Meetings: Not on file  . Marital Status: Not on file   SDOH:  SDOH Screenings    Alcohol Screen:   . Last Alcohol Screening Score (AUDIT): Not on file  Depression (PHQ2-9): Medium Risk  . PHQ-2 Score: 16  Financial Resource Strain:   . Difficulty of Paying Living Expenses: Not on file  Food Insecurity:   . Worried About Charity fundraiser in the Last Year: Not on file  . Ran Out of Food in the Last Year: Not on file  Housing:   . Last Housing Risk Score: Not on file  Physical Activity:   . Days of Exercise per Week: Not on file  . Minutes of Exercise per Session: Not on file  Social Connections:   . Frequency of Communication with Friends and Family: Not on file  .  Frequency of Social Gatherings with Friends and Family: Not on file  . Attends Religious Services: Not on file  . Active Member of Clubs or Organizations: Not on file  . Attends Archivist Meetings: Not on file  . Marital Status: Not on file  Stress:   . Feeling of Stress : Not on file  Tobacco Use: Medium Risk  . Smoking Tobacco Use: Former Smoker  . Smokeless Tobacco Use: Never Used  Transportation Needs:   . Film/video editor (Medical): Not on file  . Lack of Transportation (Non-Medical): Not on file    Has this patient used any form of tobacco in the last 30 days? (Cigarettes, Smokeless Tobacco, Cigars, and/or Pipes) Prescription not provided because: Does not smoke  Current Medications:  Current Facility-Administered Medications  Medication Dose Route Frequency Provider Last Rate Last Admin  . acetaminophen (TYLENOL) tablet 650 mg  650 mg Oral Q6H PRN Antonin Meininger, Lowry Ram, FNP      . alum & mag hydroxide-simeth (MAALOX/MYLANTA) 200-200-20 MG/5ML suspension 30 mL  30 mL Oral Q4H PRN Falyn Rubel, Lowry Ram, FNP      . FLUoxetine (PROZAC) capsule 40 mg  40 mg Oral QHS Edrie Ehrich, Lowry Ram, FNP   40 mg at 03/26/20 2110  . lamoTRIgine (LAMICTAL) tablet 100 mg  100 mg Oral QHS Venise Ellingwood B, FNP   100 mg at 03/26/20 2109  . magnesium hydroxide (MILK OF MAGNESIA) suspension 30 mL  30 mL Oral  Daily PRN Dene Landsberg, Lowry Ram, FNP      . QUEtiapine (SEROQUEL) tablet 100 mg  100 mg Oral QHS Nazim Kadlec, Lowry Ram, FNP       Current Outpatient Medications  Medication Sig Dispense Refill  . clonazePAM (KLONOPIN) 0.5 MG tablet Take 1 tablet (0.5 mg total) by mouth 2 (two) times daily as needed for anxiety. 20 tablet 0  . eletriptan (RELPAX) 20 MG tablet TAKE 1 TABLET (20 MG TOTAL) BY MOUTH AS NEEDED FOR MIGRAINE OR HEADACHE. MAY REPEAT IN 2 HOURS IF HEADACHE PERSISTS OR RECURS. 10 tablet 0  . FLUoxetine (PROZAC) 40 MG capsule Take 1 capsule (40 mg total) by mouth daily. 90 capsule 0  . Rimegepant Sulfate (NURTEC) 75 MG TBDP Take 75 mg by mouth daily as needed. 8 tablet 6  . fluticasone (FLONASE) 50 MCG/ACT nasal spray Place into both nostrils daily as needed.     . Fremanezumab-vfrm (AJOVY) 225 MG/1.5ML SOAJ Inject 225 mg into the skin every 30 (thirty) days. 3 pen 4  . lamoTRIgine (LAMICTAL) 100 MG tablet Take 1 tablet (100 mg total) by mouth daily. appt for refills 30 tablet 1  . levonorgestrel (MIRENA, 52 MG,) 20 MCG/24HR IUD Inserted 08/04/2016 1 each 0    PTA Medications: (Not in a hospital admission)   Musculoskeletal  Strength & Muscle Tone: within normal limits Gait & Station: normal Patient leans: N/A  Psychiatric Specialty Exam  Presentation  General Appearance: Appropriate for Environment;Casual  Eye Contact:Good  Speech:Clear and Coherent;Normal Rate  Speech Volume:Normal  Handedness:Right   Mood and Affect  Mood:Anxious  Affect:Appropriate;Congruent   Thought Process  Thought Processes:Coherent  Descriptions of Associations:Intact  Orientation:Full (Time, Place and Person)  Thought Content:WDL  Hallucinations:Hallucinations: None  Ideas of Reference:None  Suicidal Thoughts:Suicidal Thoughts: No SI Active Intent and/or Plan: With Intent;With Plan  Homicidal Thoughts:Homicidal Thoughts: No   Sensorium  Memory:Immediate Good;Recent Good;Remote  Good  Judgment:Intact  Insight:Good   Executive Functions  Concentration:Good  Attention Span:Good  Recall:Good  Fund of  Knowledge:Good  Language:Good   Psychomotor Activity  Psychomotor Activity:Psychomotor Activity: Normal   Assets  Assets:Communication Skills;Desire for Improvement;Financial Resources/Insurance;Housing;Physical Health;Social Support;Transportation   Sleep  Sleep:Sleep: Good   Physical Exam  Physical Exam Vitals and nursing note reviewed.  Constitutional:      Appearance: She is well-developed.  HENT:     Head: Normocephalic.  Eyes:     Pupils: Pupils are equal, round, and reactive to light.  Cardiovascular:     Rate and Rhythm: Normal rate.  Pulmonary:     Effort: Pulmonary effort is normal.  Musculoskeletal:        General: Normal range of motion.  Neurological:     Mental Status: She is alert and oriented to person, place, and time.    Review of Systems  Constitutional: Negative.   HENT: Negative.   Eyes: Negative.   Respiratory: Negative.   Cardiovascular: Negative.   Gastrointestinal: Negative.   Genitourinary: Negative.   Musculoskeletal: Negative.   Skin: Negative.   Neurological: Negative.   Endo/Heme/Allergies: Negative.   Psychiatric/Behavioral: Positive for depression. Negative for suicidal ideas.   Blood pressure 111/78, pulse 76, temperature 98 F (36.7 C), temperature source Oral, resp. rate 18, height 5\' 4"  (1.626 m), weight 160 lb (72.6 kg), SpO2 100 %. Body mass index is 27.46 kg/m.  Demographic Factors:  Caucasian  Loss Factors: Loss of significant relationship  Historical Factors: Prior suicide attempts  Risk Reduction Factors:   Responsible for children under 69 years of age, Sense of responsibility to family, Employed, Living with another person, especially a relative, Positive social support and Positive therapeutic relationship  Continued Clinical Symptoms:  Previous Psychiatric Diagnoses and  Treatments  Cognitive Features That Contribute To Risk:  None    Suicide Risk:  Mild:  Suicidal ideation of limited frequency, intensity, duration, and specificity.  There are no identifiable plans, no associated intent, mild dysphoria and related symptoms, good self-control (both objective and subjective assessment), few other risk factors, and identifiable protective factors, including available and accessible social support.  Plan Of Care/Follow-up recommendations:  Continue activity as tolerated. Continue diet as recommended by your PCP. Ensure to keep all appointments with outpatient providers.  Disposition: Discharge home to mother  Lewis Shock, FNP 03/27/2020, 10:01 AM

## 2020-03-27 NOTE — ED Notes (Signed)
Patient denies SI/HI. Patient discharged home. AVS/Follow-Up/Prescriptions reviewed with patient and written copies given to patient. Patient verbalized understanding. Patient escorted to lobby by staff to meet mom who took her home.

## 2020-03-28 ENCOUNTER — Other Ambulatory Visit (HOSPITAL_COMMUNITY): Payer: No Typology Code available for payment source | Attending: Psychiatry | Admitting: Professional

## 2020-03-28 ENCOUNTER — Other Ambulatory Visit: Payer: Self-pay

## 2020-03-28 DIAGNOSIS — F3162 Bipolar disorder, current episode mixed, moderate: Secondary | ICD-10-CM

## 2020-03-28 NOTE — Psych (Signed)
Virtual Visit via Video Note  I connected with [name] on 03/28/20 at  1:00 PM EST by a video enabled telemedicine application and verified that I am speaking with the correct person using two identifiers.  Location: Patient: Home Provider: Clinical Home Office   I discussed the limitations of evaluation and management by telemedicine and the availability of in person appointments. The patient expressed understanding and agreed to proceed.  Follow Up Instructions:    I discussed the assessment and treatment plan with the patient. The patient was provided an opportunity to ask questions and all were answered. The patient agreed with the plan and demonstrated an understanding of the instructions.   The patient was advised to call back or seek an in-person evaluation if the symptoms worsen or if the condition fails to improve as anticipated.  I provided 15 minutes of non-face-to-face time during this encounter.   Correne Lalani J Aiyana Stegmann, LCMHCA   Cln met with pt to complete a CCA for PHP. Cln started by orienting pt to PHP. Pt reported immediately that she would be unable to attend group from 9-1 due to work conflict. Pt declines to continue with CCA since she is unable to attend PHP. Pt states she would like to see an individual counselor and psychiatrist. Cln discussed options, including staying within this office for providers and outside resources. Pt reports wanting both and cln will email to pt after getting in contact with front desk staff for appointment information within this agency. Cln and pt spent time safety planning: Pt reports parents moved her home for the time being and she feels safe at their house. Pt reports she will first talk to parents, then reach out to [facility] or suicide hotline (1-800-SUICIDE 346-176-8211) or 1-800-273-TALK (8255)). Pt also understands options of going to [facility] or ED if [facility] is not close by and calling 911 if needed. Pt denies any safety concerns at this  time.  Cln informed by Darden Amber, front desk staff, that the [place] office does not have openings at this time. Cln emailed the patient the following referrals: [facility] (Phone: [phone]; Address: [address], [place], [place] [address]); [facility] (for Counseling) (Address: [address], [place], [place] [address]; Phone: [phone]); [facility] (for counseling) (Address: [address], [place], [place] [address]; Phone: [phone]); [facility] (for Psychiatry) (Address: [address], [place], [place] [address]; Phone: [phone]) and [facility] (for both) (Address: [address], [place], [place] [address]; Phone: [phone]). Cln also encouraged pt to call PHP office if she changes her mind about the PHP program or needs the program in the future and can accommodate the schedule. Cln encourages pt to reach out to this cln if more referrals are needed.

## 2020-04-11 ENCOUNTER — Other Ambulatory Visit: Payer: Self-pay | Admitting: Physician Assistant

## 2020-04-11 DIAGNOSIS — F331 Major depressive disorder, recurrent, moderate: Secondary | ICD-10-CM

## 2020-04-11 DIAGNOSIS — F3162 Bipolar disorder, current episode mixed, moderate: Secondary | ICD-10-CM

## 2020-04-11 DIAGNOSIS — F411 Generalized anxiety disorder: Secondary | ICD-10-CM

## 2020-04-11 DIAGNOSIS — F603 Borderline personality disorder: Secondary | ICD-10-CM

## 2020-04-15 ENCOUNTER — Other Ambulatory Visit: Payer: Self-pay | Admitting: Physician Assistant

## 2020-04-16 ENCOUNTER — Encounter: Payer: Self-pay | Admitting: Physician Assistant

## 2020-04-16 ENCOUNTER — Telehealth (INDEPENDENT_AMBULATORY_CARE_PROVIDER_SITE_OTHER): Payer: No Typology Code available for payment source | Admitting: Physician Assistant

## 2020-04-16 VITALS — Temp 97.0°F | Ht 64.0 in | Wt 160.0 lb

## 2020-04-16 DIAGNOSIS — F331 Major depressive disorder, recurrent, moderate: Secondary | ICD-10-CM

## 2020-04-16 DIAGNOSIS — G43109 Migraine with aura, not intractable, without status migrainosus: Secondary | ICD-10-CM

## 2020-04-16 DIAGNOSIS — F603 Borderline personality disorder: Secondary | ICD-10-CM

## 2020-04-16 DIAGNOSIS — F3162 Bipolar disorder, current episode mixed, moderate: Secondary | ICD-10-CM | POA: Diagnosis not present

## 2020-04-16 DIAGNOSIS — F411 Generalized anxiety disorder: Secondary | ICD-10-CM | POA: Diagnosis not present

## 2020-04-16 DIAGNOSIS — Z639 Problem related to primary support group, unspecified: Secondary | ICD-10-CM

## 2020-04-16 MED ORDER — FLUOXETINE HCL 40 MG PO CAPS: 40.0000 mg | ORAL_CAPSULE | Freq: Every day | ORAL | 0 refills | Status: DC

## 2020-04-16 MED ORDER — LAMOTRIGINE 100 MG PO TABS: 100.0000 mg | ORAL_TABLET | Freq: Two times a day (BID) | ORAL | 0 refills | Status: DC

## 2020-04-16 MED ORDER — NURTEC 75 MG PO TBDP: 75.0000 mg | ORAL_TABLET | Freq: Every day | ORAL | 6 refills | Status: DC | PRN

## 2020-04-16 MED ORDER — CLONAZEPAM 0.5 MG PO TABS: 0.5000 mg | ORAL_TABLET | Freq: Two times a day (BID) | ORAL | 0 refills | Status: DC | PRN

## 2020-04-16 MED ORDER — QUETIAPINE FUMARATE 100 MG PO TABS: 100.0000 mg | ORAL_TABLET | Freq: Every day | ORAL | 0 refills | Status: DC

## 2020-04-16 NOTE — Progress Notes (Signed)
F/u for mood.  PHQ9 (20)-GAD7 (21) completed.  She states Quetiapine helped with sleep but not with mood

## 2020-04-16 NOTE — Progress Notes (Signed)
Patient ID: [name], female   DOB: 25-Feb-1981, 39 y.o.   MRN: [id number] .[name] KitchenVirtual Visit via Video Note  I connected with [name] on 04/16/20 at 11:10 AM EST by a video enabled telemedicine application and verified that I am speaking with the correct person using two identifiers.  Location: Patient: work Provider: clinic   I discussed the limitations of evaluation and management by telemedicine and the availability of in person appointments. The patient expressed understanding and agreed to proceed.  The above patient, myself and PA student Ola Spurr present for visit.   History of Present Illness: Patient is a 38 year old female with bipolar 1 disorder, borderline personality disorder, major depression, trouble sleeping, migraines who presents to the clinic for follow-up after hospitalization after suicide attempt.  On 03/26/2020 she told her mother she had attempted to hang herself the night before.  She was taken to the behavioral health urgent care in [place].  She was observed for 1 night.  Her Seroquel was increased to 100 mg.  This did help her sleep which helped her mood.  She was discharged home as long as her mother watch her.  She is still living with her mother and father.  She feels like the Seroquel is helping significantly with sleep.  She does feel like it is helping somewhat with her mood.  She does not feel like it is helping enough with her depression and anxiety symptoms.  She is compliant with her Prozac and Lamictal and as needed clonazepam.  She denies any active plans to hurt herself.  She still endorses some thoughts of being better off dead.  She is in daily talk therapy and she feels like this is significantly helping her process her divorce and life changes. No hallucinations.   .. 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   Worsening headaches 10/13/2019   Paresthesia 10/13/2019   Upper extremity weakness 10/13/2019   Other symptoms and signs involving the nervous system 10/13/2019   Relationship problems 01/31/2020   Resolved Ambulatory Problems    Diagnosis Date Noted   Nipple discharge 03/05/2015   [redacted] weeks gestation of pregnancy 03/20/2016   Normal pregnancy 06/03/2016   Class 1 obesity due to excess calories without serious comorbidity with body mass index (BMI) of 32.0 to 32.9 in adult 04/20/2018   Past Medical History:  Diagnosis Date   Headache    History of bipolar disorder    History of pancreatitis    Hx of varicella    Migraines    Vaginal Pap smear, abnormal    Reviewed med, allergy, problem list.    Observations/Objective: No acute distress Normal breathing Normal mood today and appearance.  .. Today's Vitals   04/16/20 1048  Temp: (!) 97 F (36.1 C)  TempSrc: Oral  Weight: 160 lb (72.6 kg)  Height: 5\' 4"  (1.626 m)   Body mass index is 27.46 kg/m.   .. Depression screen [facility] 2/9 04/16/2020 01/31/2020 12/19/2019 06/21/2018 04/20/2018  Decreased Interest 3 1 3 1 1   Down, Depressed, Hopeless 3 3 3 1 1   PHQ - 2 Score  6 4 6 2 2   Altered sleeping 1 1 1 1 2   Tired, decreased energy 3 1 3 1 2   Change in appetite 1 1 3 2 3   Feeling bad or failure about yourself  3 3 3 1 1   Trouble concentrating 1 3 3 1 1   Moving slowly or fidgety/restless 2 3 3 1  0  Suicidal thoughts 3 0 3 0 0  PHQ-9 Score 20 16 25 9 11   Difficult doing work/chores Very difficult Somewhat difficult Very difficult Not difficult at all Not difficult at all  Some recent data might be hidden   .[name] GAD 7  : Generalized Anxiety Score 04/16/2020 01/31/2020 12/19/2019 06/21/2018  Nervous, Anxious, on Edge 3 3 3 2   Control/stop worrying 3 3 3 1   Worry too much - different things 3 3 3 2   Trouble relaxing 3 3 3 1   Restless 3 0 3 1  Easily annoyed or irritable 3 3 3 2   Afraid - awful might happen 3 1 3 1   Total GAD 7 Score 21 16 21 10   Anxiety Difficulty Extremely difficult Very difficult Very difficult Not difficult at all     Assessment and Plan: [name] KitchenMarland KitchenKrissy was seen today for depression.  Diagnoses and all orders for this visit:  Bipolar 1 disorder, mixed, moderate (HCC) -     FLUoxetine (PROZAC) 40 MG capsule; Take 1 capsule (40 mg total) by mouth daily. -     QUEtiapine (SEROQUEL) 100 MG tablet; Take 1 tablet (100 mg total) by mouth at bedtime. -     lamoTRIgine (LAMICTAL) 100 MG tablet; Take 1 tablet (100 mg total) by mouth 2 (two) times daily.  Borderline personality disorder ([place]) -     FLUoxetine (PROZAC) 40 MG capsule; Take 1 capsule (40 mg total) by mouth daily.  Generalized anxiety disorder -     FLUoxetine (PROZAC) 40 MG capsule; Take 1 capsule (40 mg total) by mouth daily. -     clonazePAM (KLONOPIN) 0.5 MG tablet; Take 1 tablet (0.5 mg total) by mouth 2 (two) times daily as needed for anxiety.  Moderate episode of recurrent major depressive disorder (HCC) -     FLUoxetine (PROZAC) 40 MG capsule; Take 1 capsule (40 mg total) by mouth daily. -     lamoTRIgine (LAMICTAL) 100 MG tablet; Take 1 tablet (100 mg total) by mouth 2 (two) times daily.  Relationship problems -     FLUoxetine (PROZAC) 40 MG capsule; Take 1 capsule (40 mg total) by mouth daily.  Ocular migraine -     Rimegepant Sulfate (NURTEC) 75 MG TBDP; Take 75 mg by mouth daily as needed.   Continue with therapy daily.  Increased lamictal to 100mg  bid.  Continue prozac, seroquel and klonapin as needed.  Follow up in 4-6 weeks.  Follow up sooner with any worsening thoughts of self harm.   Migraines controlled-nurtec  works great as Advertising copywriter. Sent refill. Continue ajovy for prevention.    Follow Up Instructions:    I discussed the assessment and treatment plan with the patient. The patient was provided an opportunity to ask questions and all were answered. The patient agreed with the plan and demonstrated an understanding of the instructions.   The patient was advised to call back or seek an in-person evaluation if the symptoms worsen or if the condition fails to improve as anticipated.   Iran Planas, PA-C

## 2020-04-21 ENCOUNTER — Other Ambulatory Visit: Payer: Self-pay

## 2020-04-21 ENCOUNTER — Ambulatory Visit (HOSPITAL_COMMUNITY)
Admission: EM | Admit: 2020-04-21 | Discharge: 2020-04-22 | Disposition: A | Discharge status: Other Acute Inpatient Hospital | Payer: No Typology Code available for payment source | Attending: Behavioral Health | Admitting: Behavioral Health

## 2020-04-21 ENCOUNTER — Encounter (HOSPITAL_COMMUNITY): Payer: Self-pay | Admitting: Emergency Medicine

## 2020-04-21 DIAGNOSIS — F332 Major depressive disorder, recurrent severe without psychotic features: Secondary | ICD-10-CM | POA: Diagnosis present

## 2020-04-21 DIAGNOSIS — F319 Bipolar disorder, unspecified: Secondary | ICD-10-CM | POA: Diagnosis not present

## 2020-04-21 DIAGNOSIS — F411 Generalized anxiety disorder: Secondary | ICD-10-CM

## 2020-04-21 DIAGNOSIS — Z20822 Contact with and (suspected) exposure to covid-19: Secondary | ICD-10-CM | POA: Insufficient documentation

## 2020-04-21 DIAGNOSIS — F603 Borderline personality disorder: Secondary | ICD-10-CM | POA: Diagnosis not present

## 2020-04-21 DIAGNOSIS — F4323 Adjustment disorder with mixed anxiety and depressed mood: Secondary | ICD-10-CM | POA: Insufficient documentation

## 2020-04-21 LAB — LIPID PANEL
Cholesterol: 184 mg/dL (ref 0–200)
HDL: 46 mg/dL (ref >40)
LDL Cholesterol: 128 mg/dL — ABNORMAL HIGH (ref 0–99)
Total CHOL/HDL Ratio: 4 RATIO
Triglycerides: 52 mg/dL (ref <150)
VLDL: 10 mg/dL (ref 0–40)

## 2020-04-21 LAB — CBC WITH DIFFERENTIAL/PLATELET
Abs Immature Granulocytes: 0.04 10*3/uL (ref 0.00–0.07)
Basophils Absolute: 0.1 10*3/uL (ref 0.0–0.1)
Basophils Relative: 1 %
Eosinophils Absolute: 0.1 10*3/uL (ref 0.0–0.5)
Eosinophils Relative: 1 %
HCT: 40.3 % (ref 36.0–46.0)
Hemoglobin: 13.3 g/dL (ref 12.0–15.0)
Immature Granulocytes: 0 %
Lymphocytes Relative: 15 %
Lymphs Abs: 1.6 10*3/uL (ref 0.7–4.0)
MCH: 30.6 pg (ref 26.0–34.0)
MCHC: 33 g/dL (ref 30.0–36.0)
MCV: 92.6 fL (ref 80.0–100.0)
Monocytes Absolute: 0.6 10*3/uL (ref 0.1–1.0)
Monocytes Relative: 6 %
Neutro Abs: 8 10*3/uL — ABNORMAL HIGH (ref 1.7–7.7)
Neutrophils Relative %: 77 %
Platelets: 406 10*3/uL — ABNORMAL HIGH (ref 150–400)
RBC: 4.35 MIL/uL (ref 3.87–5.11)
RDW: 12.4 % (ref 11.5–15.5)
WBC: 10.5 10*3/uL (ref 4.0–10.5)
nRBC: 0 % (ref 0.0–0.2)

## 2020-04-21 LAB — COMPREHENSIVE METABOLIC PANEL
ALT: 13 U/L (ref 0–44)
AST: 15 U/L (ref 15–41)
Albumin: 3.8 g/dL (ref 3.5–5.0)
Alkaline Phosphatase: 83 U/L (ref 38–126)
Anion gap: 13 (ref 5–15)
BUN: 11 mg/dL (ref 6–20)
CO2: 23 mmol/L (ref 22–32)
Calcium: 9.4 mg/dL (ref 8.9–10.3)
Chloride: 101 mmol/L (ref 98–111)
Creatinine, Ser: 0.75 mg/dL (ref 0.44–1.00)
GFR, Estimated: >60 mL/min (ref >60)
Glucose, Bld: 86 mg/dL (ref 70–99)
Potassium: 3.8 mmol/L (ref 3.5–5.1)
Sodium: 137 mmol/L (ref 135–145)
Total Bilirubin: 0.6 mg/dL (ref 0.3–1.2)
Total Protein: 6.4 g/dL — ABNORMAL LOW (ref 6.5–8.1)

## 2020-04-21 LAB — POCT URINE DRUG SCREEN - MANUAL ENTRY (I-SCREEN)
POC Amphetamine UR: NOT DETECTED
POC Buprenorphine (BUP): NOT DETECTED
POC Cocaine UR: NOT DETECTED
POC Marijuana UR: NOT DETECTED
POC Methadone UR: NOT DETECTED
POC Methamphetamine UR: NOT DETECTED
POC Morphine: POSITIVE — AB
POC Oxazepam (BZO): POSITIVE — AB
POC Oxycodone UR: NOT DETECTED
POC Secobarbital (BAR): NOT DETECTED

## 2020-04-21 LAB — ETHANOL: Alcohol, Ethyl (B): <10 mg/dL (ref <10)

## 2020-04-21 LAB — RESP PANEL BY RT-PCR (FLU A&B, COVID) ARPGX2
Influenza A by PCR: NEGATIVE
Influenza B by PCR: NEGATIVE
SARS Coronavirus 2 by RT PCR: NEGATIVE

## 2020-04-21 LAB — POC SARS CORONAVIRUS 2 AG: SARS Coronavirus 2 Ag: NEGATIVE

## 2020-04-21 LAB — GLUCOSE, CAPILLARY: Glucose-Capillary: 95 mg/dL (ref 70–99)

## 2020-04-21 LAB — HEMOGLOBIN A1C
Hgb A1c MFr Bld: 5.1 % (ref 4.8–5.6)
Mean Plasma Glucose: 99.67 mg/dL

## 2020-04-21 LAB — TSH: TSH: 2.469 u[IU]/mL (ref 0.350–4.500)

## 2020-04-21 LAB — BILIRUBIN, DIRECT: Bilirubin, Direct: <0.1 mg/dL (ref 0.0–0.2)

## 2020-04-21 LAB — POCT PREGNANCY, URINE: Preg Test, Ur: NEGATIVE

## 2020-04-21 LAB — MAGNESIUM: Magnesium: 2 mg/dL (ref 1.7–2.4)

## 2020-04-21 LAB — POC SARS CORONAVIRUS 2 AG -  ED: SARS Coronavirus 2 Ag: NEGATIVE

## 2020-04-21 MED ORDER — QUETIAPINE FUMARATE 100 MG PO TABS
100.0000 mg | ORAL_TABLET | Freq: Every day | ORAL | Status: DC
  Administered 2020-04-21: 100 mg via ORAL
  Filled 2020-04-21: qty 1

## 2020-04-21 MED ORDER — FLUOXETINE HCL 20 MG PO CAPS
40.0000 mg | ORAL_CAPSULE | Freq: Every day | ORAL | Status: DC
  Administered 2020-04-21: 40 mg via ORAL
  Filled 2020-04-21: qty 2

## 2020-04-21 MED ORDER — MAGNESIUM HYDROXIDE 400 MG/5ML PO SUSP: 30.0000 mL | Freq: Every day | ORAL | Status: DC | PRN

## 2020-04-21 MED ORDER — FLUTICASONE PROPIONATE 50 MCG/ACT NA SUSP: 2.0000 | Freq: Every day | NASAL | Status: DC | PRN

## 2020-04-21 MED ORDER — LAMOTRIGINE 100 MG PO TABS
100.0000 mg | ORAL_TABLET | Freq: Two times a day (BID) | ORAL | Status: DC
  Administered 2020-04-21 (×2): 100 mg via ORAL
  Filled 2020-04-21 (×2): qty 1

## 2020-04-21 MED ORDER — ALUM & MAG HYDROXIDE-SIMETH 200-200-20 MG/5ML PO SUSP: 30.0000 mL | ORAL | Status: DC | PRN

## 2020-04-21 MED ORDER — ACETAMINOPHEN 325 MG PO TABS: 650.0000 mg | ORAL_TABLET | Freq: Four times a day (QID) | ORAL | Status: DC | PRN

## 2020-04-21 NOTE — ED Provider Notes (Signed)
Behavioral Health Progress Note  Date and Time: 04/21/2020 1:46 PM Name: [name] MRN:  [id number]  Subjective: Patient states "I took too many pills on Friday, I was with my brother and his 2 kids in my kid and I became overwhelmed."  Patient reports she is staying with her brother for a few days while her parents are out of town until Wednesday.  Patient reports after a hanging attempt on November 8 her family does not allow her to be alone at any time.  Patient endorses passive suicidal ideations currently.  Patient reports feeling suicidal since June 2021 when her fianc cheated on her.  Patient contracts verbally for safety with this Probation officer.  Patient endorses 1 prior suicide attempt when she attempted to hang herself on March 25, 2020.  Patient reports recent stressor includes fianc cheating on her in June as well as parents being out of town currently.  Patient resides in [place] with her mother and father.  Patient also has joint custody of her 91-year-old daughter.  Patient reports a 28-year-old daughter is currently with her daughter's father.  Patient denies access to weapons.  Patient reports she is employed at [employer].  Patient endorses alcohol use rarely.  Patient denies substance use aside from alcohol.  Patient reports she is currently treated for mood disorder by her primary care provider.  Patient endorses compliance with current medications including Klonopin, Prozac, Lamictal and Seroquel.  Patient reports she is currently seeking an outpatient psychiatrist.  Patient currently followed by outpatient talk therapy on "talk space."  Patient reports she speaks with her therapist on talk space daily and believes this is therapeutic for her.  Patient offered support and encouragement.    Diagnosis:  Final diagnoses:  Generalized anxiety disorder    Total Time spent with patient: 30 minutes  Past Psychiatric History: Borderline personality disorder, bipolar 1  disorder, depression, generalized anxiety disorder Past Medical History:  Past Medical History:  Diagnosis Date  . Headache   . History of bipolar disorder   . History of pancreatitis   . Hx of varicella   . Migraines   . Vaginal Pap smear, abnormal     Past Surgical History:  Procedure Laterality Date  . CESAREAN SECTION N/A 06/04/2016   Procedure: CESAREAN SECTION;  Surgeon: Tyson Dense, MD;  Location: [facility];  Service: Obstetrics;  Laterality: N/A;  . MOUTH SURGERY     x 2, wisdom teeth, adjust teeth   Family History:  Family History  Problem Relation Age of Onset  . Stroke Father   . Heart attack Father   . Cancer Maternal Aunt        stomach  . Cancer Maternal Grandmother        ovarian  . Headache Mother   . Migraines Brother    Family Psychiatric  History: None reported Social History:  Social History   Substance and Sexual Activity  Alcohol Use Not Currently  . Alcohol/week: 2.0 standard drinks  . Types: 2 Standard drinks or equivalent per week     Social History   Substance and Sexual Activity  Drug Use Never    Social History   Socioeconomic History  . Marital status: Single    Spouse name: Not on file  . Number of children: 1  . Years of education: Not on file  . Highest education level: High school graduate  Occupational History  . Not on file  Tobacco Use  . Smoking status:  Former Smoker    Packs/day: 0.50    Types: Cigarettes    Quit date: 09/16/2015    Years since quitting: 4.6  . Smokeless tobacco: Never Used  Vaping Use  . Vaping Use: Never used  Substance and Sexual Activity  . Alcohol use: Not Currently    Alcohol/week: 2.0 standard drinks    Types: 2 Standard drinks or equivalent per week  . Drug use: Never  . Sexual activity: Yes  Other Topics Concern  . Not on file  Social History Narrative   Lives with child   Caffeine- sodas 16 oz, 2 daily   Social Determinants of Health   Financial Resource Strain:    . Difficulty of Paying Living Expenses: Not on file  Food Insecurity:   . Worried About Charity fundraiser in the Last Year: Not on file  . Ran Out of Food in the Last Year: Not on file  Transportation Needs:   . Lack of Transportation (Medical): Not on file  . Lack of Transportation (Non-Medical): Not on file  Physical Activity:   . Days of Exercise per Week: Not on file  . Minutes of Exercise per Session: Not on file  Stress:   . Feeling of Stress : Not on file  Social Connections:   . Frequency of Communication with Friends and Family: Not on file  . Frequency of Social Gatherings with Friends and Family: Not on file  . Attends Religious Services: Not on file  . Active Member of Clubs or Organizations: Not on file  . Attends Archivist Meetings: Not on file  . Marital Status: Not on file   SDOH:  SDOH Screenings   Alcohol Screen:   . Last Alcohol Screening Score (AUDIT): Not on file  Depression (PHQ2-9): Medium Risk  . PHQ-2 Score: 24  Financial Resource Strain:   . Difficulty of Paying Living Expenses: Not on file  Food Insecurity:   . Worried About Charity fundraiser in the Last Year: Not on file  . Ran Out of Food in the Last Year: Not on file  Housing:   . Last Housing Risk Score: Not on file  Physical Activity:   . Days of Exercise per Week: Not on file  . Minutes of Exercise per Session: Not on file  Social Connections:   . Frequency of Communication with Friends and Family: Not on file  . Frequency of Social Gatherings with Friends and Family: Not on file  . Attends Religious Services: Not on file  . Active Member of Clubs or Organizations: Not on file  . Attends Archivist Meetings: Not on file  . Marital Status: Not on file  Stress:   . Feeling of Stress : Not on file  Tobacco Use: Medium Risk  . Smoking Tobacco Use: Former Smoker  . Smokeless Tobacco Use: Never Used  Transportation Needs:   . Film/video editor (Medical): Not  on file  . Lack of Transportation (Non-Medical): Not on file   Additional Social History:                         Sleep: Fair  Appetite:  Fair  Current Medications:  Current Facility-Administered Medications  Medication Dose Route Frequency Provider Last Rate Last Admin  . acetaminophen (TYLENOL) tablet 650 mg  650 mg Oral Q6H PRN Caroline Sauger, NP      . alum & mag hydroxide-simeth (MAALOX/MYLANTA) 200-200-20 MG/5ML suspension 30 mL  30 mL Oral Q4H PRN Caroline Sauger, NP      . FLUoxetine (PROZAC) capsule 40 mg  40 mg Oral Daily Caroline Sauger, NP   40 mg at 04/21/20 1040  . fluticasone (FLONASE) 50 MCG/ACT nasal spray 2 spray  2 spray Each Nare Daily PRN Caroline Sauger, NP      . lamoTRIgine (LAMICTAL) tablet 100 mg  100 mg Oral BID Caroline Sauger, NP   100 mg at 04/21/20 1040  . magnesium hydroxide (MILK OF MAGNESIA) suspension 30 mL  30 mL Oral Daily PRN Caroline Sauger, NP      . QUEtiapine (SEROQUEL) tablet 100 mg  100 mg Oral QHS Caroline Sauger, NP       Current Outpatient Medications  Medication Sig Dispense Refill  . clonazePAM (KLONOPIN) 0.5 MG tablet Take 1 tablet (0.5 mg total) by mouth 2 (two) times daily as needed for anxiety. 30 tablet 0  . FLUoxetine (PROZAC) 40 MG capsule Take 1 capsule (40 mg total) by mouth daily. 90 capsule 0  . fluticasone (FLONASE) 50 MCG/ACT nasal spray Place into both nostrils daily as needed.     . Fremanezumab-vfrm (AJOVY) 225 MG/1.5ML SOAJ Inject 225 mg into the skin every 30 (thirty) days. 3 pen 4  . lamoTRIgine (LAMICTAL) 100 MG tablet Take 1 tablet (100 mg total) by mouth 2 (two) times daily. 180 tablet 0  . levonorgestrel (MIRENA, 52 MG,) 20 MCG/24HR IUD Inserted 08/04/2016 1 each 0  . QUEtiapine (SEROQUEL) 100 MG tablet Take 1 tablet (100 mg total) by mouth at bedtime. 90 tablet 0  . Rimegepant Sulfate (NURTEC) 75 MG TBDP Take 75 mg by mouth daily as needed. 8 tablet 6    Labs  Lab  Results:  Admission on 04/21/2020  Component Date Value Ref Range Status  . SARS Coronavirus 2 by RT PCR 04/21/2020 NEGATIVE  NEGATIVE Final   Comment: (NOTE) SARS-CoV-2 target nucleic acids are NOT DETECTED.  The SARS-CoV-2 RNA is generally detectable in upper respiratory specimens during the acute phase of infection. The lowest concentration of SARS-CoV-2 viral copies this assay can detect is 138 copies/mL. A negative result does not preclude SARS-Cov-2 infection and should not be used as the sole basis for treatment or other patient management decisions. A negative result may occur with  improper specimen collection/handling, submission of specimen other than nasopharyngeal swab, presence of viral mutation(s) within the areas targeted by this assay, and inadequate number of viral copies(<138 copies/mL). A negative result must be combined with clinical observations, patient history, and epidemiological information. The expected result is Negative.  Fact Sheet for Patients:  EntrepreneurPulse.com.au  Fact Sheet for Healthcare Providers:  IncredibleEmployment.be  This test is no                          t yet approved or cleared by the [place] FDA and  has been authorized for detection and/or diagnosis of SARS-CoV-2 by FDA under an Emergency Use Authorization (EUA). This EUA will remain  in effect (meaning this test can be used) for the duration of the COVID-19 declaration under Section 564(b)(1) of the Act, 21 U.S.C.section 360bbb-3(b)(1), unless the authorization is terminated  or revoked sooner.      . Influenza A by PCR 04/21/2020 NEGATIVE  NEGATIVE Final  . Influenza B by PCR 04/21/2020 NEGATIVE  NEGATIVE Final   Comment: (NOTE) The Xpert Xpress SARS-CoV-2/FLU/RSV plus assay is intended as an aid in the diagnosis of influenza  from Nasopharyngeal swab specimens and should not be used as a sole basis for treatment. Nasal washings  and aspirates are unacceptable for Xpert Xpress SARS-CoV-2/FLU/RSV testing.  Fact Sheet for Patients: EntrepreneurPulse.com.au  Fact Sheet for Healthcare Providers: IncredibleEmployment.be  This test is not yet approved or cleared by the [place] FDA and has been authorized for detection and/or diagnosis of SARS-CoV-2 by FDA under an Emergency Use Authorization (EUA). This EUA will remain in effect (meaning this test can be used) for the duration of the COVID-19 declaration under Section 564(b)(1) of the Act, 21 U.S.C. section 360bbb-3(b)(1), unless the authorization is terminated or revoked.  Performed at [facility], [place] [address] [place], [place] [address]   . SARS Coronavirus 2 Ag 04/21/2020 Negative  Negative Preliminary  . WBC 04/21/2020 10.5  4.0 - 10.5 K/uL Final  . RBC 04/21/2020 4.35  3.87 - 5.11 MIL/uL Final  . Hemoglobin 04/21/2020 13.3  12.0 - 15.0 g/dL Final  . HCT 04/21/2020 40.3  36 - 46 % Final  . MCV 04/21/2020 92.6  80.0 - 100.0 fL Final  . MCH 04/21/2020 30.6  26.0 - 34.0 pg Final  . MCHC 04/21/2020 33.0  30.0 - 36.0 g/dL Final  . RDW 04/21/2020 12.4  11.5 - 15.5 % Final  . Platelets 04/21/2020 406* 150 - 400 K/uL Final  . nRBC 04/21/2020 0.0  0.0 - 0.2 % Final  . Neutrophils Relative % 04/21/2020 77  % Final  . Neutro Abs 04/21/2020 8.0* 1.7 - 7.7 K/uL Final  . Lymphocytes Relative 04/21/2020 15  % Final  . Lymphs Abs 04/21/2020 1.6  0.7 - 4.0 K/uL Final  . Monocytes Relative 04/21/2020 6  % Final  . Monocytes Absolute 04/21/2020 0.6  0.1 - 1.0 K/uL Final  . Eosinophils Relative 04/21/2020 1  % Final  . Eosinophils Absolute 04/21/2020 0.1  0.0 - 0.5 K/uL Final  . Basophils Relative 04/21/2020 1  % Final  . Basophils Absolute 04/21/2020 0.1  0.0 - 0.1 K/uL Final  . Immature Granulocytes 04/21/2020 0  % Final  . Abs Immature Granulocytes 04/21/2020 0.04  0.00 - 0.07 K/uL Final   Performed at [facility], [place] [address], [place], [place] [address]  . Sodium 04/21/2020 137  135 - 145 mmol/L Final  . Potassium 04/21/2020 3.8  3.5 - 5.1 mmol/L Final  . Chloride 04/21/2020 101  98 - 111 mmol/L Final  . CO2 04/21/2020 23  22 - 32 mmol/L Final  . Glucose, Bld 04/21/2020 86  70 - 99 mg/dL Final   Glucose reference range applies only to samples taken after fasting for at least 8 hours.  . BUN 04/21/2020 11  6 - 20 mg/dL Final  . Creatinine, Ser 04/21/2020 0.75  0.44 - 1.00 mg/dL Final  . Calcium 04/21/2020 9.4  8.9 - 10.3 mg/dL Final  . Total Protein 04/21/2020 6.4* 6.5 - 8.1 g/dL Final  . Albumin 04/21/2020 3.8  3.5 - 5.0 g/dL Final  . AST 04/21/2020 15  15 - 41 U/L Final  . ALT 04/21/2020 13  0 - 44 U/L Final  . Alkaline Phosphatase 04/21/2020 83  38 - 126 U/L Final  . Total Bilirubin 04/21/2020 0.6  0.3 - 1.2 mg/dL Final  . GFR, Estimated 04/21/2020 >60  >60 mL/min Final   Comment: (NOTE) Calculated using the CKD-EPI Creatinine Equation (2021)   . Anion gap 04/21/2020 13  5 - 15 Final   Performed at [facility], [place] [address], [place], [place] [address]  . Hgb A1c MFr Bld 04/21/2020 5.1  4.8 - 5.6 % Final   Comment: (NOTE) Pre diabetes:          5.7%-6.4%  Diabetes:              >6.4%  Glycemic control for   <7.0% adults with diabetes   . Mean Plasma Glucose 04/21/2020 99.67  mg/dL Final   Performed at [facility] [address], [place], [place] [address]  . Magnesium 04/21/2020 2.0  1.7 - 2.4 mg/dL Final   Performed at [facility] [address], [place], [place] [address]  . Alcohol, Ethyl (B) 04/21/2020 <10  <10 mg/dL Final   Comment: (NOTE) Lowest detectable limit for serum alcohol is 10 mg/dL.  For medical purposes only. Performed at [facility], [place] [address], [place], [place] [address]   . Cholesterol 04/21/2020 184  0 - 200 mg/dL Final  . Triglycerides 04/21/2020 52  <150 mg/dL Final  . HDL 04/21/2020 46  >40 mg/dL Final  .  Total CHOL/HDL Ratio 04/21/2020 4.0  RATIO Final  . VLDL 04/21/2020 10  0 - 40 mg/dL Final  . LDL Cholesterol 04/21/2020 128* 0 - 99 mg/dL Final   Comment:        Total Cholesterol/HDL:CHD Risk Coronary Heart Disease Risk Table                     Men   Women  1/2 Average Risk   3.4   3.3  Average Risk       5.0   4.4  2 X Average Risk   9.6   7.1  3 X Average Risk  23.4   11.0        Use the calculated Patient Ratio above and the CHD Risk Table to determine the patient's CHD Risk.        ATP III CLASSIFICATION (LDL):  <100     mg/dL   Optimal  100-129  mg/dL   Near or Above                    Optimal  130-159  mg/dL   Borderline  160-189  mg/dL   High  >190     mg/dL   Very High Performed at [facility] [address], [place], [place] [address]   . TSH 04/21/2020 2.469  0.350 - 4.500 uIU/mL Final   Comment: Performed by a 3rd Generation assay with a functional sensitivity of <=0.01 uIU/mL. Performed at [facility], [place] [address], [place], [place] [address]   . POC Amphetamine UR 04/21/2020 None Detected  None Detected Final  . POC Secobarbital (BAR) 04/21/2020 None Detected  None Detected Final  . POC Buprenorphine (BUP) 04/21/2020 None Detected  None Detected Final  . POC Oxazepam (BZO) 04/21/2020 Positive* None Detected Final  . POC Cocaine UR 04/21/2020 None Detected  None Detected Final  . POC Methamphetamine UR 04/21/2020 None Detected  None Detected Final  . POC Morphine 04/21/2020 Positive* None Detected Final  . POC Oxycodone UR 04/21/2020 None Detected  None Detected Final  . POC Methadone UR 04/21/2020 None Detected  None Detected Final  . POC Marijuana UR 04/21/2020 None Detected  None Detected Final  . Preg Test, Ur 04/21/2020 NEGATIVE  NEGATIVE Final   Comment:        THE SENSITIVITY OF  THIS METHODOLOGY IS >24 mIU/mL   . SARS Coronavirus 2 Ag 04/21/2020 NEGATIVE  NEGATIVE Final   Comment: (NOTE) SARS-CoV-2 antigen NOT DETECTED.   Negative  results are presumptive.  Negative results do not preclude SARS-CoV-2 infection and should not be used as the sole basis for treatment or other patient management decisions, including infection  control decisions, particularly in the presence of clinical signs and  symptoms consistent with COVID-19, or in those who have been in contact with the virus.  Negative results must be combined with clinical observations, patient history, and epidemiological information. The expected result is Negative.  Fact Sheet for Patients: PodPark.tn  Fact Sheet for Healthcare Providers: GiftContent.is   This test is not yet approved or cleared by the [place] FDA and  has been authorized for detection and/or diagnosis of SARS-CoV-2 by FDA under an Emergency Use Authorization (EUA).  This EUA will remain in effect (meaning this test can be used) for the duration of  the C                          OVID-19 declaration under Section 564(b)(1) of the Act, 21 U.S.C. section 360bbb-3(b)(1), unless the authorization is terminated or revoked sooner.    . Glucose-Capillary 04/21/2020 95  70 - 99 mg/dL Final   Glucose reference range applies only to samples taken after fasting for at least 8 hours.  . Bilirubin, Direct 04/21/2020 <0.1  0.0 - 0.2 mg/dL Final   Performed at [facility], [place] [address], [place], [place] [address]  Admission on 03/26/2020, Discharged on 03/27/2020  Component Date Value Ref Range Status  . SARS Coronavirus 2 by RT PCR 03/26/2020 NEGATIVE  NEGATIVE Final   Comment: (NOTE) SARS-CoV-2 target nucleic acids are NOT DETECTED.  The SARS-CoV-2 RNA is generally detectable in upper respiratoy specimens during the acute phase of infection. The lowest concentration of SARS-CoV-2 viral copies this assay can detect is 131 copies/mL. A negative result does not preclude SARS-Cov-2 infection and should not be used as the sole  basis for treatment or other patient management decisions. A negative result may occur with  improper specimen collection/handling, submission of specimen other than nasopharyngeal swab, presence of viral mutation(s) within the areas targeted by this assay, and inadequate number of viral copies (<131 copies/mL). A negative result must be combined with clinical observations, patient history, and epidemiological information. The expected result is Negative.  Fact Sheet for Patients:  PinkCheek.be  Fact Sheet for Healthcare Providers:  GravelBags.it  This test is no                          t yet approved or cleared by the [place] FDA and  has been authorized for detection and/or diagnosis of SARS-CoV-2 by FDA under an Emergency Use Authorization (EUA). This EUA will remain  in effect (meaning this test can be used) for the duration of the COVID-19 declaration under Section 564(b)(1) of the Act, 21 U.S.C. section 360bbb-3(b)(1), unless the authorization is terminated or revoked sooner.    . Influenza A by PCR 03/26/2020 NEGATIVE  NEGATIVE Final  . Influenza B by PCR 03/26/2020 NEGATIVE  NEGATIVE Final   Comment: (NOTE) The Xpert Xpress SARS-CoV-2/FLU/RSV assay is intended as an aid in  the diagnosis of influenza from Nasopharyngeal swab specimens and  should not be used as a sole basis for treatment. Nasal washings and  aspirates are  unacceptable for Xpert Xpress SARS-CoV-2/FLU/RSV  testing.  Fact Sheet for Patients: PinkCheek.be  Fact Sheet for Healthcare Providers: GravelBags.it  This test is not yet approved or cleared by the [place] FDA and  has been authorized for detection and/or diagnosis of SARS-CoV-2 by  FDA under an Emergency Use Authorization (EUA). This EUA will remain  in effect (meaning this test can be used) for the duration of the   Covid-19 declaration under Section 564(b)(1) of the Act, 21  U.S.C. section 360bbb-3(b)(1), unless the authorization is  terminated or revoked. Performed at [facility], [place] [address], [place], [place] [address]   . SARS Coronavirus 2 Ag 03/26/2020 Negative  Negative Final  . WBC 03/26/2020 13.4* 4.0 - 10.5 K/uL Final  . RBC 03/26/2020 4.63  3.87 - 5.11 MIL/uL Final  . Hemoglobin 03/26/2020 14.1  12.0 - 15.0 g/dL Final  . HCT 03/26/2020 42.4  36 - 46 % Final  . MCV 03/26/2020 91.6  80.0 - 100.0 fL Final  . MCH 03/26/2020 30.5  26.0 - 34.0 pg Final  . MCHC 03/26/2020 33.3  30.0 - 36.0 g/dL Final  . RDW 03/26/2020 12.6  11.5 - 15.5 % Final  . Platelets 03/26/2020 437* 150 - 400 K/uL Final  . nRBC 03/26/2020 0.0  0.0 - 0.2 % Final  . Neutrophils Relative % 03/26/2020 78  % Final  . Neutro Abs 03/26/2020 10.3* 1.7 - 7.7 K/uL Final  . Lymphocytes Relative 03/26/2020 14  % Final  . Lymphs Abs 03/26/2020 1.9  0.7 - 4.0 K/uL Final  . Monocytes Relative 03/26/2020 6  % Final  . Monocytes Absolute 03/26/2020 0.9  0.1 - 1.0 K/uL Final  . Eosinophils Relative 03/26/2020 1  % Final  . Eosinophils Absolute 03/26/2020 0.1  0.0 - 0.5 K/uL Final  . Basophils Relative 03/26/2020 1  % Final  . Basophils Absolute 03/26/2020 0.1  0.0 - 0.1 K/uL Final  . Immature Granulocytes 03/26/2020 0  % Final  . Abs Immature Granulocytes 03/26/2020 0.05  0.00 - 0.07 K/uL Final   Performed at [facility], [place] [address], [place], [place] [address]  . Sodium 03/26/2020 139  135 - 145 mmol/L Final  . Potassium 03/26/2020 3.6  3.5 - 5.1 mmol/L Final  . Chloride 03/26/2020 104  98 - 111 mmol/L Final  . CO2 03/26/2020 24  22 - 32 mmol/L Final  . Glucose, Bld 03/26/2020 86  70 - 99 mg/dL Final   Glucose reference range applies only to samples taken after fasting for at least 8 hours.  . BUN 03/26/2020 12  6 - 20 mg/dL Final  . Creatinine, Ser 03/26/2020 0.76  0.44 - 1.00 mg/dL Final  . Calcium  03/26/2020 9.3  8.9 - 10.3 mg/dL Final  . Total Protein 03/26/2020 6.7  6.5 - 8.1 g/dL Final  . Albumin 03/26/2020 4.1  3.5 - 5.0 g/dL Final  . AST 03/26/2020 16  15 - 41 U/L Final  . ALT 03/26/2020 15  0 - 44 U/L Final  . Alkaline Phosphatase 03/26/2020 77  38 - 126 U/L Final  . Total Bilirubin 03/26/2020 0.4  0.3 - 1.2 mg/dL Final  . GFR, Estimated 03/26/2020 >60  >60 mL/min Final   Comment: (NOTE) Calculated using the CKD-EPI Creatinine Equation (2021)   . Anion gap 03/26/2020 11  5 - 15 Final   Performed at [facility] [address] [place], [place] [address]  . Hgb A1c MFr  Bld 03/26/2020 5.1  4.8 - 5.6 % Final   Comment: (NOTE) Pre diabetes:          5.7%-6.4%  Diabetes:              >6.4%  Glycemic control for   <7.0% adults with diabetes   . Mean Plasma Glucose 03/26/2020 99.67  mg/dL Final   Performed at [facility] [address] [place], [place] [address]  . Alcohol, Ethyl (B) 03/26/2020 <10  <10 mg/dL Final   Comment: (NOTE) Lowest detectable limit for serum alcohol is 10 mg/dL.  For medical purposes only. Performed at [facility], [place] [address] [place], [place] [address]   . TSH 03/26/2020 1.565  0.350 - 4.500 uIU/mL Final   Comment: Performed by a 3rd Generation assay with a functional sensitivity of <=0.01 uIU/mL. Performed at [facility], [place] [address] [place], [place] [address]   . POC Amphetamine UR 03/26/2020 None Detected  None Detected Final  . POC Secobarbital (BAR) 03/26/2020 None Detected  None Detected Final  . POC Buprenorphine (BUP) 03/26/2020 None Detected  None Detected Final  . POC Oxazepam (BZO) 03/26/2020 None Detected  None Detected Final  . POC Cocaine UR 03/26/2020 None Detected  None Detected Final  . POC Methamphetamine UR 03/26/2020 None Detected  None Detected Final  . POC Morphine 03/26/2020 None Detected  None Detected Final  . POC Oxycodone UR 03/26/2020 None Detected  None Detected Final  . POC  Methadone UR 03/26/2020 None Detected  None Detected Final  . POC Marijuana UR 03/26/2020 None Detected  None Detected Final  . Cholesterol 03/26/2020 173  0 - 200 mg/dL Final  . Triglycerides 03/26/2020 134  <150 mg/dL Final  . HDL 03/26/2020 51  >40 mg/dL Final  . Total CHOL/HDL Ratio 03/26/2020 3.4  RATIO Final  . VLDL 03/26/2020 27  0 - 40 mg/dL Final  . LDL Cholesterol 03/26/2020 95  0 - 99 mg/dL Final   Comment:        Total Cholesterol/HDL:CHD Risk Coronary Heart Disease Risk Table                     Men   Women  1/2 Average Risk   3.4   3.3  Average Risk       5.0   4.4  2 X Average Risk   9.6   7.1  3 X Average Risk  23.4   11.0        Use the calculated Patient Ratio above and the CHD Risk Table to determine the patient's CHD Risk.        ATP III CLASSIFICATION (LDL):  <100     mg/dL   Optimal  100-129  mg/dL   Near or Above                    Optimal  130-159  mg/dL   Borderline  160-189  mg/dL   High  >190     mg/dL   Very High Performed at [facility] [address], [place], [place] [address]   . SARS Coronavirus 2 Ag 03/26/2020 NEGATIVE  NEGATIVE Final   Comment: (NOTE) SARS-CoV-2 antigen NOT DETECTED.   Negative results are presumptive.  Negative results do not preclude SARS-CoV-2 infection and should not be used as the sole basis for treatment or other patient management decisions, including infection  control decisions, particularly in the presence  of clinical signs and  symptoms consistent with COVID-19, or in those who have been in contact with the virus.  Negative results must be combined with clinical observations, patient history, and epidemiological information. The expected result is Negative.  Fact Sheet for Patients: PodPark.tn  Fact Sheet for Healthcare Providers: GiftContent.is   This test is not yet approved or cleared by the [place] FDA and  has been authorized for  detection and/or diagnosis of SARS-CoV-2 by FDA under an Emergency Use Authorization (EUA).  This EUA will remain in effect (meaning this test can be used) for the duration of  the C                          OVID-19 declaration under Section 564(b)(1) of the Act, 21 U.S.C. section 360bbb-3(b)(1), unless the authorization is terminated or revoked sooner.      Blood Alcohol level:  Lab Results  Component Value Date   ETH <10 04/21/2020   ETH <10 32/67/1245    Metabolic Disorder Labs: Lab Results  Component Value Date   HGBA1C 5.1 04/21/2020   MPG 99.67 04/21/2020   MPG 99.67 03/26/2020   Lab Results  Component Value Date   PROLACTIN 7.0 03/05/2015   Lab Results  Component Value Date   CHOL 184 04/21/2020   TRIG 52 04/21/2020   HDL 46 04/21/2020   CHOLHDL 4.0 04/21/2020   VLDL 10 04/21/2020   LDLCALC 128 (H) 04/21/2020   [place] 95 03/26/2020    Therapeutic Lab Levels: No results found for: LITHIUM No results found for: VALPROATE No components found for:  CBMZ  Physical Findings   GAD-7     Video Visit from 04/16/2020 in [place] Office Visit from 01/31/2020 in [place] Visit from 12/19/2019 in [place] Visit from 06/21/2018 in [place] Visit from 04/20/2018 in [place]  Total GAD-7 Score 21 16 21 10 16     PHQ2-9     ED from 04/21/2020 in [facility] Video Visit from 04/16/2020 in [place] Visit from 01/31/2020 in [place] Visit from 12/19/2019 in [place] Office Visit from 06/21/2018 in [place]  PHQ-2 Total Score 6 6 4 6 2   PHQ-9 Total Score 24 20 16 25 9         Musculoskeletal  Strength & Muscle Tone: within normal limits Gait & Station: normal Patient leans: N/A  Psychiatric Specialty Exam  Presentation  General Appearance: Appropriate for Environment;Casual  Eye Contact:Good  Speech:Clear and Coherent;Normal Rate  Speech Volume:Normal  Handedness:Right   Mood and Affect  Mood:Depressed  Affect:Appropriate;Depressed   Thought Process  Thought Processes:Coherent;Goal Directed  Descriptions of Associations:Intact  Orientation:Full (Time, Place and Person)  Thought Content:Logical  Hallucinations:Hallucinations: None  Ideas of Reference:None  Suicidal Thoughts:Suicidal Thoughts: Yes, Passive SI Active Intent and/or Plan: Without Intent  Homicidal Thoughts:Homicidal Thoughts: No   Sensorium  Memory:Immediate Good;Recent Good;Remote Good  Judgment:Fair  Insight:Fair   Executive Functions  Concentration:Fair  Attention Span:Fair  [place]   Psychomotor Activity  Psychomotor Activity:Psychomotor Activity: Normal   Assets  Assets:Communication Skills;Desire for Improvement;Financial Resources/Insurance;Intimacy;Housing;Leisure Time;Physical Health;Resilience;Social Support;Talents/Skills  Sleep  Sleep:Sleep: Fair   Physical Exam  Physical Exam Vitals and nursing note reviewed.  Constitutional:      Appearance: She is well-developed.  HENT:     Head: Normocephalic.  Cardiovascular:     Rate and Rhythm: Normal rate.  Pulmonary:     Effort: Pulmonary effort is normal.  Neurological:     Mental Status: She is alert and oriented to person, place, and time.  Psychiatric:        Attention and Perception: Attention and perception normal.        Mood and Affect: Affect normal. Mood is depressed.        Speech: Speech normal.        Behavior: Behavior normal. Behavior is cooperative.        Thought Content: Thought content includes suicidal ideation.         Cognition and Memory: Cognition and memory normal.        Judgment: Judgment normal.    Review of Systems  Constitutional: Negative.   HENT: Negative.   Eyes: Negative.   Respiratory: Negative.   Cardiovascular: Negative.   Gastrointestinal: Negative.   Genitourinary: Negative.   Musculoskeletal: Negative.   Skin: Negative.   Neurological: Negative.   Endo/Heme/Allergies: Negative.   Psychiatric/Behavioral: Positive for depression and suicidal ideas.   Blood pressure 95/63, pulse 85, temperature 97.7 F (36.5 C), temperature source Oral, resp. rate 18, height 5\' 4"  (1.626 m), weight 160 lb (72.6 kg), SpO2 98 %. Body mass index is 27.46 kg/m.  Treatment Plan Summary: Patient reviewed with Dr. Darleene Cleaver. Plan Inpatient psychiatric treatment  Patient has been accepted for voluntary inpatient admission at [facility] behavioral health.  Medications: -Fluoxetine 40 mg daily -Lamotrigine 100 mg twice daily -Seroquel 100 mg nightly  -Fluticasone 50 mg nasal spray 2 sprays daily   Emmaline Kluver, FNP 04/21/2020 1:46 PM

## 2020-04-21 NOTE — ED Notes (Signed)
Pt given breakfast.

## 2020-04-21 NOTE — ED Notes (Signed)
Patient resting in bed with eyes opened. Respirations even and non labored. Monitoring continues.

## 2020-04-21 NOTE — BH Assessment (Signed)
Comprehensive Clinical Assessment (CCA) Screening, Triage and Referral Note  04/21/2020 [name] [id number]   [name] is a 39 year old female presenting voluntarily to [facility] due to [place] with attempted overdose on last night. Patient disclosed that she was currently suicidal and that she took 10-15 Klonipins "handful of them on last night". Patient reported onset of SI was a few months ago when she attempted to hang herself on 03/25/20. Patient is reporting worsening depressive symptoms. Patient reported triggers/stressors include her ex-fiance cheated on her in their home with another woman in 10/2019. Patient was at [facility] for continual observation on 03/26/2020. Patient had another suicide attempt and hospitalization in 2005. Patient denied drug usage and reported occasional glass of wine, last drink was on yesterday. Patient reported 4-5 hours sleep at night and poor appetite, loosing approxiatemately 30lbs in 3 months.   Patient is currently prescribed psych medications Prozac and Lamictal by her PCP. Patient reported not receiving any outpatient mental health services in a "few years".   Patient reported she and her 83 year old daughter moving in with her parents after her suicide attempt on 03/25/20. Patient is currently employed at [employer] full-time.  Disposition Stacy Evert, NP, patient meets inpatient criteria. [name], AC, no available beds at this time. Patient will remain in continual observation tonight. Disposition SW will secure placement in the AM.  Chief Complaint:  Chief Complaint  Patient presents with  . Suicidal   Visit Diagnosis: Major depressive disorder  Patient Reported Information How did you hear about [place]? Family/Friend (Phreesia 04/21/2020)   Referral name: [name] [facility] 04/21/2020)   Referral phone number: No data recorded Whom do you see for routine medical problems? Primary Care (Phreesia 04/21/2020)   Practice/Facility Name: [facility] ([place] 04/21/2020)   Practice/Facility Phone Number: No data recorded  Name of Contact: Omer Monter ([place] 04/21/2020)   Contact Number: [phone] ([place] 04/21/2020)   Contact Fax Number: No data recorded  Prescriber Name: Philis Fendt [facility] 04/21/2020)   Prescriber Address (if known): [place] ([place] 04/21/2020)  What Is the Reason for Your Visit/Call Today? suicide Attempt (Phreesia 04/21/2020)  How Long Has This Been Causing You Problems? 1-6 months (Phreesia 04/21/2020)  Have You Recently Been in Any Inpatient Treatment (Hospital/Detox/Crisis Center/28-Day Program)? Yes (Phreesia 04/21/2020)   Name/Location of Program/Hospital:[facility] ([place] 04/21/2020)   How Long Were You There? 1 Day (Phreesia 04/21/2020)   When Were You Discharged? No data recorded Have You Ever Received Services From [facility] Before? Yes (Phreesia 04/21/2020)   Who Do You See at [facility]? Philis Fendt ([place] 04/21/2020)  Have You Recently Had Any Thoughts About Hurting Yourself? Yes (Phreesia 04/21/2020)   Are You Planning to Commit Suicide/Harm Yourself At This time?  Yes (Phreesia 04/21/2020)  Have you Recently Had Thoughts About [place]? No (Phreesia 04/21/2020)   Explanation: No data recorded Have You Used Any Alcohol or Drugs in the Past 24 Hours? No (Phreesia 04/21/2020)   How Long Ago Did You Use Drugs or Alcohol?  No data recorded  What Did You Use and How Much? 1 glass of wine  What Do You Feel Would Help You the Most Today? Other (Comment) (Phreesia 04/21/2020)  Do You Currently Have a Therapist/Psychiatrist? Yes ([place] 04/21/2020)   Name of Therapist/Psychiatrist: Daivd Council ([place] 04/21/2020)   Have You Been Recently Discharged From Any Office Practice or Programs? Yes (Phreesia 04/21/2020)   Explanation of  Discharge From Practice/Program:  Promise To Continue therapy  (Phreesia 04/21/2020)     CCA Screening Triage Referral Assessment Type of Contact: Face-to-Face   Is this Initial or Reassessment? No data recorded  Date Telepsych consult ordered in [facility]:  No data recorded  Time Telepsych consult ordered in [facility]:  No data recorded Patient Reported Information Reviewed? Yes   Patient Left Without Being Seen? No data recorded  Reason for Not Completing Assessment: No data recorded Collateral Involvement: Some collateral provided by patient's mother.  Does Patient Have a Stage manager Guardian? No data recorded  Name and Contact of Legal Guardian:  No data recorded If Minor and Not Living with Parent(s), Who has Custody? No data recorded Is CPS involved or ever been involved? Never  Is APS involved or ever been involved? Never  Patient Determined To Be At Risk for Harm To Self or Others Based on Review of Patient Reported Information or Presenting Complaint? Yes, for Self-Harm   Method: No data recorded  Availability of Means: No data recorded  Intent: No data recorded  Notification Required: No data recorded  Additional Information for Danger to Others Potential:  No data recorded  Additional Comments for Danger to Others Potential:  No data recorded  Are There Guns or Other Weapons in Your Home?  No data recorded   Types of Guns/Weapons: No data recorded   Are These Weapons Safely Secured?                              No data recorded   Who Could Verify You Are Able To Have These Secured:    No data recorded Do You Have any Outstanding Charges, Pending Court Dates, Parole/Probation? No data recorded Contacted To Inform of Risk of Harm To Self or Others: Family/Significant Other:  Location of Assessment: [facility]  Does Patient Present under Involuntary Commitment? No   IVC Papers Initial File Date: No data recorded  [place] of Residence: [place]  Patient Currently Receiving the Following Services: Not Receiving  Services   Determination of Need: Urgent (48 hours)   Options For Referral: [facility];Medication Management;Outpatient Therapy   [name], [facility]

## 2020-04-21 NOTE — ED Notes (Signed)
Meal given

## 2020-04-21 NOTE — ED Provider Notes (Addendum)
Behavioral Health Admission H&P [facility] & OBS)  Date: 04/21/20 Patient Name: [name] MRN: [id number] Chief Complaint:  Chief Complaint  Patient presents with  . Suicidal      Diagnoses: Bipolar 1 disorder, mixed, moderate (HCC) Borderline personality disorder ([place]) Adjustment disorder with mixed anxiety and depressed mood  HPI: The patient is [name], a 39 year old female who presented to the [facility] as a walk-in voluntarily and in the waiting area with her brother. The patient was last seen three weeks ago. She reports worsening depressive symptoms with a suicidal attempt last night and today by overdosing on a Klonopin suicide attempt the previous night. The patient says she has a significant psychiatric history of bipolar 1 disorder and borderline personality disorder. The patient reports that in June, she was engaged and found out that her ex-fianc was cheating on her and was having a woman in her house. She disclosed that she and her ex-fiance share a three-year-old daughter. She reports that she is being seen by family medicine provider and has her Prozac and Lamictal prescribed to her. She states that the medications were helping some but this is been too overwhelming for her. The patient is very emotional. She says that not having her daughter with her was a significant . She states that she does not want to die and wants to be there for her daughter. She says that she has not been to therapy in quite some time and that she has not seen a psychiatrist in several years. She reports that she had a previous suicide attempt in 2005 and was hospitalized then. She denies any illicit drug use or alcohol, except for occasional drink of alcohol which included 1 glass of wine yesterday. She reports that she is employed at [employer] full-time. She notes that her appetite has been decreased and has lost approximately 30 pounds in 3 months and her sleep is been very poor with an average of 4  to 5 hours at night, but it has been broken. She reports that her provider has her on Prozac 40 mg p.o. daily and Wellbutrin XL 150 mg p.o. daily however she was not seeing significant improvement and her doctor discontinued her from services. The patient is currently Wellbutrin XL and started her on Lamictal. She states that she has been on approximately 5 weeks and is increased to 200 mg a day.   PHQ 2-9:    ED from 04/21/2020 in [facility] Video Visit from 04/16/2020 in [place] Office Visit from 01/31/2020 in [place] that you would be better off dead, or of hurting yourself in some way Nearly every day  [Phreesia 04/21/2020] Nearly every day Not at all  PHQ-9 Total Score 24 20 16         ED from 04/21/2020 in [place] CATEGORY High Risk       Total Time spent with patient: 30 minutes  Musculoskeletal  Strength & Muscle Tone: within normal limits Gait & Station: normal Patient leans: N/A  Psychiatric Specialty Exam  Presentation General Appearance: [place] Contact:Poor  Speech:Clear and Coherent;Slow  Speech Volume:Decreased  Handedness:Right   Mood and Affect  Mood:Angry;Depressed;Labile  Affect:Blunt;Constricted;Depressed;Inappropriate   Thought Process  Thought Processes:Coherent  Descriptions of Associations:Intact  Orientation:Full (Time, Place and Person)  Thought Content:Logical  Hallucinations:Hallucinations: None  Ideas of Reference:None  Suicidal Thoughts:Suicidal Thoughts: Yes,  Active SI Active Intent and/or Plan: With Intent;With Access to Means;With Means to Carry Out;With Plan  Homicidal Thoughts:Homicidal Thoughts: No   Sensorium  Memory:Immediate Good;Recent Good;Remote Good  Judgment:Poor  Insight:Poor   Executive Functions  Concentration:Poor  Attention  Span:Poor  [place]   Psychomotor Activity  Psychomotor Activity:Psychomotor Activity: Normal   Assets  Assets:Communication Skills;Desire for Improvement;Social Support   Sleep  Sleep:Sleep: Fair   Physical Exam Vitals and nursing note reviewed.  Constitutional:      Appearance: Normal appearance. She is normal weight.  HENT:     Right Ear: External ear normal.     Left Ear: External ear normal.     Nose: Nose normal.     Mouth/Throat:     Mouth: Mucous membranes are dry.     Pharynx: Oropharynx is clear.  Cardiovascular:     Rate and Rhythm: Tachycardia present.     Pulses: Normal pulses.  Pulmonary:     Effort: Pulmonary effort is normal.     Breath sounds: Normal breath sounds.  Musculoskeletal:     Cervical back: Normal range of motion and neck supple.  Neurological:     Mental Status: She is alert.  Psychiatric:        Attention and Perception: Attention and perception normal.        Mood and Affect: Affect is blunt and flat.        Speech: Speech normal.        Behavior: Behavior is slowed. Behavior is cooperative.        Thought Content: Thought content includes suicidal ideation. Thought content includes suicidal plan.        Cognition and Memory: Cognition is impaired.        Judgment: Judgment is impulsive and inappropriate.    Review of Systems  Psychiatric/Behavioral: Positive for depression, hallucinations and suicidal ideas. The patient is nervous/anxious.   All other systems reviewed and are negative.   Blood pressure 101/70, pulse (!) 103, temperature 97.6 F (36.4 C), temperature source Temporal, resp. rate 18, height 5\' 4"  (1.626 m), weight 160 lb (72.6 kg), SpO2 99 %. Body mass index is 27.46 kg/m.  Past Psychiatric History:    Is the patient at risk to self? Yes  Has the patient been a risk to self in the past 6 months? Yes .    Has the patient been a risk to self within the distant past?  Yes   Is the patient a risk to others? No   Has the patient been a risk to others in the past 6 months? No   Has the patient been a risk to others within the distant past? No   Past Medical History:  Past Medical History:  Diagnosis Date  . Headache   . History of bipolar disorder   . History of pancreatitis   . Hx of varicella   . Migraines   . Vaginal Pap smear, abnormal     Past Surgical History:  Procedure Laterality Date  . CESAREAN SECTION N/A 06/04/2016   Procedure: CESAREAN SECTION;  Surgeon: Tyson Dense, MD;  Location: [facility];  Service: Obstetrics;  Laterality: N/A;  . MOUTH SURGERY     x 2, wisdom teeth, adjust teeth    Family History:  Family History  Problem Relation Age of Onset  . Stroke Father   . Heart attack Father   . Cancer Maternal Aunt  stomach  . Cancer Maternal Grandmother        ovarian  . Headache Mother   . Migraines Brother     Social History:  Social History   Socioeconomic History  . Marital status: Single    Spouse name: Not on file  . Number of children: 1  . Years of education: Not on file  . Highest education level: High school graduate  Occupational History  . Not on file  Tobacco Use  . Smoking status: Former Smoker    Packs/day: 0.50    Types: Cigarettes    Quit date: 09/16/2015    Years since quitting: 4.6  . Smokeless tobacco: Never Used  Vaping Use  . Vaping Use: Never used  Substance and Sexual Activity  . Alcohol use: Not Currently    Alcohol/week: 2.0 standard drinks    Types: 2 Standard drinks or equivalent per week  . Drug use: Never  . Sexual activity: Yes  Other Topics Concern  . Not on file  Social History Narrative   Lives with child   Caffeine- sodas 16 oz, 2 daily   Social Determinants of Health   Financial Resource Strain:   . Difficulty of Paying Living Expenses: Not on file  Food Insecurity:   . Worried About Charity fundraiser in the Last Year: Not on file  . Ran  Out of Food in the Last Year: Not on file  Transportation Needs:   . Lack of Transportation (Medical): Not on file  . Lack of Transportation (Non-Medical): Not on file  Physical Activity:   . Days of Exercise per Week: Not on file  . Minutes of Exercise per Session: Not on file  Stress:   . Feeling of Stress : Not on file  Social Connections:   . Frequency of Communication with Friends and Family: Not on file  . Frequency of Social Gatherings with Friends and Family: Not on file  . Attends Religious Services: Not on file  . Active Member of Clubs or Organizations: Not on file  . Attends Archivist Meetings: Not on file  . Marital Status: Not on file  Intimate Partner Violence:   . Fear of Current or Ex-Partner: Not on file  . Emotionally Abused: Not on file  . Physically Abused: Not on file  . Sexually Abused: Not on file    SDOH:  SDOH Screenings   Alcohol Screen:   . Last Alcohol Screening Score (AUDIT): Not on file  Depression (PHQ2-9): Medium Risk  . PHQ-2 Score: 24  Financial Resource Strain:   . Difficulty of Paying Living Expenses: Not on file  Food Insecurity:   . Worried About Charity fundraiser in the Last Year: Not on file  . Ran Out of Food in the Last Year: Not on file  Housing:   . Last Housing Risk Score: Not on file  Physical Activity:   . Days of Exercise per Week: Not on file  . Minutes of Exercise per Session: Not on file  Social Connections:   . Frequency of Communication with Friends and Family: Not on file  . Frequency of Social Gatherings with Friends and Family: Not on file  . Attends Religious Services: Not on file  . Active Member of Clubs or Organizations: Not on file  . Attends Archivist Meetings: Not on file  . Marital Status: Not on file  Stress:   . Feeling of Stress : Not on file  Tobacco Use:  Medium Risk  . Smoking Tobacco Use: Former Smoker  . Smokeless Tobacco Use: Never Used  Transportation Needs:   . Consulting civil engineer (Medical): Not on file  . Lack of Transportation (Non-Medical): Not on file    Last Labs:  Admission on 03/26/2020, Discharged on 03/27/2020  Component Date Value Ref Range Status  . SARS Coronavirus 2 by RT PCR 03/26/2020 NEGATIVE  NEGATIVE Final   Comment: (NOTE) SARS-CoV-2 target nucleic acids are NOT DETECTED.  The SARS-CoV-2 RNA is generally detectable in upper respiratoy specimens during the acute phase of infection. The lowest concentration of SARS-CoV-2 viral copies this assay can detect is 131 copies/mL. A negative result does not preclude SARS-Cov-2 infection and should not be used as the sole basis for treatment or other patient management decisions. A negative result may occur with  improper specimen collection/handling, submission of specimen other than nasopharyngeal swab, presence of viral mutation(s) within the areas targeted by this assay, and inadequate number of viral copies (<131 copies/mL). A negative result must be combined with clinical observations, patient history, and epidemiological information. The expected result is Negative.  Fact Sheet for Patients:  PinkCheek.be  Fact Sheet for Healthcare Providers:  GravelBags.it  This test is no                          t yet approved or cleared by the [place] FDA and  has been authorized for detection and/or diagnosis of SARS-CoV-2 by FDA under an Emergency Use Authorization (EUA). This EUA will remain  in effect (meaning this test can be used) for the duration of the COVID-19 declaration under Section 564(b)(1) of the Act, 21 U.S.C. section 360bbb-3(b)(1), unless the authorization is terminated or revoked sooner.    . Influenza A by PCR 03/26/2020 NEGATIVE  NEGATIVE Final  . Influenza B by PCR 03/26/2020 NEGATIVE  NEGATIVE Final   Comment: (NOTE) The Xpert Xpress SARS-CoV-2/FLU/RSV assay is intended as an aid in  the  diagnosis of influenza from Nasopharyngeal swab specimens and  should not be used as a sole basis for treatment. Nasal washings and  aspirates are unacceptable for Xpert Xpress SARS-CoV-2/FLU/RSV  testing.  Fact Sheet for Patients: PinkCheek.be  Fact Sheet for Healthcare Providers: GravelBags.it  This test is not yet approved or cleared by the [place] FDA and  has been authorized for detection and/or diagnosis of SARS-CoV-2 by  FDA under an Emergency Use Authorization (EUA). This EUA will remain  in effect (meaning this test can be used) for the duration of the  Covid-19 declaration under Section 564(b)(1) of the Act, 21  U.S.C. section 360bbb-3(b)(1), unless the authorization is  terminated or revoked. Performed at [facility], [place] [address], [place], [place] [address]   . SARS Coronavirus 2 Ag 03/26/2020 Negative  Negative Final  . WBC 03/26/2020 13.4* 4.0 - 10.5 K/uL Final  . RBC 03/26/2020 4.63  3.87 - 5.11 MIL/uL Final  . Hemoglobin 03/26/2020 14.1  12.0 - 15.0 g/dL Final  . HCT 03/26/2020 42.4  36 - 46 % Final  . MCV 03/26/2020 91.6  80.0 - 100.0 fL Final  . MCH 03/26/2020 30.5  26.0 - 34.0 pg Final  . MCHC 03/26/2020 33.3  30.0 - 36.0 g/dL Final  . RDW 03/26/2020 12.6  11.5 - 15.5 % Final  . Platelets 03/26/2020 437* 150 - 400 K/uL Final  . nRBC 03/26/2020 0.0  0.0 - 0.2 % Final  .  Neutrophils Relative % 03/26/2020 78  % Final  . Neutro Abs 03/26/2020 10.3* 1.7 - 7.7 K/uL Final  . Lymphocytes Relative 03/26/2020 14  % Final  . Lymphs Abs 03/26/2020 1.9  0.7 - 4.0 K/uL Final  . Monocytes Relative 03/26/2020 6  % Final  . Monocytes Absolute 03/26/2020 0.9  0.1 - 1.0 K/uL Final  . Eosinophils Relative 03/26/2020 1  % Final  . Eosinophils Absolute 03/26/2020 0.1  0.0 - 0.5 K/uL Final  . Basophils Relative 03/26/2020 1  % Final  . Basophils Absolute 03/26/2020 0.1  0.0 - 0.1 K/uL Final  . Immature  Granulocytes 03/26/2020 0  % Final  . Abs Immature Granulocytes 03/26/2020 0.05  0.00 - 0.07 K/uL Final   Performed at [facility], [place] [address] [place], [place] [address]  . Sodium 03/26/2020 139  135 - 145 mmol/L Final  . Potassium 03/26/2020 3.6  3.5 - 5.1 mmol/L Final  . Chloride 03/26/2020 104  98 - 111 mmol/L Final  . CO2 03/26/2020 24  22 - 32 mmol/L Final  . Glucose, Bld 03/26/2020 86  70 - 99 mg/dL Final   Glucose reference range applies only to samples taken after fasting for at least 8 hours.  . BUN 03/26/2020 12  6 - 20 mg/dL Final  . Creatinine, Ser 03/26/2020 0.76  0.44 - 1.00 mg/dL Final  . Calcium 03/26/2020 9.3  8.9 - 10.3 mg/dL Final  . Total Protein 03/26/2020 6.7  6.5 - 8.1 g/dL Final  . Albumin 03/26/2020 4.1  3.5 - 5.0 g/dL Final  . AST 03/26/2020 16  15 - 41 U/L Final  . ALT 03/26/2020 15  0 - 44 U/L Final  . Alkaline Phosphatase 03/26/2020 77  38 - 126 U/L Final  . Total Bilirubin 03/26/2020 0.4  0.3 - 1.2 mg/dL Final  . GFR, Estimated 03/26/2020 >60  >60 mL/min Final   Comment: (NOTE) Calculated using the CKD-EPI Creatinine Equation (2021)   . Anion gap 03/26/2020 11  5 - 15 Final   Performed at [facility] [address], [place], [place] [address]  . Hgb A1c MFr Bld 03/26/2020 5.1  4.8 - 5.6 % Final   Comment: (NOTE) Pre diabetes:          5.7%-6.4%  Diabetes:              >6.4%  Glycemic control for   <7.0% adults with diabetes   . Mean Plasma Glucose 03/26/2020 99.67  mg/dL Final   Performed at [facility] [address] [place], [place] [address]  . Alcohol, Ethyl (B) 03/26/2020 <10  <10 mg/dL Final   Comment: (NOTE) Lowest detectable limit for serum alcohol is 10 mg/dL.  For medical purposes only. Performed at [facility], [place] [address], [place], [place] [address]   . TSH 03/26/2020 1.565  0.350 - 4.500 uIU/mL Final   Comment: Performed by a 3rd Generation assay with a functional sensitivity of <=0.01  uIU/mL. Performed at [facility], [place] [address], [place], [place] [address]   . POC Amphetamine UR 03/26/2020 None Detected  None Detected Final  . POC Secobarbital (BAR) 03/26/2020 None Detected  None Detected Final  . POC Buprenorphine (BUP) 03/26/2020 None Detected  None Detected Final  . POC Oxazepam (BZO) 03/26/2020 None Detected  None Detected Final  . POC Cocaine UR 03/26/2020 None Detected  None Detected Final  . POC Methamphetamine UR 03/26/2020 None Detected  None Detected Final  . POC Morphine 03/26/2020 None Detected  None Detected Final  . POC Oxycodone UR 03/26/2020 None Detected  None Detected Final  . POC Methadone UR 03/26/2020 None Detected  None Detected Final  . POC Marijuana UR 03/26/2020 None Detected  None Detected Final  . Cholesterol 03/26/2020 173  0 - 200 mg/dL Final  . Triglycerides 03/26/2020 134  <150 mg/dL Final  . HDL 03/26/2020 51  >40 mg/dL Final  . Total CHOL/HDL Ratio 03/26/2020 3.4  RATIO Final  . VLDL 03/26/2020 27  0 - 40 mg/dL Final  . LDL Cholesterol 03/26/2020 95  0 - 99 mg/dL Final   Comment:        Total Cholesterol/HDL:CHD Risk Coronary Heart Disease Risk Table                     Men   Women  1/2 Average Risk   3.4   3.3  Average Risk       5.0   4.4  2 X Average Risk   9.6   7.1  3 X Average Risk  23.4   11.0        Use the calculated Patient Ratio above and the CHD Risk Table to determine the patient's CHD Risk.        ATP III CLASSIFICATION (LDL):  <100     mg/dL   Optimal  100-129  mg/dL   Near or Above                    Optimal  130-159  mg/dL   Borderline  160-189  mg/dL   High  >190     mg/dL   Very High Performed at [facility] [address], [place], [place] [address]   . SARS Coronavirus 2 Ag 03/26/2020 NEGATIVE  NEGATIVE Final   Comment: (NOTE) SARS-CoV-2 antigen NOT DETECTED.   Negative results are presumptive.  Negative results do not preclude SARS-CoV-2 infection and should not be used as the  sole basis for treatment or other patient management decisions, including infection  control decisions, particularly in the presence of clinical signs and  symptoms consistent with COVID-19, or in those who have been in contact with the virus.  Negative results must be combined with clinical observations, patient history, and epidemiological information. The expected result is Negative.  Fact Sheet for Patients: PodPark.tn  Fact Sheet for Healthcare Providers: GiftContent.is   This test is not yet approved or cleared by the [place] FDA and  has been authorized for detection and/or diagnosis of SARS-CoV-2 by FDA under an Emergency Use Authorization (EUA).  This EUA will remain in effect (meaning this test can be used) for the duration of  the C                          OVID-19 declaration under Section 564(b)(1) of the Act, 21 U.S.C. section 360bbb-3(b)(1), unless the authorization is terminated or revoked sooner.      Allergies: Codeine  PTA Medications: (Not in a hospital admission)   Medical Decision Making    Recommendations  Based on my evaluation the patient does not appear to have an emergency medical condition.  Caroline Sauger, NP 04/21/20  2:02 AM

## 2020-04-21 NOTE — ED Notes (Addendum)
PATIENT BELONGINGS ARE STORED IN IN [facility] # [phone]

## 2020-04-21 NOTE — ED Notes (Signed)
Patient is resting in bed with eyes closed. Respirations even and non labored. No distress noted. Monitoring continues.

## 2020-04-21 NOTE — ED Notes (Signed)
Skin search completed, monitoring for safety, no distress noted.  Pt calm & cooperative.

## 2020-04-21 NOTE — Progress Notes (Signed)
Per Cornelia Copa, NP [facility],   Pt has been accepted to [facility] bed 305-02.   Accepting provider is Dr. Mallie Darting.   Attending provider is Letitia Libra, FNP.   Patient can arrive AFTER 2200 HOURS TONIGHT (10pm).   Number for report is [phone].   Ardelle Anton, MSW, LCSW Licensed Holiday representative - PRN (Transition of Care Team) [facility]

## 2020-04-21 NOTE — ED Triage Notes (Signed)
Presents with suicidal attempt by taking 12 Klonopin x 24 hrs ago.  Pt sleepy at present.  Denies HI, auditory hallucinations noted, hears voices in her head.

## 2020-04-22 ENCOUNTER — Inpatient Hospital Stay (HOSPITAL_COMMUNITY)
Admission: AD | Admit: 2020-04-22 | Discharge: 2020-04-24 | DRG: 885 | Disposition: A | Discharge status: Home / Self Care | Payer: No Typology Code available for payment source | Source: Intra-hospital | Attending: Psychiatry | Admitting: Psychiatry

## 2020-04-22 ENCOUNTER — Other Ambulatory Visit: Payer: Self-pay

## 2020-04-22 ENCOUNTER — Encounter (HOSPITAL_COMMUNITY): Payer: Self-pay

## 2020-04-22 DIAGNOSIS — Z87891 Personal history of nicotine dependence: Secondary | ICD-10-CM

## 2020-04-22 DIAGNOSIS — Z639 Problem related to primary support group, unspecified: Secondary | ICD-10-CM

## 2020-04-22 DIAGNOSIS — F332 Major depressive disorder, recurrent severe without psychotic features: Secondary | ICD-10-CM | POA: Diagnosis present

## 2020-04-22 DIAGNOSIS — F411 Generalized anxiety disorder: Secondary | ICD-10-CM | POA: Diagnosis present

## 2020-04-22 DIAGNOSIS — F603 Borderline personality disorder: Secondary | ICD-10-CM | POA: Diagnosis present

## 2020-04-22 DIAGNOSIS — F319 Bipolar disorder, unspecified: Secondary | ICD-10-CM | POA: Diagnosis not present

## 2020-04-22 DIAGNOSIS — R45851 Suicidal ideations: Secondary | ICD-10-CM | POA: Diagnosis present

## 2020-04-22 DIAGNOSIS — F331 Major depressive disorder, recurrent, moderate: Secondary | ICD-10-CM

## 2020-04-22 DIAGNOSIS — F3162 Bipolar disorder, current episode mixed, moderate: Secondary | ICD-10-CM

## 2020-04-22 DIAGNOSIS — Z20822 Contact with and (suspected) exposure to covid-19: Secondary | ICD-10-CM | POA: Diagnosis present

## 2020-04-22 LAB — PROLACTIN: Prolactin: 26 ng/mL — ABNORMAL HIGH (ref 4.8–23.3)

## 2020-04-22 MED ORDER — ALUM & MAG HYDROXIDE-SIMETH 200-200-20 MG/5ML PO SUSP: 30.0000 mL | ORAL | Status: DC | PRN

## 2020-04-22 MED ORDER — LAMOTRIGINE 100 MG PO TABS
100.0000 mg | ORAL_TABLET | Freq: Two times a day (BID) | ORAL | Status: DC
  Administered 2020-04-22 – 2020-04-24 (×5): 100 mg via ORAL
  Filled 2020-04-22 (×9): qty 1

## 2020-04-22 MED ORDER — ENSURE ENLIVE PO LIQD
237.0000 mL | Freq: Two times a day (BID) | ORAL | Status: DC
  Filled 2020-04-22 (×9): qty 237

## 2020-04-22 MED ORDER — QUETIAPINE FUMARATE 100 MG PO TABS
100.0000 mg | ORAL_TABLET | Freq: Every day | ORAL | Status: DC
  Administered 2020-04-22: 100 mg via ORAL
  Filled 2020-04-22 (×3): qty 1

## 2020-04-22 MED ORDER — FLUOXETINE HCL 20 MG PO CAPS
40.0000 mg | ORAL_CAPSULE | Freq: Every day | ORAL | Status: DC
  Administered 2020-04-22 – 2020-04-24 (×3): 40 mg via ORAL
  Filled 2020-04-22 (×5): qty 2

## 2020-04-22 MED ORDER — MAGNESIUM HYDROXIDE 400 MG/5ML PO SUSP: 30.0000 mL | Freq: Every day | ORAL | Status: DC | PRN

## 2020-04-22 MED ORDER — ACETAMINOPHEN 325 MG PO TABS
650.0000 mg | ORAL_TABLET | Freq: Four times a day (QID) | ORAL | Status: DC | PRN
  Administered 2020-04-23 (×2): 650 mg via ORAL
  Filled 2020-04-22 (×2): qty 2

## 2020-04-22 MED ORDER — HYDROXYZINE HCL 25 MG PO TABS
25.0000 mg | ORAL_TABLET | Freq: Three times a day (TID) | ORAL | Status: DC | PRN
  Administered 2020-04-23: 25 mg via ORAL
  Filled 2020-04-22: qty 1
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
## 2020-04-22 NOTE — Plan of Care (Signed)
  Problem: Safety: Goal: Ability to disclose and discuss suicidal ideas will improve Outcome: Progressing
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
## 2020-04-22 NOTE — Progress Notes (Signed)
NUTRITION ASSESSMENT  Pt identified as at risk on the Malnutrition Screen Tool  INTERVENTION: 1. Supplements: Ensure Plus po BID, each supplement provides 350 kcal and 13 grams of protein  NUTRITION DIAGNOSIS: Unintentional weight loss related to sub-optimal intake as evidenced by pt report.   Goal: Pt to meet >/= 90% of their estimated nutrition needs.  Monitor:  PO intake  Assessment:  Pt admitted for depression and SI. Pt reports fair appetite. Per weight records, pt has lost 28 lbs since 5/26 (14% wt loss x  6.5 months, significant for time frame). Will order Ensure supplements.  Height: Ht Readings from Last 1 Encounters:  04/22/20 5\' 4"  (1.626 m)    Weight: Wt Readings from Last 1 Encounters:  04/22/20 74.4 kg    Weight Hx: Wt Readings from Last 10 Encounters:  04/22/20 74.4 kg  04/21/20 72.6 kg  04/16/20 72.6 kg  03/26/20 72.6 kg  01/31/20 76.2 kg  12/19/19 81.6 kg  10/24/19 86.5 kg  10/11/19 87.5 kg  09/12/18 75.8 kg  06/21/18 85.3 kg    BMI:  Body mass index is 28.15 kg/m. Pt meets criteria for overweight based on current BMI.  Estimated Nutritional Needs: Kcal: 25-30 kcal/kg Protein: > 1 gram protein/kg Fluid: 1 ml/kcal  Diet Order:  Diet Order            Diet regular Room service appropriate? Yes; Fluid consistency: Thin  Diet effective now                Pt is also offered choice of unit snacks mid-morning and mid-afternoon.  Pt is eating as desired.   Lab results and medications reviewed.   Clayton Bibles, MS, RD, LDN Inpatient Clinical Dietitian Contact information available via Amion
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
## 2020-04-22 NOTE — Plan of Care (Signed)
Nurse discussed anxiety, depression and coping skills with patient.  
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
## 2020-04-22 NOTE — BHH Group Notes (Signed)
Occupational Therapy Group Note Date: 04/22/2020 Group Topic/Focus: Stress Management  Group Description: Group encouraged increased participation and engagement through discussion focused on topic of stress management. Patients engaged interactively to discuss components of stress including physical signs, emotional signs, negative management strategies, and positive management strategies. Each individual identified one new stress management strategy they would like to try moving forward.    Therapeutic Goals: Identify current stressors Identify healthy vs unhealthy stress management strategies/techniques Discuss and identify physical and emotional signs of stress Participation Level: Patient did not attend OT group session despite personal invitation.    Plan: Continue to engage patient in OT groups 2 - 3x/week.  04/22/2020  Ponciano Ort, MOT, OTR/L

## 2020-04-22 NOTE — ED Notes (Signed)
Report called to [place], [place], rm 305-2.  Comptroller.

## 2020-04-22 NOTE — BHH Group Notes (Signed)
[place] LCSW Group Therapy  04/22/2020 2:19 PM  Type of Therapy:  Group Therapy  Participation Level:  Did Not Attend  Summary of Progress/Problems: Pt did not attend.   [name] 04/22/2020, 2:19 PM

## 2020-04-22 NOTE — Progress Notes (Signed)
D:  Patient's self inventory sheet, patient sleeps good, no sleep medication given.  Good appetite, normal energy level, good concentration.  Rated depression, hopeless and anxiety #2.  Denied withdrawals.  Denied SI.  Denied physical problems.  Denied physical pain.  Goal is discharge.  Plans to return home.  Want to talk to a MD to discharge. A:  Medications administered per MD orders.  Emotional support and encouragement given patient. R:  Denied SI and HI, contracts for safety.  Denied A/V hallucinations.  Safety maintained with 15 minute checks.

## 2020-04-22 NOTE — Tx Team (Signed)
Initial Treatment Plan 04/22/2020 6:13 AM [name] GYI:[id number]    PATIENT STRESSORS: Loss of significant relationship Marital or family conflict Substance abuse Traumatic event   PATIENT STRENGTHS: Active sense of humor Average or above average intelligence Communication skills Physical Health Supportive family/friends Work skills   PATIENT IDENTIFIED PROBLEMS: Depression  Suicidal Ideation  Substance abuse   Anxiety      "Be able to live on my own again"         DISCHARGE CRITERIA:  Improved stabilization in mood, thinking, and/or behavior Motivation to continue treatment in a less acute level of care Need for constant or close observation no longer present Verbal commitment to aftercare and medication compliance  PRELIMINARY DISCHARGE PLAN: Outpatient therapy Return to previous living arrangement Return to previous work or school arrangements  PATIENT/FAMILY INVOLVEMENT: This treatment plan has been presented to and reviewed with the patient, [name].  The patient and family have been given the opportunity to ask questions and make suggestions.  Margaretann Loveless, RN 04/22/2020, 6:13 AM

## 2020-04-22 NOTE — Progress Notes (Signed)
Admission Note  Pt is a 39 year old Caucasian female admitted to the services of Dr. Mallie Darting for depression and suicidal ideation related to breakup with fiance.  Pt had found that fiance was having an affair with another woman in their home.  The couple have a 65 year old child that they share custody of.  Pt had past suicide attempt in November on the 8th by hanging.  Pt has lived with her parents and her brother since that time so that she would not be staying alone.  Pt states that 12/4 she took an overdose of her 0.5mg  Klonopin (a handful).  Pt's brother brought her to the hospital upon learning of this.  Pt states that she is supposed to take one Klonopin as needed for anxiety but in reality she normally takes "3 or 4" at a time.  Pt does not appear to be exhibiting any s/s of withdrawal at this time.  Pt denies any other substance abuse.  Pt is cooperative with admission process.

## 2020-04-22 NOTE — Progress Notes (Signed)
   04/22/20 0603  Psych Admission Type (Psych Patients Only)  Admission Status Voluntary  Psychosocial Assessment  Patient Complaints Depression;Substance abuse  Eye Contact Fair  Facial Expression Flat  Affect Appropriate to circumstance  Speech Logical/coherent  Interaction Assertive  Motor Activity Other (Comment) (WDL)  Appearance/Hygiene Unremarkable  Behavior Characteristics Appropriate to situation  Mood Pleasant;Depressed  Thought Process  Coherency WDL  Content WDL  Delusions None reported or observed  Perception WDL  Hallucination None reported or observed  Judgment Poor  Confusion None  Danger to Self  Current suicidal ideation? Passive  Self-Injurious Behavior No self-injurious ideation or behavior indicators observed or expressed   Agreement Not to Harm Self Yes  Description of Agreement verbal  Danger to Others  Danger to Others None reported or observed

## 2020-04-22 NOTE — Progress Notes (Signed)
Patient presents pleasant and cooperative. Noted in room most of shift. Minimal interaction with staff or peers. Denies any SI, HI, AVH. Requested no prns. Patient anxious for discharge. Encouragement and support provided. Safety checks maintained. Medications given as prescribed. Pt receptive and remains safe on unit with q 15 min checks.

## 2020-04-22 NOTE — Progress Notes (Signed)
Psychoeducational Group Note  Date:  04/22/2020 Time:  2047  Group Topic/Focus:  Wrap-Up Group:   The focus of this group is to help patients review their daily goal of treatment and discuss progress on daily workbooks.  Participation Level: Did Not Attend  Participation Quality:  Not Applicable  Affect:  Not Applicable  Cognitive:  Not Applicable  Insight:  Not Applicable  Engagement in Group: Not Applicable  Additional Comments:  The patient did not attend group.   Archie Balboa S 04/22/2020, 8:46 PM

## 2020-04-23 ENCOUNTER — Other Ambulatory Visit: Payer: Self-pay | Admitting: Physician Assistant

## 2020-04-23 DIAGNOSIS — F603 Borderline personality disorder: Secondary | ICD-10-CM

## 2020-04-23 DIAGNOSIS — F411 Generalized anxiety disorder: Secondary | ICD-10-CM

## 2020-04-23 DIAGNOSIS — F3162 Bipolar disorder, current episode mixed, moderate: Secondary | ICD-10-CM

## 2020-04-23 DIAGNOSIS — Z639 Problem related to primary support group, unspecified: Secondary | ICD-10-CM

## 2020-04-23 DIAGNOSIS — F331 Major depressive disorder, recurrent, moderate: Secondary | ICD-10-CM

## 2020-04-23 MED ORDER — QUETIAPINE FUMARATE 50 MG PO TABS
150.0000 mg | ORAL_TABLET | Freq: Every day | ORAL | Status: DC
  Administered 2020-04-23: 150 mg via ORAL
  Filled 2020-04-23 (×3): qty 3

## 2020-04-23 NOTE — BHH Suicide Risk Assessment (Signed)
[facility] INPATIENT:  Family/Significant Other Suicide Prevention Education  Suicide Prevention Education:  Education Completed; [place] (brother) [phone],  (name of family member/significant other) has been identified by the patient as the family member/significant other with whom the patient will be residing, and identified as the person(s) who will aid the patient in the event of a mental health crisis (suicidal ideations/suicide attempt).  With written consent from the patient, the family member/significant other has been provided the following suicide prevention education, prior to the and/or following the discharge of the patient.  The suicide prevention education provided includes the following:  Suicide risk factors  Suicide prevention and interventions  National Suicide Hotline telephone number  [facility] assessment telephone number  [facility] Emergency Assistance [place] and/or Residential Mobile Crisis Unit telephone number  Request made of family/significant other to:  Remove weapons (e.g., guns, rifles, knives), all items previously/currently identified as safety concern.    Remove drugs/medications (over-the-counter, prescriptions, illicit drugs), all items previously/currently identified as a safety concern.  The family member/significant other verbalizes understanding of the suicide prevention education information provided.  The family member/significant other agrees to remove the items of safety concern listed above.  Pt's brother stated that pt had a suicide attempt a few weeks ago and since then she has been living with their parents. Pt's parents went out of town and pt and her daughter were staying with her brother. On Friday night pt took 5 of her sleeping pills bc she felt like a burden to everyone and told her brother about this on Saturday. Pt then disappeared all day Saturday. Brother confronted her about being gone all day when she  returned to the home. Pt had a meltdown and wanted to leave house with daughter and began making suicidal comments. Pt will be staying with parents when she leaves. No weapons in parent' s home. Brother expresses no safety concerns for possible d/c tomorrow.    [name] 04/23/2020, 9:56 AM

## 2020-04-23 NOTE — Progress Notes (Signed)
   04/23/20 9741  Vital Signs  Pulse Rate 89  BP 109/76  BP Location Right Arm  BP Method Automatic  Patient Position (if appropriate) Standing   D: Patient denies SI/HI/AVH. Pt.rated anxiety 7/10 and depression 8/10. Pt. Was out in open areas and attended group. A:  Patient took scheduled medicine. Anxiety was relieved with 25 mg of Vistaril.  Support and encouragement provided Routine safety checks conducted every 15 minutes. Patient  Informed to notify staff with any concerns.  R: Safety maintained.

## 2020-04-23 NOTE — Progress Notes (Signed)
[facility] MD Progress Note  04/23/2020 1:02 PM [name]  MRN:  [id number] Subjective:  Patient reports that she is feeling much better today. Patient has been compliant with her medications but is still reluctant to attend group therapy. It was explained to patient that she does not have to actively participate; patient was relieved to hear this and she reported that she will consider attending and listening. Patient reports that she otherwise is feeling well on her medication and her appetite is good. Principal Problem: MDD (major depressive disorder), recurrent episode, severe ([place]) Diagnosis: Principal Problem:   MDD (major depressive disorder), recurrent episode, severe ([facility]) Active Problems:   Borderline personality disorder ([place])   Bipolar 1 disorder ([place])  Total Time spent with patient: 15 minutes  Past Psychiatric History: See H&P  Past Medical History:  Past Medical History:  Diagnosis Date  . Headache   . History of bipolar disorder   . History of pancreatitis   . Hx of varicella   . Migraines   . Vaginal Pap smear, abnormal     Past Surgical History:  Procedure Laterality Date  . CESAREAN SECTION N/A 06/04/2016   Procedure: CESAREAN SECTION;  Surgeon: Tyson Dense, MD;  Location: [facility];  Service: Obstetrics;  Laterality: N/A;  . MOUTH SURGERY     x 2, wisdom teeth, adjust teeth   Family History:  Family History  Problem Relation Age of Onset  . Stroke Father   . Heart attack Father   . Cancer Maternal Aunt        stomach  . Cancer Maternal Grandmother        ovarian  . Headache Mother   . Migraines Brother    Family Psychiatric  History: See H&P Social History:  Social History   Substance and Sexual Activity  Alcohol Use Not Currently  . Alcohol/week: 2.0 standard drinks  . Types: 2 Standard drinks or equivalent per week     Social History   Substance and Sexual Activity  Drug Use Never    Social History   Socioeconomic History   . Marital status: Single    Spouse name: Not on file  . Number of children: 1  . Years of education: Not on file  . Highest education level: High school graduate  Occupational History  . Not on file  Tobacco Use  . Smoking status: Former Smoker    Packs/day: 0.50    Types: Cigarettes    Quit date: 09/16/2015    Years since quitting: 4.6  . Smokeless tobacco: Never Used  Vaping Use  . Vaping Use: Never used  Substance and Sexual Activity  . Alcohol use: Not Currently    Alcohol/week: 2.0 standard drinks    Types: 2 Standard drinks or equivalent per week  . Drug use: Never  . Sexual activity: Yes  Other Topics Concern  . Not on file  Social History Narrative   Lives with child   Caffeine- sodas 16 oz, 2 daily   Social Determinants of Health   Financial Resource Strain:   . Difficulty of Paying Living Expenses: Not on file  Food Insecurity:   . Worried About Charity fundraiser in the Last Year: Not on file  . Ran Out of Food in the Last Year: Not on file  Transportation Needs:   . Lack of Transportation (Medical): Not on file  . Lack of Transportation (Non-Medical): Not on file  Physical Activity:   . Days of Exercise  per Week: Not on file  . Minutes of Exercise per Session: Not on file  Stress:   . Feeling of Stress : Not on file  Social Connections:   . Frequency of Communication with Friends and Family: Not on file  . Frequency of Social Gatherings with Friends and Family: Not on file  . Attends Religious Services: Not on file  . Active Member of Clubs or Organizations: Not on file  . Attends Archivist Meetings: Not on file  . Marital Status: Not on file   Additional Social History:                         Sleep: Fair  Appetite:  Good  Current Medications: Current Facility-Administered Medications  Medication Dose Route Frequency Provider Last Rate Last Admin  . acetaminophen (TYLENOL) tablet 650 mg  650 mg Oral Q6H PRN Emmaline Kluver,  FNP   650 mg at 04/23/20 7989  . alum & mag hydroxide-simeth (MAALOX/MYLANTA) 200-200-20 MG/5ML suspension 30 mL  30 mL Oral Q4H PRN Emmaline Kluver, FNP      . feeding supplement (ENSURE ENLIVE / ENSURE PLUS) liquid 237 mL  237 mL Oral BID BM Sharma Covert, MD      . FLUoxetine (PROZAC) capsule 40 mg  40 mg Oral Daily Emmaline Kluver, FNP   40 mg at 04/23/20 2119  . hydrOXYzine (ATARAX/VISTARIL) tablet 25 mg  25 mg Oral TID PRN Emmaline Kluver, FNP   25 mg at 04/23/20 0825  . lamoTRIgine (LAMICTAL) tablet 100 mg  100 mg Oral BID Emmaline Kluver, FNP   100 mg at 04/23/20 4174  . magnesium hydroxide (MILK OF MAGNESIA) suspension 30 mL  30 mL Oral Daily PRN Emmaline Kluver, FNP      . QUEtiapine (SEROQUEL) tablet 100 mg  100 mg Oral QHS Emmaline Kluver, FNP   100 mg at 04/22/20 2111    Lab Results: No results found for this or any previous visit (from the past 48 hour(s)).  Blood Alcohol level:  Lab Results  Component Value Date   ETH <10 04/21/2020   ETH <10 12/29/4816    Metabolic Disorder Labs: Lab Results  Component Value Date   HGBA1C 5.1 04/21/2020   MPG 99.67 04/21/2020   MPG 99.67 03/26/2020   Lab Results  Component Value Date   PROLACTIN 26.0 (H) 04/21/2020   PROLACTIN 7.0 03/05/2015   Lab Results  Component Value Date   CHOL 184 04/21/2020   TRIG 52 04/21/2020   HDL 46 04/21/2020   CHOLHDL 4.0 04/21/2020   VLDL 10 04/21/2020   LDLCALC 128 (H) 04/21/2020   LDLCALC 95 03/26/2020    Physical Findings: AIMS: Facial and Oral Movements Muscles of Facial Expression: None, normal Lips and Perioral Area: None, normal Jaw: None, normal Tongue: None, normal,Extremity Movements Upper (arms, wrists, hands, fingers): None, normal Lower (legs, knees, ankles, toes): None, normal, Trunk Movements Neck, shoulders, hips: None, normal, Overall Severity Severity of abnormal movements (highest score from questions above): None, normal Incapacitation due to abnormal movements: None,  normal Patient's awareness of abnormal movements (rate only patient's report): No Awareness, Dental Status Current problems with teeth and/or dentures?: No Does patient usually wear dentures?: No  CIWA:    COWS:     Musculoskeletal: Strength & Muscle Tone: within normal limits Gait & Station: normal Patient leans: N/A  Psychiatric Specialty Exam: Physical Exam Constitutional:  Appearance: Normal appearance.  HENT:     Head: Normocephalic and atraumatic.  Pulmonary:     Effort: Pulmonary effort is normal.  Neurological:     Mental Status: She is alert.     Review of Systems  Cardiovascular: Negative for chest pain.  Gastrointestinal: Negative for abdominal pain.  Musculoskeletal: Positive for back pain.    Blood pressure 109/76, pulse 89, temperature 97.8 F (36.6 C), temperature source Oral, resp. rate 18, height 5\' 4"  (1.626 m), weight 74.4 kg, last menstrual period 04/08/2020, SpO2 98 %.Body mass index is 28.15 kg/m.  General Appearance: Casual  Eye Contact:  Good  Speech:  Clear and Coherent  Volume:  Normal  Mood:  "better than yesterday"  Affect:  Appropriate  Thought Process:  Coherent  Orientation:  NA  Thought Content:  Logical  Suicidal Thoughts:  No  Homicidal Thoughts:  No  Memory:  NA  Judgement:  Fair  Insight:  Shallow  Psychomotor Activity:  Normal  Concentration:  Concentration: Fair  Recall:  NA  Fund of Knowledge:  Fair  Language:  Good  Akathisia:  No  Handed:  Right  AIMS (if indicated):     Assets:  Financial Resources/Insurance Housing Intimacy Leisure Time Social Support  ADL's:  Intact  Cognition:  WNL  Sleep:  Number of Hours: 6.25     Treatment Plan Summary: Daily contact with patient to assess and evaluate symptoms and progress in treatment  Mrs. [place] reports that she is feeling better today. She does not negate that she has been depressed recently but reports that she does like her medications at this time. Patient  is sleeping okay, but she is having some night time pain. Per patient report at admission patient took an excessive amount of klonopin "to help me sleep, I was feeling overwhelmed." Will increase patient Seroquel as she is seeing some improvement but could benefit more. Patient is also considering attending some groups to learn better coping mechanisms. On exam today patient reports that she will talk to her mother next time before she feels the need to attempt or she is feeling more overwhelmed. Patient also reports that she has been talking to friends and family on the phone and she realizes she does have a really good support system.  MDD, recurrent, severe vs Adjustment disorder with mixed anxiety and depressed mood GAD - Continue home medication Prozac 40mg  - Encourage groups - will discontinue klonopin  Hx of Bipolar 1 dx - Lamictal 100mg  BID, will also help to augment depressed mood  Insomnia - Increase Seroquel 150mg   Freida Busman, MD 04/23/2020, 1:02 PM

## 2020-04-23 NOTE — [facility] Suicide Risk Assessment (Signed)
[facility] INPATIENT:  Family/Significant Other Suicide Prevention Education  Suicide Prevention Education:  Contact Attempts:  Isidra Mings (brother) [phone], (name of family member/significant other) has been identified by the patient as the family member/significant other with whom the patient will be residing, and identified as the person(s) who will aid the patient in the event of a mental health crisis.  With written consent from the patient, two attempts were made to provide suicide prevention education, prior to and/or following the patient's discharge.  We were unsuccessful in providing suicide prevention education.  A suicide education pamphlet was given to the patient to share with family/significant other.  Date and time of first attempt:12/7 at 9:45am Date and time of second attempt:CSW will make another attempt.  [name] 04/23/2020, 9:45 AM

## 2020-04-23 NOTE — Progress Notes (Signed)
Recreation Therapy Notes  Animal-Assisted Activity (AAA) Program Checklist/Progress Notes Patient Eligibility Criteria Checklist & Daily Group note for Rec Tx Intervention  Date: 12.7.21 Time: 51 Location: [address]   AAA/T Program Assumption of Risk Form signed by Teacher, music or Parent Legal Guardian  YES  Patient is free of allergies or severe asthma YES  Patient reports no fear of animals YES  Patient reports no history of cruelty to animals YES   Patient understands his/her participation is voluntary  YES  Patient washes hands before animal contact  YES   Patient washes hands after animal contact  YES  Behavioral Response: Engaged  Education: Contractor, Appropriate Animal Interaction   Education Outcome: Acknowledges understanding/In group clarification offered/Needs additional education.   Clinical Observations/Feedback: Pt attended and participated in activity.     Victorino Sparrow, LRT/CTRS         Victorino Sparrow A 04/23/2020 3:57 PM

## 2020-04-23 NOTE — Progress Notes (Signed)
   04/23/20 2000  Psych Admission Type (Psych Patients Only)  Admission Status Voluntary  Psychosocial Assessment  Patient Complaints None  Eye Contact Fair  Facial Expression Flat  Affect Appropriate to circumstance  Speech Logical/coherent  Interaction Assertive  Motor Activity Other (Comment) (WDL)  Appearance/Hygiene Unremarkable  Behavior Characteristics Cooperative  Mood Anxious;Depressed  Thought Process  Coherency WDL  Content WDL  Delusions None reported or observed  Perception WDL  Hallucination None reported or observed  Judgment Poor  Confusion None  Danger to Self  Current suicidal ideation? Denies  Self-Injurious Behavior No self-injurious ideation or behavior indicators observed or expressed   Danger to Others  Danger to Others None reported or observed

## 2020-04-24 ENCOUNTER — Encounter: Payer: Self-pay | Admitting: Family Medicine

## 2020-04-24 ENCOUNTER — Ambulatory Visit: Payer: No Typology Code available for payment source | Admitting: Family Medicine

## 2020-04-24 DIAGNOSIS — F319 Bipolar disorder, unspecified: Secondary | ICD-10-CM

## 2020-04-24 MED ORDER — LAMOTRIGINE 100 MG PO TABS: 100.0000 mg | ORAL_TABLET | Freq: Two times a day (BID) | ORAL | 0 refills | Status: DC

## 2020-04-24 MED ORDER — QUETIAPINE FUMARATE 50 MG PO TABS: 150.0000 mg | ORAL_TABLET | Freq: Every day | ORAL | 0 refills | Status: DC

## 2020-04-24 MED ORDER — HYDROXYZINE HCL 25 MG PO TABS: 25.0000 mg | ORAL_TABLET | Freq: Three times a day (TID) | ORAL | 0 refills | Status: DC | PRN

## 2020-04-24 MED ORDER — FLUOXETINE HCL 40 MG PO CAPS: 40.0000 mg | ORAL_CAPSULE | Freq: Every day | ORAL | 0 refills | Status: DC

## 2020-04-24 NOTE — Progress Notes (Signed)
Psychoeducational Group Note  Date:  04/24/2020 Time:  1105  Group Topic/Focus:  Self Esteem Action Plan:   The focus of this group is to help patients create a plan to continue to build self-esteem after discharge.  Participation Level: Did Not Attend  Participation Quality:  Not Applicable  Affect:  Not Applicable  Cognitive:  Not Applicable  Insight:  Not Applicable  Engagement in Group: Not Applicable  Additional Comments:  Pt refused to attend this morning's group session stating: " I am leaving today so I don't need to attend, thank you."  Samia Kukla E 04/24/2020, 11:10 AM

## 2020-04-24 NOTE — Progress Notes (Addendum)
Discharge Note:  Patient discharged home with mother.  Suicide prevention information given and discussed with patient who stated she understood and had no questions.  Patient denied SI and HI.  Denied A/V hallucinations.  Patient stated she received all her belongings, clothing, toiletries, misc items, etc.  Patient stated she appreciated all assistance received from [facility] staff.  All required discharge information given to patient.

## 2020-04-24 NOTE — Patient Instructions (Incomplete)
Below is our plan:  We will ***  Please make sure you are staying well hydrated. I recommend 50-60 ounces daily. Well balanced diet and regular exercise encouraged.    Please continue follow up with care team as directed.   Follow up with *** in ***  You may receive a survey regarding today's visit. I encourage you to leave honest feed back as I do use this information to improve patient care. Thank you for seeing me today!      Migraine Headache A migraine headache is a very strong throbbing pain on one side or both sides of your head. This type of headache can also cause other symptoms. It can last from 4 hours to 3 days. Talk with your doctor about what things may bring on (trigger) this condition. What are the causes? The exact cause of this condition is not known. This condition may be triggered or caused by:  Drinking alcohol.  Smoking.  Taking medicines, such as: ? Medicine used to treat chest pain (nitroglycerin). ? Birth control pills. ? Estrogen. ? Some blood pressure medicines.  Eating or drinking certain products.  Doing physical activity. Other things that may trigger a migraine headache include:  Having a menstrual period.  Pregnancy.  Hunger.  Stress.  Not getting enough sleep or getting too much sleep.  Weather changes.  Tiredness (fatigue). What increases the risk?  Being 23-40 years old.  Being female.  Having a family history of migraine headaches.  Being Caucasian.  Having depression or anxiety.  Being very overweight. What are the signs or symptoms?  A throbbing pain. This pain may: ? Happen in any area of the head, such as on one side or both sides. ? Make it hard to do daily activities. ? Get worse with physical activity. ? Get worse around bright lights or loud noises.  Other symptoms may include: ? Feeling sick to your stomach (nauseous). ? Vomiting. ? Dizziness. ? Being sensitive to bright lights, loud noises, or  smells.  Before you get a migraine headache, you may get warning signs (an aura). An aura may include: ? Seeing flashing lights or having blind spots. ? Seeing bright spots, halos, or zigzag lines. ? Having tunnel vision or blurred vision. ? Having numbness or a tingling feeling. ? Having trouble talking. ? Having weak muscles.  Some people have symptoms after a migraine headache (postdromal phase), such as: ? Tiredness. ? Trouble thinking (concentrating). How is this treated?  Taking medicines that: ? Relieve pain. ? Relieve the feeling of being sick to your stomach. ? Prevent migraine headaches.  Treatment may also include: ? Having acupuncture. ? Avoiding foods that bring on migraine headaches. ? Learning ways to control your body functions (biofeedback). ? Therapy to help you know and deal with negative thoughts (cognitive behavioral therapy). Follow these instructions at home: Medicines  Take over-the-counter and prescription medicines only as told by your doctor.  Ask your doctor if the medicine prescribed to you: ? Requires you to avoid driving or using heavy machinery. ? Can cause trouble pooping (constipation). You may need to take these steps to prevent or treat trouble pooping:  Drink enough fluid to keep your pee (urine) pale yellow.  Take over-the-counter or prescription medicines.  Eat foods that are high in fiber. These include beans, whole grains, and fresh fruits and vegetables.  Limit foods that are high in fat and sugar. These include fried or sweet foods. Lifestyle  Do not drink alcohol.  Do  not use any products that contain nicotine or tobacco, such as cigarettes, e-cigarettes, and chewing tobacco. If you need help quitting, ask your doctor.  Get at least 8 hours of sleep every night.  Limit and deal with stress. General instructions      Keep a journal to find out what may bring on your migraine headaches. For example, write down: ? What  you eat and drink. ? How much sleep you get. ? Any change in what you eat or drink. ? Any change in your medicines.  If you have a migraine headache: ? Avoid things that make your symptoms worse, such as bright lights. ? It may help to lie down in a dark, quiet room. ? Do not drive or use heavy machinery. ? Ask your doctor what activities are safe for you.  Keep all follow-up visits as told by your doctor. This is important. Contact a doctor if:  You get a migraine headache that is different or worse than others you have had.  You have more than 15 headache days in one month. Get help right away if:  Your migraine headache gets very bad.  Your migraine headache lasts longer than 72 hours.  You have a fever.  You have a stiff neck.  You have trouble seeing.  Your muscles feel weak or like you cannot control them.  You start to lose your balance a lot.  You start to have trouble walking.  You pass out (faint).  You have a seizure. Summary  A migraine headache is a very strong throbbing pain on one side or both sides of your head. These headaches can also cause other symptoms.  This condition may be treated with medicines and changes to your lifestyle.  Keep a journal to find out what may bring on your migraine headaches.  Contact a doctor if you get a migraine headache that is different or worse than others you have had.  Contact your doctor if you have more than 15 headache days in a month. This information is not intended to replace advice given to you by your health care provider. Make sure you discuss any questions you have with your health care provider. Document Revised: 08/26/2018 Document Reviewed: 06/16/2018 Elsevier Patient Education  [place].
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
## 2020-04-24 NOTE — Progress Notes (Signed)
  [facility] Adult Case Management Discharge Plan :  Will you be returning to the same living situation after discharge:  Yes,  with parents At discharge, do you have transportation home?: Yes,  via mother.  Do you have the ability to pay for your medications: Yes,  pt has insurance  Release of information consent forms completed and in the chart;  Patient's signature needed at discharge.  Patient to Follow up at:  Follow-up Information    [facility] Follow up.   Why: Continue with your current therapist. Contact information: Web address:  [email or web address]       [facility] Follow up on 05/24/2020.   Why: You have an appointment for medication management services on 05/24/20 at 5:40 pm.  This will be a Virtual appointment. Contact information: [place] [address] [phone]               Next level of care provider has access to [place] and Suicide Prevention discussed: Yes,  Stephanine Reas (brother) [phone]  Have you used any form of tobacco in the last 30 days? (Cigarettes, Smokeless Tobacco, Cigars, and/or Pipes): No  Has patient been referred to the Quitline?: Patient refused referral  Patient has been referred for addiction treatment: N/A  [name], LCSWA 04/24/2020, 9:20 AM

## 2020-04-24 NOTE — BHH Suicide Risk Assessment (Signed)
[facility] Discharge Suicide Risk Assessment   Principal Problem: MDD (major depressive disorder), recurrent episode, severe ([place]) Discharge Diagnoses: Principal Problem:   MDD (major depressive disorder), recurrent episode, severe ([place]) Active Problems:   Borderline personality disorder ([place])   Bipolar 1 disorder ([place])   Total Time spent with patient: 20 minutes  Musculoskeletal: Strength & Muscle Tone: within normal limits Gait & Station: normal Patient leans: N/A  Psychiatric Specialty Exam: Review of Systems  Blood pressure 99/70, pulse 86, temperature 98 F (36.7 C), temperature source Oral, resp. rate 18, height 5\' 4"  (1.626 m), weight 74.4 kg, last menstrual period 04/08/2020, SpO2 100 %.Body mass index is 28.15 kg/m.  General Appearance: Casual  Eye Contact::  Good  Speech:  Clear and Coherent  Volume:  Normal  Mood:  Euthymic  Affect:  Appropriate  Thought Process:  Coherent  Orientation:  Full (Time, Place, and Person)  Thought Content:  Logical  Suicidal Thoughts:  No  Homicidal Thoughts:  No  Memory:  Recent;   Fair  Judgement:  Fair  Insight:  Fair  Psychomotor Activity:  Normal  Concentration:  Fair  Recall:  [employer] [place]  Language: Fair  Akathisia:  No  Handed:  Right  AIMS (if indicated):     Assets:  Communication Skills Desire for Improvement Housing Leisure Time Physical Health Social Support  Sleep:  Number of Hours: 6.25  Cognition: WNL  ADL's:  Intact   Mental Status Per Nursing Assessment::   On Admission:  Suicidal ideation indicated by patient, Suicidal ideation indicated by others, Self-harm behaviors, Suicide plan, Self-harm thoughts  Demographic Factors:  Caucasian  Loss Factors: NA  Historical Factors: Impulsivity  Risk Reduction Factors:   Sense of responsibility to family, Positive social support and Positive therapeutic relationship  Continued Clinical Symptoms:  Previous Psychiatric Diagnoses and  Treatments  Cognitive Features That Contribute To Risk:  None    Suicide Risk:  Minimal: No identifiable suicidal ideation.  Patients presenting with no risk factors but with morbid ruminations; may be classified as minimal risk based on the severity of the depressive symptoms   Follow-up Information    [facility] Follow up.   Why: Continue with your current therapist. Contact information: Web address:  [email or web address]       [facility] Follow up on 05/24/2020.   Why: You have an appointment for medication management services on 05/24/20 at 5:40 pm.  This will be a Virtual appointment. Contact information: [place] [address] [phone]               Plan Of Care/Follow-up recommendations:  Other:  Follow-up with outpatient care  Dixie Dials, MD 04/24/2020, 9:50 AM

## 2020-04-24 NOTE — Progress Notes (Signed)
D:  Patient's self inventory sheet, patient sleeps good, no sleep medication.  Good appetite, normal energy level, good concentration.  Rated depression and anxiety 2, hopeless 1.  Denied withdrawals.  Denied SI.  Denied physical problems.  Denied physical pain.  Goal is discharge.  Goal is be proactive in goals, discharge, be positive, active in treatment.  "I'm really excited to leave so I can communicate with my therapist." A:  Medications administered per MD orders. Emotional support and encouragement given patient. R:  Denied SI and HI, contracts for safety.  Denied A/V hallucinations.  Safety maintained with 15 minute checks.

## 2020-04-24 NOTE — Progress Notes (Deleted)
No chief complaint on file.    HISTORY OF PRESENT ILLNESS: Today 04/24/20  [name] is a 39 y.o. female here today for follow up for migraines. She continues Ajovy and eletriptan.     HISTORY (copied from previous note)  39 year old female here for evaluation of headaches.  Around age 50 years old patient had onset of right greater than left-sided piercing severe blinding pain associate with nausea, photophobia and phonophobia.  Sometimes she would see spots and sparkles and swelling movements.  Patient was diagnosed with migraine with aura.  She is tried Trokendi, Maxalt and over-the-counter medications recently without relief.  Medications helped initially but not anymore.  Now patient having headaches about 2-3 times per week.  In the past several weeks she is also had several episodes of bilateral upper extremity numbness weakness, rating to her neck and head.  She had MRI of the brain which was unremarkable.  Symptoms last about 10 to 15 minutes at a time.  1 time this was followed by a sharp headache on the right side.    REVIEW OF SYSTEMS: Out of a complete 14 system review of symptoms, the patient complains only of the following symptoms, and all other reviewed systems are negative.   ALLERGIES: Allergies  Allergen Reactions  . Codeine Swelling     HOME MEDICATIONS: Facility-Administered Medications Prior to Visit  Medication Dose Route Frequency Provider Last Rate Last Admin  . acetaminophen (TYLENOL) tablet 650 mg  650 mg Oral Q6H PRN Emmaline Kluver, FNP   650 mg at 04/23/20 1648  . alum & mag hydroxide-simeth (MAALOX/MYLANTA) 200-200-20 MG/5ML suspension 30 mL  30 mL Oral Q4H PRN Emmaline Kluver, FNP      . feeding supplement (ENSURE ENLIVE / ENSURE PLUS) liquid 237 mL  237 mL Oral BID BM Sharma Covert, MD      . FLUoxetine (PROZAC) capsule 40 mg  40 mg Oral Daily Emmaline Kluver, FNP   40 mg at 04/24/20 0737  . hydrOXYzine (ATARAX/VISTARIL) tablet 25 mg  25  mg Oral TID PRN Emmaline Kluver, FNP   25 mg at 04/23/20 0825  . lamoTRIgine (LAMICTAL) tablet 100 mg  100 mg Oral BID Emmaline Kluver, FNP   100 mg at 04/24/20 0737  . magnesium hydroxide (MILK OF MAGNESIA) suspension 30 mL  30 mL Oral Daily PRN Emmaline Kluver, FNP      . QUEtiapine (SEROQUEL) tablet 150 mg  150 mg Oral QHS Damita Dunnings B, MD   150 mg at 04/23/20 2111   Outpatient Medications Prior to Visit  Medication Sig Dispense Refill  . clonazePAM (KLONOPIN) 0.5 MG tablet Take 1 tablet (0.5 mg total) by mouth 2 (two) times daily as needed for anxiety. 30 tablet 0  . FLUoxetine (PROZAC) 40 MG capsule Take 1 capsule (40 mg total) by mouth daily. For depression 30 capsule 0  . fluticasone (FLONASE) 50 MCG/ACT nasal spray Place into both nostrils daily as needed.     . Fremanezumab-vfrm (AJOVY) 225 MG/1.5ML SOAJ Inject 225 mg into the skin every 30 (thirty) days. 3 pen 4  . hydrOXYzine (ATARAX/VISTARIL) 25 MG tablet Take 1 tablet (25 mg total) by mouth 3 (three) times daily as needed for anxiety. 75 tablet 0  . lamoTRIgine (LAMICTAL) 100 MG tablet Take 1 tablet (100 mg total) by mouth 2 (two) times daily. For mood stabilization 60 tablet 0  . levonorgestrel (MIRENA, 52 MG,) 20 MCG/24HR IUD Inserted 08/04/2016  1 each 0  . QUEtiapine (SEROQUEL) 100 MG tablet Take 1 tablet (100 mg total) by mouth at bedtime. 90 tablet 0  . QUEtiapine (SEROQUEL) 50 MG tablet Take 3 tablets (150 mg total) by mouth at bedtime. For mood control 30 tablet 0  . Rimegepant Sulfate (NURTEC) 75 MG TBDP Take 75 mg by mouth daily as needed. 8 tablet 6     PAST MEDICAL HISTORY: Past Medical History:  Diagnosis Date  . Headache   . History of bipolar disorder   . History of pancreatitis   . Hx of varicella   . Migraines   . Vaginal Pap smear, abnormal      PAST SURGICAL HISTORY: Past Surgical History:  Procedure Laterality Date  . CESAREAN SECTION N/A 06/04/2016   Procedure: CESAREAN SECTION;  Surgeon: Tyson Dense, MD;  Location: [facility];  Service: Obstetrics;  Laterality: N/A;  . MOUTH SURGERY     x 2, wisdom teeth, adjust teeth     FAMILY HISTORY: Family History  Problem Relation Age of Onset  . Stroke Father   . Heart attack Father   . Cancer Maternal Aunt        stomach  . Cancer Maternal Grandmother        ovarian  . Headache Mother   . Migraines Brother      SOCIAL HISTORY: Social History   Socioeconomic History  . Marital status: Single    Spouse name: Not on file  . Number of children: 1  . Years of education: Not on file  . Highest education level: High school graduate  Occupational History  . Not on file  Tobacco Use  . Smoking status: Former Smoker    Packs/day: 0.50    Types: Cigarettes    Quit date: 09/16/2015    Years since quitting: 4.6  . Smokeless tobacco: Never Used  Vaping Use  . Vaping Use: Never used  Substance and Sexual Activity  . Alcohol use: Not Currently    Alcohol/week: 2.0 standard drinks    Types: 2 Standard drinks or equivalent per week  . Drug use: Never  . Sexual activity: Yes  Other Topics Concern  . Not on file  Social History Narrative   Lives with child   Caffeine- sodas 16 oz, 2 daily   Social Determinants of Health   Financial Resource Strain:   . Difficulty of Paying Living Expenses: Not on file  Food Insecurity:   . Worried About Charity fundraiser in the Last Year: Not on file  . Ran Out of Food in the Last Year: Not on file  Transportation Needs:   . Lack of Transportation (Medical): Not on file  . Lack of Transportation (Non-Medical): Not on file  Physical Activity:   . Days of Exercise per Week: Not on file  . Minutes of Exercise per Session: Not on file  Stress:   . Feeling of Stress : Not on file  Social Connections:   . Frequency of Communication with Friends and Family: Not on file  . Frequency of Social Gatherings with Friends and Family: Not on file  . Attends Religious Services: Not on file   . Active Member of Clubs or Organizations: Not on file  . Attends Archivist Meetings: Not on file  . Marital Status: Not on file  Intimate Partner Violence:   . Fear of Current or Ex-Partner: Not on file  . Emotionally Abused: Not on file  . Physically  Abused: Not on file  . Sexually Abused: Not on file      PHYSICAL EXAM  There were no vitals filed for this visit. There is no height or weight on file to calculate BMI.   Generalized: Well developed, in no acute distress  Cardiology: normal rate and rhythm, no murmur auscultated  Respiratory: clear to auscultation bilaterally    Neurological examination  Mentation: Alert oriented to time, place, history taking. Follows all commands speech and language fluent Cranial nerve II-XII: Pupils were equal round reactive to light. Extraocular movements were full, visual field were full on confrontational test. Facial sensation and strength were normal. Uvula tongue midline. Head turning and shoulder shrug  were normal and symmetric. Motor: The motor testing reveals 5 over 5 strength of all 4 extremities. Good symmetric motor tone is noted throughout.  Sensory: Sensory testing is intact to soft touch on all 4 extremities. No evidence of extinction is noted.  Coordination: Cerebellar testing reveals good finger-nose-finger and heel-to-shin bilaterally.  Gait and station: Gait is normal. Tandem gait is normal. Romberg is negative. No drift is seen.  Reflexes: Deep tendon reflexes are symmetric and normal bilaterally.     DIAGNOSTIC DATA (LABS, IMAGING, TESTING) - I reviewed patient records, labs, notes, testing and imaging myself where available.  Lab Results  Component Value Date   WBC 10.5 04/21/2020   HGB 13.3 04/21/2020   HCT 40.3 04/21/2020   MCV 92.6 04/21/2020   PLT 406 (H) 04/21/2020      Component Value Date/Time   NA 137 04/21/2020 0232   K 3.8 04/21/2020 0232   CL 101 04/21/2020 0232   CO2 23 04/21/2020  0232   GLUCOSE 86 04/21/2020 0232   BUN 11 04/21/2020 0232   CREATININE 0.75 04/21/2020 0232   CREATININE 0.74 06/21/2018 1348   CALCIUM 9.4 04/21/2020 0232   PROT 6.4 (L) 04/21/2020 0232   ALBUMIN 3.8 04/21/2020 0232   AST 15 04/21/2020 0232   ALT 13 04/21/2020 0232   ALKPHOS 83 04/21/2020 0232   BILITOT 0.6 04/21/2020 0232   GFRNONAA >60 04/21/2020 0232   GFRNONAA 103 06/21/2018 1348   GFRAA 119 06/21/2018 1348   Lab Results  Component Value Date   CHOL 184 04/21/2020   HDL 46 04/21/2020   LDLCALC 128 (H) 04/21/2020   TRIG 52 04/21/2020   CHOLHDL 4.0 04/21/2020   Lab Results  Component Value Date   HGBA1C 5.1 04/21/2020   No results found for: VITAMINB12 Lab Results  Component Value Date   TSH 2.469 04/21/2020      ASSESSMENT AND PLAN  39 y.o. year old female  has a past medical history of Headache, History of bipolar disorder, History of pancreatitis, varicella, Migraines, and Vaginal Pap smear, abnormal. here with ***  No diagnosis found.   I spent 20 minutes of face-to-face and non-face-to-face time with patient.  This included previsit chart review, lab review, study review, order entry, electronic health record documentation, patient education.    Debbora Presto, MSN, FNP-C 04/24/2020, 12:33 PM  [facility] [address] [place], [place] [address] [phone]

## 2020-04-25 ENCOUNTER — Encounter: Payer: Self-pay | Admitting: Physician Assistant

## 2020-04-26 ENCOUNTER — Encounter: Payer: Self-pay | Admitting: Physician Assistant

## 2020-04-26 ENCOUNTER — Other Ambulatory Visit: Payer: Self-pay | Admitting: Physician Assistant

## 2020-04-26 ENCOUNTER — Telehealth (INDEPENDENT_AMBULATORY_CARE_PROVIDER_SITE_OTHER): Payer: No Typology Code available for payment source | Admitting: Physician Assistant

## 2020-04-26 VITALS — BP 96/60 | Ht 64.0 in | Wt 164.0 lb

## 2020-04-26 DIAGNOSIS — F3162 Bipolar disorder, current episode mixed, moderate: Secondary | ICD-10-CM | POA: Diagnosis not present

## 2020-04-26 DIAGNOSIS — F603 Borderline personality disorder: Secondary | ICD-10-CM | POA: Diagnosis not present

## 2020-04-26 DIAGNOSIS — F332 Major depressive disorder, recurrent severe without psychotic features: Secondary | ICD-10-CM | POA: Diagnosis not present

## 2020-04-26 DIAGNOSIS — Z639 Problem related to primary support group, unspecified: Secondary | ICD-10-CM

## 2020-04-26 DIAGNOSIS — F411 Generalized anxiety disorder: Secondary | ICD-10-CM | POA: Diagnosis not present

## 2020-04-26 DIAGNOSIS — F331 Major depressive disorder, recurrent, moderate: Secondary | ICD-10-CM

## 2020-04-26 NOTE — Progress Notes (Deleted)
Patient went to behavioral health on 04/21/2020 after taking an overdose of pills.  She was on a voluntary admission 04/21/2020-04/24/2020 Seroquel increased to 150 mg  Was told to follow up with Luvenia Starch for referral to psychiatry Wants to take some time off work

## 2020-04-26 NOTE — Progress Notes (Signed)
Patient ID: [name], female   DOB: 03-30-1981, 39 y.o.   MRN: [id number] .[name] KitchenVirtual Visit via Video Note  I connected with [name] on 04/26/20 at  1:00 PM EST by a video enabled telemedicine application and verified that I am speaking with the correct person using two identifiers.  Location: Patient: home Provider: Follow-up after  .[name] KitchenParticipating in visit:  Patient: [name] Provider: Iran Planas PA-C Provider in training: Ola Spurr PA-Student    I discussed the limitations of evaluation and management by telemedicine and the availability of in person appointments. The patient expressed understanding and agreed to proceed.  History of Present Illness: Patient is a 39 year old female with bipolar 1, MDD, borderline personality disorder, generalized anxiety who presents to the clinic to follow up after last ED visit on 04/21/2020.  She was taken to the hospital after overdosing on Klonopin.  She reports she took 6 Klonopin not to hurt herself but to rest.  She was in the hospital from 04/21/2020 to 04/24/2020.  Her Seroquel was increased to 150 mg.  She was left on Lamictal and fluoxetine.  She was given Vistaril to use as needed for anxiety.  She is ready to go to a psychiatrist.  She is ready to go to outpatient intensive therapy.  She is going to need to be written out of work to go to the intensive therapy.  She denies any current thoughts of self harm or harm to others.  She is just " ready to get better".  .. Active Ambulatory Problems    Diagnosis Date Noted  . Left breast lump 03/05/2015  . Borderline personality disorder ([place]) 07/09/2015  . Generalized anxiety disorder 07/09/2015  . Bipolar 1 disorder, mixed, moderate ([place]) 07/09/2015  . Iron deficiency anemia 03/20/2016  . Encounter for insertion of intrauterine contraceptive device (IUD) 08/05/2016  . 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 Nevus 03/15/2017  . 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 Worsening headaches  10/13/2019  . Paresthesia 10/13/2019  . Upper extremity weakness 10/13/2019  . Other symptoms and signs involving the nervous system 10/13/2019  . Relationship problems 01/31/2020  . Bipolar 1 disorder ([place]) 04/22/2020  . MDD (major depressive disorder), recurrent episode, severe ([place]) 04/22/2020   Resolved Ambulatory Problems    Diagnosis Date Noted  . Nipple discharge 03/05/2015  . Depression 07/09/2015  . [redacted] weeks gestation of pregnancy 03/20/2016  . Normal pregnancy 06/03/2016  . Class 1 obesity due to excess calories without serious comorbidity with body mass index (BMI) of 32.0 to 32.9 in adult 04/20/2018   Past Medical History:  Diagnosis Date  . Headache   . History of bipolar disorder   . History of pancreatitis   . Hx of varicella   . Migraines   . Vaginal Pap smear, abnormal    Reviewed med, allergy, problem list.    Observations/Objective: No acute distress Very flat affect.   .. Today's Vitals   04/26/20 1252  BP: 96/60  Weight: 164 lb (74.4 kg)  Height: 5\' 4"  (1.626 m)   Body mass index is 28.15 kg/m.    Assessment and Plan: [name] KitchenMarland KitchenDelsie was seen today for depression.  Diagnoses and all orders for this visit:  Bipolar 1 disorder, mixed, moderate ([place]) -     Ambulatory referral to Psychiatry  Borderline personality disorder [facility]) -     Ambulatory referral to Psychiatry  Severe episode of recurrent major depressive disorder, without psychotic features ([place]) -     Ambulatory referral to Psychiatry  Generalized anxiety disorder -     Ambulatory referral to Psychiatry   Referred to [facility].  Pt already has appt for outpatient intensive counseling 9-1 for 8 weeks.  Pt needs this.  Written out of work for 10 weeks to complete outpatient intensive program.  No more klonapin.     Follow Up Instructions:    I discussed the assessment and treatment plan with the patient. The patient was provided an opportunity to ask questions and all were answered. The  patient agreed with the plan and demonstrated an understanding of the instructions.   The patient was advised to call back or seek an in-person evaluation if the symptoms worsen or if the condition fails to improve as anticipated.    Iran Planas, PA-C

## 2020-04-30 ENCOUNTER — Encounter: Payer: Self-pay | Admitting: Physician Assistant

## 2020-04-30 ENCOUNTER — Other Ambulatory Visit: Payer: Self-pay | Admitting: Physician Assistant

## 2020-04-30 DIAGNOSIS — F411 Generalized anxiety disorder: Secondary | ICD-10-CM

## 2020-04-30 DIAGNOSIS — F603 Borderline personality disorder: Secondary | ICD-10-CM

## 2020-04-30 DIAGNOSIS — F3162 Bipolar disorder, current episode mixed, moderate: Secondary | ICD-10-CM

## 2020-04-30 DIAGNOSIS — F331 Major depressive disorder, recurrent, moderate: Secondary | ICD-10-CM

## 2020-04-30 DIAGNOSIS — Z639 Problem related to primary support group, unspecified: Secondary | ICD-10-CM

## 2020-04-30 MED ORDER — FLUOXETINE HCL 40 MG PO CAPS: 40.0000 mg | ORAL_CAPSULE | Freq: Every day | ORAL | 0 refills | Status: DC

## 2020-04-30 NOTE — Telephone Encounter (Signed)
Pt was referred to outpatient intensive therapy but she is having problems. Can you help her cindy?

## 2020-05-06 ENCOUNTER — Telehealth (HOSPITAL_COMMUNITY): Payer: Self-pay | Admitting: Physician Assistant

## 2020-05-06 ENCOUNTER — Telehealth (HOSPITAL_COMMUNITY): Payer: Self-pay | Admitting: Licensed Clinical Social Worker

## 2020-05-06 NOTE — Telephone Encounter (Signed)
Care Management - Follow Up [facility] Discharges   Writer made contact with the patient.  Patient reports that she has not made an appointment with a psychiatrist or therapist because she wants to participate in the [facility] program.    Writer sent notification to the [facility] intake worker.

## 2020-05-16 ENCOUNTER — Other Ambulatory Visit: Payer: Self-pay

## 2020-05-16 ENCOUNTER — Other Ambulatory Visit (HOSPITAL_COMMUNITY)
Payer: No Typology Code available for payment source | Attending: Psychiatry | Admitting: Licensed Clinical Social Worker

## 2020-05-16 DIAGNOSIS — F603 Borderline personality disorder: Secondary | ICD-10-CM

## 2020-05-16 DIAGNOSIS — F332 Major depressive disorder, recurrent severe without psychotic features: Secondary | ICD-10-CM

## 2020-05-20 ENCOUNTER — Other Ambulatory Visit (HOSPITAL_COMMUNITY): Payer: No Typology Code available for payment source | Attending: Psychiatry | Admitting: Occupational Therapy

## 2020-05-20 ENCOUNTER — Other Ambulatory Visit (HOSPITAL_COMMUNITY): Payer: No Typology Code available for payment source | Admitting: Licensed Clinical Social Worker

## 2020-05-20 ENCOUNTER — Other Ambulatory Visit: Payer: Self-pay

## 2020-05-20 ENCOUNTER — Encounter (HOSPITAL_COMMUNITY): Payer: Self-pay

## 2020-05-20 DIAGNOSIS — Z79899 Other long term (current) drug therapy: Secondary | ICD-10-CM | POA: Diagnosis not present

## 2020-05-20 DIAGNOSIS — R4589 Other symptoms and signs involving emotional state: Secondary | ICD-10-CM

## 2020-05-20 DIAGNOSIS — Z87891 Personal history of nicotine dependence: Secondary | ICD-10-CM | POA: Insufficient documentation

## 2020-05-20 DIAGNOSIS — F32A Depression, unspecified: Secondary | ICD-10-CM | POA: Diagnosis present

## 2020-05-20 DIAGNOSIS — R41844 Frontal lobe and executive function deficit: Secondary | ICD-10-CM

## 2020-05-20 DIAGNOSIS — F603 Borderline personality disorder: Secondary | ICD-10-CM

## 2020-05-20 DIAGNOSIS — F3162 Bipolar disorder, current episode mixed, moderate: Secondary | ICD-10-CM | POA: Diagnosis not present

## 2020-05-20 DIAGNOSIS — F411 Generalized anxiety disorder: Secondary | ICD-10-CM | POA: Insufficient documentation

## 2020-05-20 DIAGNOSIS — F332 Major depressive disorder, recurrent severe without psychotic features: Secondary | ICD-10-CM
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
## 2020-05-20 NOTE — Therapy (Signed)
[facility] PARTIAL HOSPITALIZATION PROGRAM [address] [place], [place], [address] Phone: [phone]   Fax:  [phone] Virtual Visit via Video Note  I connected with [name] on 05/20/20 at  11:00 AM EST by a video enabled telemedicine application and verified that I am speaking with the correct person using two identifiers.  Location: Patient: Patient Home Provider: Clinic Office    I discussed the limitations of evaluation and management by telemedicine and the availability of in person appointments. The patient expressed understanding and agreed to proceed.   I discussed the assessment and treatment plan with the patient. The patient was provided an opportunity to ask questions and all were answered. The patient agreed with the plan and demonstrated an understanding of the instructions.   The patient was advised to call back or seek an in-person evaluation if the symptoms worsen or if the condition fails to improve as anticipated.  I provided 85 minutes of non-face-to-face time during this encounter. 60 minutes OT Group Therapy 25 minutes OT Evaluation   Donne Hazel, OT   Occupational Therapy Evaluation  Patient Details  Name: [name] MRN: [id number] Date of Birth: January 02, 1981 Referring Provider (OT): Hillery Jacks   Encounter Date: 05/20/2020   OT End of Session - 05/20/20 1217    Visit Number 1    Number of Visits 20    Date for OT Re-Evaluation 06/17/20    Authorization Type [employer] Preferred    OT Start Time 1105   OT Eval [id number]   OT Stop Time 1205    OT Time Calculation (min) 60 min    Activity Tolerance Patient tolerated treatment well    Behavior During Therapy [facility] for tasks assessed/performed           Past Medical History:  Diagnosis Date  . Headache   . History of bipolar disorder   . History of pancreatitis   . Hx of varicella   . Migraines   . Vaginal Pap smear, abnormal     Past Surgical  History:  Procedure Laterality Date  . CESAREAN SECTION N/A 06/04/2016   Procedure: CESAREAN SECTION;  Surgeon: Ranae Pila, MD;  Location: [facility] SU[facility];  Service: Obstetrics;  Laterality: N/A;  . MOUTH SURGERY     x 2, wisdom teeth, adjust teeth    There were no vitals filed for this visit.   Subjective Assessment - 05/20/20 1216    Currently in Pain? No/denies    Pain Score 0-No pain             [facility] OT Assessment - 05/20/20 0001      Assessment   Medical Diagnosis Bipolar I Disorder    Referring Provider (OT) Hillery Jacks      Precautions   Precautions None      Balance Screen   Has the patient fallen in the past 6 months No    Has the patient had a decrease in activity level because of a fear of falling?  No    Is the patient reluctant to leave their home because of a fear of falling?  No               OT Education - 05/20/20 1216    Education Details Educated on OT within [facility] programming, along with physical symptomology of stress and its effects on the body, along with positive stress management tips/strategies    Person(s) Educated Patient    Methods Explanation;Handout  Comprehension Verbalized understanding            OT Short Term Goals - 05/20/20 1222      OT SHORT TERM GOAL #1   Title Pt will actively engage in OT group sessions throughout duration of PHP programming, in order to promote daily structure, social engagement, and opportunities to develop and utilize adaptive strategies to maximize functional performance in preparation for safe transition and integration back into school, work, and the community.    Time 4    Period Weeks    Status New    Target Date 06/17/20      OT SHORT TERM GOAL #2   Title Pt will practice and identify 1-3 adaptive coping strategies she can utilize, in order to safely manage increased depression/anxiety, with min cues, in preparation for safe and healthy reintegration back into the community at  discharge.    Time 4    Period Weeks    Status New    Target Date 06/17/20      OT SHORT TERM GOAL #3   Title Pt will identify and implement 1-3 sleep hygiene strategies she can utilize, in order to improve sleep quality/ADL performance, in preparation for safe and healthy reintegration back into the community at discharge.    Time 4    Period Weeks    Status New    Target Date 06/17/20      OT SHORT TERM GOAL #4   Title Pt will identify and re-engage in 2-3 ADL/iADL routines, as it relates to re-engagement in household chores/duties, in order to promote re-establishment of daily routines in preparation for reintegration back into the community.    Time 4    Period Weeks    Status New    Target Date 06/17/20           Occupational Therapy Assessment 05/20/2020  [name] is a 40 y/o female with PMHx of borderline personality disorder and bipolar I who was referred to [facility] from [facility] after a recent inpatient hospitalization with reports of increased depression, anxiety, and suicidal thoughts. Pt reports several increased psychosocial stressors including recently finding out that her fianc of five years had been cheating on her for half of their relationship, learning how to navigate responsibilities as a single mom to her 54-year-old daughter, moving back in with her parents, while also managing increased sx related to her mental health. Pt reports a desire to engage in [place] programming in order to manage identified stressors and to engage meaningfully in identified areas of occupation and ADL/iADLs. Upon approach, pt presents as well-groomed, camera on, microphone working, and present virtually for duration of group and OT evaluation. Pt reports enjoying coloring, cross-stitch and reading and identifies goal for admission "learn how to manage everything that is going on and to stop obsessing over it all".   Precautions/Limitations: None noted  Cognition: Appears intact   Visual Motor: Pt denies  use of glasses/contacts/any other visual aids.   Living Situation: Pt recently moved back in with parents where she resides with her 51 y/o daughter  School/Work: Pt works on an hourly wage at [employer] - currently on leave of absence to manage mental health sx. (Of note, patient's ex-fiance is also employed here and is a Architect at another store location)  ADL/iADL Performance: Identified hygiene at a "7/10" and reports showering every 2-3 days. Pt reports wearing pajamas "all the time" and does not get dressed "because what is the point"   Leisure  Interests and Hobbies: Enjoys cross stitch, coloring, reading  Social Support: Identifies several supports including both parents, brother, and several close friends   What do you do when you are very stressed, angry, upset, sad or anxious? Isolate from others, Yell/Scream, Cry, Draw/color, Act out and Write in a journal   What helps when you are not feeling well? Wrapping in a blanket, Deep breathing, Additional/Extra medication, Calling a friend or family member, Reading, Drawing, Eating something and Writing in a journal  What are some things that make it MORE difficult for you when you are already upset? Being touched, Being alone/isolated, Not being able to express my opinion, People staring at me, Being criticized and Yelling  Is there anything specific that you would like help with while you're in the partial hospitalization program? Coping Skills, Anger Management, Impulse Control, Stress Management, Sleep and Self-esteem   What is your goal while you are here?  "learn how to manage everything that is going on and to stop obsessing over it all".    Assessment: Pt demonstrates behavior that inhibits/restricts participation in occupation and would benefit from skilled occupational therapy services to address current difficulties with symptom management, emotion regulation, socialization, stress management, time management,  job readiness, financial wellness, health and nutrition, sleep hygiene, ADL/iADL performance and leisure participation, in preparation for reintegration and return to community at discharge.   Plan: Pt will participate in skilled occupational therapy sessions (group and/or individual) in order to promote daily structure, social engagement, and opportunities to develop and utilize adaptive strategies to maximize functional performance in preparation for safe transition and integration back into school, work, and/or the community at discharge. OT sessions will occur 4-5 x per week for 2-4 weeks.   Ponciano Ort, MOT, OTR/L   Group Session:  S: "I watch Youtube videos, this sounds disgusting, but watching the pimple popper videos really help manage my stress in the moment."  O: Today's discussion focused on the topic of stress management. Group members worked collaboratively to create a Environmental consultant identifying physical signs, behavioral signs, emotional/psychological, and cognitive signs of stress. Discussion then focused and encouraged group members to identify positive stress management strategies they could utilize in those moments to manage identified signs.   A: [name] was active and independent in her participation of discussion and activity, offering several relevant and appropriate contributions to group brainstorm. Pt identified her current stress level at a "7 out of 10" and attributed this to "decompressing from the events this weekend". She shared that she had a difficult weekend with her ex-fiance and the transition of care of her daughter over to him for a few days. She also identified feeling anxiety about starting the PHP program today, however acknowledged it being a needed step. She identified "watching Youtube videos" as a current helpful stress management technique and "meditation" as one strategy she would like to try in the future to better manage her stress  effectively.   P: Continue to attend PHP OT group sessions 5x week for 4 weeks to promote daily structure, social engagement, and opportunities to develop and utilize adaptive strategies to maximize functional performance in preparation for safe transition and integration back into school, work, and the community. Plan to address topic of communication in next OT group session.   Plan - 05/20/20 1218    Clinical Impression Statement [name] is a 40 y/o female with PMHx of borderline personality disorder and bipolar I who was referred to [facility] from [facility] after a  recent inpatient hospitalization with reports of increased depression, anxiety, and suicidal thoughts. Pt reports several increased psychosocial stressors including recently finding out that her fianc of five years had been cheating on her for half of their relationship, learning how to navigate responsibilities as a single mom to her 50-year-old daughter, moving back in with her parents, while also managing increased sx related to her mental health. Pt reports a desire to engage in [place] programming in order to manage identified stressors and to engage meaningfully in identified areas of occupation and ADL/iADLs.    OT Occupational Profile and History Problem Focused Assessment - Including review of records relating to presenting problem    Occupational performance deficits (Please refer to evaluation for details): ADL's;IADL's;Rest and Sleep;Education;Work;Leisure;Social Participation    Body Structure / Function / Physical Skills ADL    Cognitive Skills Attention;Consciousness;Emotional;Energy/Drive;Memory;Orientation;Perception;Understand;Thought;Temperament/Personality;Safety Awareness;Problem Solve    Psychosocial Skills Coping Strategies;Environmental  Adaptations;Habits;Interpersonal Interaction;Routines and Behaviors    Rehab Potential Good    Clinical Decision Making Limited treatment options, no task modification necessary    Comorbidities  Affecting Occupational Performance: May have comorbidities impacting occupational performance    Modification or Assistance to Complete Evaluation  No modification of tasks or assist necessary to complete eval    OT Frequency 5x / week    OT Duration 4 weeks    OT Treatment/Interventions Self-care/ADL training;Patient/family education;Coping strategies training;Psychosocial skills training    Consulted and Agree with Plan of Care Patient           Patient will benefit from skilled therapeutic intervention in order to improve the following deficits and impairments:   Body Structure / Function / Physical Skills: ADL Cognitive Skills: Attention,Consciousness,Emotional,Energy/Drive,Memory,Orientation,Perception,Understand,Thought,Temperament/Personality,Safety Awareness,Problem Solve Psychosocial Skills: Coping Strategies,Environmental  Adaptations,Habits,Interpersonal Interaction,Routines and Behaviors   Visit Diagnosis: Difficulty coping  Frontal lobe and executive function deficit  Bipolar 1 disorder, mixed, moderate (HCC)    Problem List Patient Active Problem List   Diagnosis Date Noted  . Bipolar 1 disorder ([place]) 04/22/2020  . MDD (major depressive disorder), recurrent episode, severe ([place]) 04/22/2020  . Relationship problems 01/31/2020  . Worsening headaches 10/13/2019  . Paresthesia 10/13/2019  . Upper extremity weakness 10/13/2019  . Other symptoms and signs involving the nervous system 10/13/2019  . 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 Nevus 03/15/2017  . 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 Encounter for insertion of intrauterine contraceptive device (IUD) 08/05/2016  . Iron deficiency anemia 03/20/2016  . Borderline personality disorder ([place]) 07/09/2015  . Generalized anxiety disorder 07/09/2015  . Bipolar 1 disorder, mixed, moderate ([place]) 07/09/2015  . Left breast lump 03/05/2015    05/20/2020  Ponciano Ort, MOT, OTR/L  05/20/2020, 12:24 PM  [facility] PARTIAL HOSPITALIZATION PROGRAM [place], [place], [address] Phone: [phone]   Fax:  [phone]  Name: [name] MRN: [id number] Date of Birth: 1981/01/18

## 2020-05-21 ENCOUNTER — Encounter (HOSPITAL_COMMUNITY): Payer: Self-pay

## 2020-05-21 ENCOUNTER — Other Ambulatory Visit (HOSPITAL_COMMUNITY): Payer: No Typology Code available for payment source | Admitting: Licensed Clinical Social Worker

## 2020-05-21 ENCOUNTER — Other Ambulatory Visit: Payer: Self-pay

## 2020-05-21 ENCOUNTER — Other Ambulatory Visit (HOSPITAL_COMMUNITY): Payer: No Typology Code available for payment source | Admitting: Occupational Therapy

## 2020-05-21 DIAGNOSIS — R4589 Other symptoms and signs involving emotional state: Secondary | ICD-10-CM

## 2020-05-21 DIAGNOSIS — F3162 Bipolar disorder, current episode mixed, moderate: Secondary | ICD-10-CM | POA: Diagnosis not present

## 2020-05-21 DIAGNOSIS — F332 Major depressive disorder, recurrent severe without psychotic features: Secondary | ICD-10-CM

## 2020-05-21 DIAGNOSIS — R41844 Frontal lobe and executive function deficit: Secondary | ICD-10-CM
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
## 2020-05-22 ENCOUNTER — Encounter (HOSPITAL_COMMUNITY): Payer: Self-pay

## 2020-05-22 ENCOUNTER — Encounter (HOSPITAL_COMMUNITY): Payer: Self-pay | Admitting: Family

## 2020-05-22 ENCOUNTER — Other Ambulatory Visit (HOSPITAL_COMMUNITY): Payer: No Typology Code available for payment source | Admitting: Occupational Therapy

## 2020-05-22 ENCOUNTER — Other Ambulatory Visit: Payer: Self-pay

## 2020-05-22 ENCOUNTER — Other Ambulatory Visit (HOSPITAL_COMMUNITY): Payer: No Typology Code available for payment source | Admitting: Licensed Clinical Social Worker

## 2020-05-22 DIAGNOSIS — F3162 Bipolar disorder, current episode mixed, moderate: Secondary | ICD-10-CM | POA: Diagnosis not present

## 2020-05-22 DIAGNOSIS — F603 Borderline personality disorder: Secondary | ICD-10-CM

## 2020-05-22 DIAGNOSIS — R4589 Other symptoms and signs involving emotional state: Secondary | ICD-10-CM

## 2020-05-22 DIAGNOSIS — R41844 Frontal lobe and executive function deficit: Secondary | ICD-10-CM

## 2020-05-22 DIAGNOSIS — F332 Major depressive disorder, recurrent severe without psychotic features: Secondary | ICD-10-CM

## 2020-05-22 MED ORDER — TRAZODONE HCL 50 MG PO TABS
50.0000 mg | ORAL_TABLET | Freq: Every day | ORAL | 0 refills | Status: DC
Start: 2020-05-22 — End: 2020-06-05

## 2020-05-22 NOTE — Progress Notes (Signed)
Pt attended spiritual care group   05/22/2020  11:00-12:00.  Group met via web-ex due to COVID-19 precautions.  Group facilitated by Simone Curia, MDiv, [place]   Group focused on topic of "community."  Members reflected on topic in facilitated dialog, identifying responses to topic and notions they hold of community from their previous experience.  Group members utilized value sort cards to identify top qualities they look for in community.  Engaged in facilitated dialog around their value choices, noting origin of these values, how these are realized in their lives, and strategies for engaging these values.    Spiritual care group drew on Motivational Interviewing, Narrative and Adlerian modalities.  [name] was present throughout group.  Engaged in group discussion and activity. [name] identified challenge in finding community [place] - noted "everyone sees me as who I used to be." Identified values that she holds generally and specific values that she holds for herself and her daughter.  She was able to more readily name values for her daughter and identified difficulty in creating those values for her self, as she feels she is "not worth it."   Engaged with group around idea of holding community values for herself - power, worth, space for growth, tolerance.  Spoke with another group member about creating space for these values while being a parent.
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
## 2020-05-22 NOTE — Therapy (Signed)
[place], [place], [address] Phone: [phone]   Fax:  [phone] Virtual Visit via Video Note  I connected with [name] on 05/22/20 at  12:00 PM EST by a video enabled telemedicine application and verified that I am speaking with the correct person using two identifiers.  Location: Patient: Patient Home Provider: Clinic Office    I discussed the limitations of evaluation and management by telemedicine and the availability of in person appointments. The patient expressed understanding and agreed to proceed.   I discussed the assessment and treatment plan with the patient. The patient was provided an opportunity to ask questions and all were answered. The patient agreed with the plan and demonstrated an understanding of the instructions.   The patient was advised to call back or seek an in-person evaluation if the symptoms worsen or if the condition fails to improve as anticipated.  I provided 55 minutes of non-face-to-face time during this encounter.   Ponciano Ort, OT   Occupational Therapy Treatment  Patient Details  Name: [name] MRN: [id number] Date of Birth: 1980-06-01 Referring Provider (OT): Ricky Ala   Encounter Date: 05/22/2020   OT End of Session - 05/22/20 1301    Visit Number 3    Number of Visits 20    Date for OT Re-Evaluation 06/17/20    Authorization Type [employer]/[place] Preferred    OT Start Time 1200    OT Stop Time 1255    OT Time Calculation (min) 55 min    Activity Tolerance Patient tolerated treatment well    Behavior During Therapy [facility] for tasks assessed/performed           Past Medical History:  Diagnosis Date  . Headache   . History of bipolar disorder   . History of pancreatitis   . Hx of varicella   . Migraines   . Vaginal Pap smear, abnormal     Past Surgical History:  Procedure Laterality Date  . CESAREAN SECTION N/A 06/04/2016    Procedure: CESAREAN SECTION;  Surgeon: Tyson Dense, MD;  Location: [facility];  Service: Obstetrics;  Laterality: N/A;  . MOUTH SURGERY     x 2, wisdom teeth, adjust teeth    There were no vitals filed for this visit.   Subjective Assessment - 05/22/20 1300    Currently in Pain? No/denies    Pain Score 0-No pain           OT Education - 05/22/20 1300    Education Details Educated on tips and strategies to improve overall self-care    Person(s) Educated Patient    Methods Explanation;Handout    Comprehension Verbalized understanding            OT Short Term Goals - 05/22/20 0810      OT SHORT TERM GOAL #1   Status On-going      OT SHORT TERM GOAL #2   Status On-going      OT SHORT TERM GOAL #3   Status On-going      OT SHORT TERM GOAL #4   Status On-going         Group Session:  S: "Yesterday after group I did some coloring and watched my safe movie, The Labyrinth."  O: Today's group session focused on topic of self-care and group reviewed a self-care assessment that identified a specific category within self-care that they needed the most improvement in, including physical, emotional/psychological,  social, spiritual, and professional self-care. Discussion focused on members sharing which areas they need the most work in and brainstormed strategies and tips on improving one's self-care with the thought that what someone identifies as their "best self" changes day-to-day contingent upon mood, emotions, and situations. Members identified both one "big" and one "small" self-care activity they can and would like to engage in the near future.   A: [name] was active and independent in her participation of discussion and activity during today's session. She shared that she would like to work more on her professional self-care, specifically, learning to say "no" to excessive work responsibilities. She shared that some things she does currently to manage her  self-care are to color and watch some of her favorite movies and TV shows. Pt expressed interest in journaling more often and splitting her positive and negative journal entries into separate journals as a way to set intention to her writing. Pt receptive to several additional strategies provided by other group members, while also offering her own.   P: Continue to attend PHP OT group sessions 5x week for 3 weeks to promote daily structure, social engagement, and opportunities to develop and utilize adaptive strategies to maximize functional performance in preparation for safe transition and integration back into school, work, and the community. Plan to address topic of communication in next OT group session.    Plan - 05/22/20 1302    Occupational performance deficits (Please refer to evaluation for details): ADL's;IADL's;Rest and Sleep;Education;Work;Leisure;Social Participation    Body Structure / Function / Physical Skills ADL    Cognitive Skills Attention;Consciousness;Emotional;Energy/Drive;Memory;Orientation;Perception;Understand;Thought;Temperament/Personality;Safety Awareness;Problem Solve    Psychosocial Skills Coping Strategies;Environmental  Adaptations;Habits;Interpersonal Interaction;Routines and Behaviors           Patient will benefit from skilled therapeutic intervention in order to improve the following deficits and impairments:   Body Structure / Function / Physical Skills: ADL Cognitive Skills: Attention,Consciousness,Emotional,Energy/Drive,Memory,Orientation,Perception,Understand,Thought,Temperament/Personality,Safety Awareness,Problem Solve Psychosocial Skills: Coping Strategies,Environmental  Adaptations,Habits,Interpersonal Interaction,Routines and Behaviors   Visit Diagnosis: Difficulty coping  Frontal lobe and executive function deficit  Bipolar 1 disorder, mixed, moderate (HCC)    Problem List Patient Active Problem List   Diagnosis Date Noted  . Bipolar 1  disorder (HCC) 04/22/2020  . MDD (major depressive disorder), recurrent episode, severe (HCC) 04/22/2020  . Relationship problems 01/31/2020  . Worsening headaches 10/13/2019  . Paresthesia 10/13/2019  . Upper extremity weakness 10/13/2019  . Other symptoms and signs involving the nervous system 10/13/2019  . 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 Nevus 03/15/2017  . 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 Encounter for insertion of intrauterine contraceptive device (IUD) 08/05/2016  . Iron deficiency anemia 03/20/2016  . Borderline personality disorder (HCC) 07/09/2015  . Generalized anxiety disorder 07/09/2015  . Bipolar 1 disorder, mixed, moderate (HCC) 07/09/2015  . Left breast lump 03/05/2015    05/22/2020  Donne Hazel, MOT, OTR/L  05/22/2020, 1:03 PM  [facility] HOSPITALIZATION PROGRAM [address] [place], [place], [address] Phone: [phone]   Fax:  [phone]  Name: [name] MRN: [id number] Date of Birth: 1981/03/30

## 2020-05-22 NOTE — Therapy (Signed)
[place], [place], [address] Phone: [phone]   Fax:  [phone] Virtual Visit via Video Note  I connected with [name] on 05/21/20 at  12:00 PM EST by a video enabled telemedicine application and verified that I am speaking with the correct person using two identifiers.  Location: Patient: Patient Home Provider: Clinic Office    I discussed the limitations of evaluation and management by telemedicine and the availability of in person appointments. The patient expressed understanding and agreed to proceed.   I discussed the assessment and treatment plan with the patient. The patient was provided an opportunity to ask questions and all were answered. The patient agreed with the plan and demonstrated an understanding of the instructions.   The patient was advised to call back or seek an in-person evaluation if the symptoms worsen or if the condition fails to improve as anticipated.  I provided 55 minutes of non-face-to-face time during this encounter.   Ponciano Ort, OT   Occupational Therapy Treatment  Patient Details  Name: [name] MRN: [id number] Date of Birth: 1980/12/12 Referring Provider (OT): Ricky Ala   Encounter Date: 05/21/2020   OT End of Session - 05/22/20 0810    Visit Number 2    Number of Visits 20    Date for OT Re-Evaluation 06/17/20    Authorization Type [employer]/[place] Preferred    OT Start Time 1200    OT Stop Time 1255    OT Time Calculation (min) 55 min    Activity Tolerance Patient tolerated treatment well    Behavior During Therapy [facility] for tasks assessed/performed           Past Medical History:  Diagnosis Date  . Headache   . History of bipolar disorder   . History of pancreatitis   . Hx of varicella   . Migraines   . Vaginal Pap smear, abnormal     Past Surgical History:  Procedure Laterality Date  . CESAREAN SECTION N/A 06/04/2016    Procedure: CESAREAN SECTION;  Surgeon: Tyson Dense, MD;  Location: [facility];  Service: Obstetrics;  Laterality: N/A;  . MOUTH SURGERY     x 2, wisdom teeth, adjust teeth    There were no vitals filed for this visit.   Subjective Assessment - 05/22/20 0809    Currently in Pain? No/denies    Pain Score 0-No pain                OT Education - 05/22/20 0809    Education Details Educated on concept of sensory modulation and self-soothing as coping strategies through use of the eight senses    Person(s) Educated Patient    Methods Explanation;Handout    Comprehension Verbalized understanding            OT Short Term Goals - 05/22/20 0810      OT SHORT TERM GOAL #1   Status On-going      OT SHORT TERM GOAL #2   Status On-going      OT SHORT TERM GOAL #3   Status On-going      OT SHORT TERM GOAL #4   Status On-going         Group Session:  S: "I love amusement park rides and any like adrenaline pumping activities - I used to sky dive all the time and I love that stomach drop feeling."  O: Today's group session focused on  topic of sensory modulation and self-soothing through use of the 8 senses. Discussion introduced the concept of sensory modulation and integration, focusing on how we can utilize our body and it's senses to self-soothe or cope, when we are experiencing an over or under-whelming sensation or feeling. Group members were introduced to a sensory diet checklist as a helpful tool/resource that can be utilized to identify what activities and strategies we prefer and do not prefer based upon our response to different stimulus. The concept of alerting vs calming activities was also introduced to understand how to counteract how we are feeling (Example: when we are feeling overwhelmed/stressed, engage in something calming. When we are feeling depressed/low energy, engage in something alerting). Group members engaged actively in discussion sharing  their own personal sensory likes/dislikes.    A: Deaira was active and independent in her participation of discussion on sensory strategies and self-soothing. Pt expressed several sensory activities she enjoys and identifies as calming including riding roller coasters, skydiving, fidgeting with objects, building stuff, looking at the stars at night, and listening to pod casts. Pt also expressed difficulty with music at this time, sharing it was a trigger for bad memories, however has since utilized pod casts as an alternative. She appeared interested and receptive to additional education and strategies provided on additional self-soothing activities.   P: Continue to attend PHP OT group sessions 5x week for 3 weeks to promote daily structure, social engagement, and opportunities to develop and utilize adaptive strategies to maximize functional performance in preparation for safe transition and integration back into school, work, and the community.    Plan - 05/22/20 0810    Occupational performance deficits (Please refer to evaluation for details): ADL's;IADL's;Rest and Sleep;Education;Work;Leisure;Social Participation    Body Structure / Function / Physical Skills ADL    Cognitive Skills Attention;Consciousness;Emotional;Energy/Drive;Memory;Orientation;Perception;Understand;Thought;Temperament/Personality;Safety Awareness;Problem Solve    Psychosocial Skills Coping Strategies;Environmental  Adaptations;Habits;Interpersonal Interaction;Routines and Behaviors           Patient will benefit from skilled therapeutic intervention in order to improve the following deficits and impairments:   Body Structure / Function / Physical Skills: ADL Cognitive Skills: Attention,Consciousness,Emotional,Energy/Drive,Memory,Orientation,Perception,Understand,Thought,Temperament/Personality,Safety Awareness,Problem Solve Psychosocial Skills: Coping Strategies,Environmental  Adaptations,Habits,Interpersonal  Interaction,Routines and Behaviors   Visit Diagnosis: Difficulty coping  Frontal lobe and executive function deficit  Bipolar 1 disorder, mixed, moderate (HCC)    Problem List Patient Active Problem List   Diagnosis Date Noted  . Bipolar 1 disorder (HCC) 04/22/2020  . MDD (major depressive disorder), recurrent episode, severe (HCC) 04/22/2020  . Relationship problems 01/31/2020  . Worsening headaches 10/13/2019  . Paresthesia 10/13/2019  . Upper extremity weakness 10/13/2019  . Other symptoms and signs involving the nervous system 10/13/2019  . 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 Nevus 03/15/2017  . 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 Encounter for insertion of intrauterine contraceptive device (IUD) 08/05/2016  . Iron deficiency anemia 03/20/2016  . Borderline personality disorder (HCC) 07/09/2015  . Generalized anxiety disorder 07/09/2015  . Bipolar 1 disorder, mixed, moderate (HCC) 07/09/2015  . Left breast lump 03/05/2015    05/22/2020  Donne Hazel, MOT, OTR/L  05/22/2020, 8:11 AM  [facility] HOSPITALIZATION PROGRAM [address] [place], [place], [address] Phone: [phone]   Fax:  [phone]  Name: NETTYE FLEGAL MRN: [id number] Date of Birth: 1980-07-05

## 2020-05-23 ENCOUNTER — Other Ambulatory Visit: Payer: Self-pay

## 2020-05-23 ENCOUNTER — Other Ambulatory Visit (HOSPITAL_COMMUNITY): Payer: No Typology Code available for payment source | Admitting: Occupational Therapy

## 2020-05-23 ENCOUNTER — Encounter (HOSPITAL_COMMUNITY): Payer: Self-pay

## 2020-05-23 ENCOUNTER — Encounter: Payer: Self-pay | Admitting: Physician Assistant

## 2020-05-23 ENCOUNTER — Other Ambulatory Visit (HOSPITAL_COMMUNITY): Payer: No Typology Code available for payment source | Admitting: Licensed Clinical Social Worker

## 2020-05-23 DIAGNOSIS — F332 Major depressive disorder, recurrent severe without psychotic features: Secondary | ICD-10-CM

## 2020-05-23 DIAGNOSIS — F3162 Bipolar disorder, current episode mixed, moderate: Secondary | ICD-10-CM | POA: Diagnosis not present

## 2020-05-23 DIAGNOSIS — R4589 Other symptoms and signs involving emotional state: Secondary | ICD-10-CM

## 2020-05-23 DIAGNOSIS — R41844 Frontal lobe and executive function deficit: Secondary | ICD-10-CM

## 2020-05-23 DIAGNOSIS — F603 Borderline personality disorder: Secondary | ICD-10-CM

## 2020-05-23 NOTE — Therapy (Signed)
[place], [place], [address] Phone: [phone]   Fax:  [phone] Virtual Visit via Video Note  I connected with [name] on 05/23/20 at  11:00 AM EST by a video enabled telemedicine application and verified that I am speaking with the correct person using two identifiers.  Location: Patient: Patient Home Provider: Clinic Office    I discussed the limitations of evaluation and management by telemedicine and the availability of in person appointments. The patient expressed understanding and agreed to proceed.   I discussed the assessment and treatment plan with the patient. The patient was provided an opportunity to ask questions and all were answered. The patient agreed with the plan and demonstrated an understanding of the instructions.   The patient was advised to call back or seek an in-person evaluation if the symptoms worsen or if the condition fails to improve as anticipated.  I provided 55 minutes of non-face-to-face time during this encounter.   Ponciano Ort, OT   Occupational Therapy Treatment  Patient Details  Name: [name] MRN: [id number] Date of Birth: 02-09-1981 Referring Provider (OT): Ricky Ala   Encounter Date: 05/23/2020   OT End of Session - 05/23/20 1225    Visit Number 4    Number of Visits 20    Date for OT Re-Evaluation 06/17/20    Authorization Type [employer]/[place] Preferred    OT Start Time 1110    OT Stop Time 1205    OT Time Calculation (min) 55 min    Activity Tolerance Patient tolerated treatment well    Behavior During Therapy [facility] for tasks assessed/performed           Past Medical History:  Diagnosis Date  . Headache   . History of bipolar disorder   . History of pancreatitis   . Hx of varicella   . Migraines   . Vaginal Pap smear, abnormal     Past Surgical History:  Procedure Laterality Date  . CESAREAN SECTION N/A 06/04/2016    Procedure: CESAREAN SECTION;  Surgeon: Tyson Dense, MD;  Location: [facility];  Service: Obstetrics;  Laterality: N/A;  . MOUTH SURGERY     x 2, wisdom teeth, adjust teeth    There were no vitals filed for this visit.   Subjective Assessment - 05/23/20 1225    Currently in Pain? No/denies    Pain Score 0-No pain                 OT Education - 05/23/20 1225    Education Details Educated on fight-or-flight response and use of relaxation strategies including deep breathing, guided imagery, and PMR    Person(s) Educated Patient    Methods Explanation;Handout    Comprehension Verbalized understanding            OT Short Term Goals - 05/22/20 0810      OT SHORT TERM GOAL #1   Status On-going      OT SHORT TERM GOAL #2   Status On-going      OT SHORT TERM GOAL #3   Status On-going      OT SHORT TERM GOAL #4   Status On-going         Group Session:  S: "I did a lot more coloring yesterday, and also I started that positive journal thing, which was super helpful I think."  O: Group began with a warm-up activity and group members were encouraged  to share and identify ways in which they have practiced relaxation in the past, including any specific strategies or hobbies. Today's discussion focused on the topic of RELAXATION and patients reviewed and engaged in a variety of relaxation strategies techniques including deep breathing, guided imagery/meditation, and progressive muscle relaxation. After each strategy was reviewed, group members were invited to engage in an active practice of relaxation strategies identified. Review and background on the fight-or-flight response was also provided.   A: Darlina was active and independent in her participation of discussion and activity, sharing during warm-up that her self-care activities yesterday consisted of coloring and positive journal writing. She appeared receptive to relaxation strategies reviewed during today's  session and expressed frustration with deep breathing in the past, though noted "there was never a specific strategy so maybe that's why I never got the benefit I needed from it." Pt appeared willing to practice the new strategies reviewed, specifically five finger breaths, and add additional strategies to her therapeutic toolbox.   P: Continue to attend PHP OT group sessions 5x week for 3 weeks to promote daily structure, social engagement, and opportunities to develop and utilize adaptive strategies to maximize functional performance in preparation for safe transition and integration back into school, work, and the community.    Plan - 05/23/20 1225    Occupational performance deficits (Please refer to evaluation for details): ADL's;IADL's;Rest and Sleep;Education;Work;Leisure;Social Participation    Body Structure / Function / Physical Skills ADL    Cognitive Skills Attention;Consciousness;Emotional;Energy/Drive;Memory;Orientation;Perception;Understand;Thought;Temperament/Personality;Safety Awareness;Problem Solve    Psychosocial Skills Coping Strategies;Environmental  Adaptations;Habits;Interpersonal Interaction;Routines and Behaviors           Patient will benefit from skilled therapeutic intervention in order to improve the following deficits and impairments:   Body Structure / Function / Physical Skills: ADL Cognitive Skills: Attention,Consciousness,Emotional,Energy/Drive,Memory,Orientation,Perception,Understand,Thought,Temperament/Personality,Safety Awareness,Problem Solve Psychosocial Skills: Coping Strategies,Environmental  Adaptations,Habits,Interpersonal Interaction,Routines and Behaviors   Visit Diagnosis: Difficulty coping  Frontal lobe and executive function deficit  Bipolar 1 disorder, mixed, moderate (HCC)    Problem List Patient Active Problem List   Diagnosis Date Noted  . Bipolar 1 disorder (HCC) 04/22/2020  . MDD (major depressive disorder), recurrent episode,  severe (HCC) 04/22/2020  . Relationship problems 01/31/2020  . Worsening headaches 10/13/2019  . Paresthesia 10/13/2019  . Upper extremity weakness 10/13/2019  . Other symptoms and signs involving the nervous system 10/13/2019  . 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 Nevus 03/15/2017  . 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 Encounter for insertion of intrauterine contraceptive device (IUD) 08/05/2016  . Iron deficiency anemia 03/20/2016  . Borderline personality disorder (HCC) 07/09/2015  . Generalized anxiety disorder 07/09/2015  . Bipolar 1 disorder, mixed, moderate (HCC) 07/09/2015  . Left breast lump 03/05/2015    05/23/2020  Donne Hazel, MOT, OTR/L  05/23/2020, 12:26 PM  [facility] PARTIAL HOSPITALIZATION PROGRAM [address] [place], [place], [address] Phone: [phone]   Fax:  [phone]  Name: CHARLINE HOSKINSON MRN: [id number] Date of Birth: 1981/04/08

## 2020-05-24 ENCOUNTER — Other Ambulatory Visit (HOSPITAL_COMMUNITY): Payer: No Typology Code available for payment source | Admitting: Occupational Therapy

## 2020-05-24 ENCOUNTER — Other Ambulatory Visit (HOSPITAL_COMMUNITY): Payer: No Typology Code available for payment source | Admitting: Licensed Clinical Social Worker

## 2020-05-24 ENCOUNTER — Other Ambulatory Visit: Payer: Self-pay

## 2020-05-24 ENCOUNTER — Encounter (HOSPITAL_COMMUNITY): Payer: Self-pay

## 2020-05-24 DIAGNOSIS — F3162 Bipolar disorder, current episode mixed, moderate: Secondary | ICD-10-CM

## 2020-05-24 DIAGNOSIS — R4589 Other symptoms and signs involving emotional state: Secondary | ICD-10-CM

## 2020-05-24 DIAGNOSIS — R41844 Frontal lobe and executive function deficit: Secondary | ICD-10-CM

## 2020-05-24 DIAGNOSIS — F332 Major depressive disorder, recurrent severe without psychotic features: Secondary | ICD-10-CM

## 2020-05-24 DIAGNOSIS — F603 Borderline personality disorder: Secondary | ICD-10-CM

## 2020-05-24 NOTE — Therapy (Signed)
[place], [place], [address] Phone: [phone]   Fax:  [phone] Virtual Visit via Video Note  I connected with [name] on 05/24/20 at  11:00 AM EST by a video enabled telemedicine application and verified that I am speaking with the correct person using two identifiers.  Location: Patient: Patient Home Provider: Clinic Office     I discussed the limitations of evaluation and management by telemedicine and the availability of in person appointments. The patient expressed understanding and agreed to proceed.   I discussed the assessment and treatment plan with the patient. The patient was provided an opportunity to ask questions and all were answered. The patient agreed with the plan and demonstrated an understanding of the instructions.   The patient was advised to call back or seek an in-person evaluation if the symptoms worsen or if the condition fails to improve as anticipated.  I provided 65 minutes of non-face-to-face time during this encounter.   Ponciano Ort, OT   Occupational Therapy Treatment  Patient Details  Name: [name] MRN: [id number] Date of Birth: [birth year] Referring Provider (OT): Ricky Ala   Encounter Date: 05/24/2020   OT End of Session - 05/24/20 1433    Visit Number 5    Number of Visits 20    Date for OT Re-Evaluation 06/17/20    Authorization Type [employer]na/[place] Preferred    OT Start Time 1110    OT Stop Time 1215    OT Time Calculation (min) 65 min    Activity Tolerance Patient tolerated treatment well    Behavior During Therapy [facility] for tasks assessed/performed           Past Medical History:  Diagnosis Date  . Headache   . History of bipolar disorder   . History of pancreatitis   . Hx of varicella   . Migraines   . Vaginal Pap smear, abnormal     Past Surgical History:  Procedure Laterality Date  . CESAREAN SECTION N/A 06/04/2016    Procedure: CESAREAN SECTION;  Surgeon: Tyson Dense, MD;  Location: [facility];  Service: Obstetrics;  Laterality: N/A;  . MOUTH SURGERY     x 2, wisdom teeth, adjust teeth    There were no vitals filed for this visit.   Subjective Assessment - 05/24/20 1433    Currently in Pain? No/denies    Pain Score 0-No pain           OT Education - 05/24/20 1433    Education Details Educated on how to navigate aggressive communication and reviewed strategies/tips to "fight fair"    Person(s) Educated Patient    Methods Explanation;Handout    Comprehension Verbalized understanding            OT Short Term Goals - 05/22/20 0810      OT SHORT TERM GOAL #1   Status On-going      OT SHORT TERM GOAL #2   Status On-going      OT SHORT TERM GOAL #3   Status On-going      OT SHORT TERM GOAL #4   Status On-going         Group Session:  S: "I thought about the five finger breaths yesterday but honestly I didn't even try it because I was so worked up."  O: Group began with a reflection from previous OT session on relaxation strategies and reviewed any practiced the previous afternoon.Today's  group was an expansion on assertive training and communication with a focus on [place]. Group members were provided with a set of 'rules' and tips on how to fight fairly, in order to come off as more assertive and less aggressive. Members identified one rule or tip they felt they struggled most with and were encouraged to focus on improving their communication within identified rule.   A: [name] was active and independent in her participation of discussion and activity, sharing she thought about the new relaxation strategies she learned, however did not actively practice them yesterday when she was experiencing an intense feeling and being "worked up." She shared that she struggles with aggressive communication and will often take conversations "too far" and spoke about "rage texting  her ex" when really caught up in her emotions. She identified need to work on her degrading language, in addition to active listening, appearing somewhat receptive to the idea of setting a timer and taking turns when discussing difficult topics. Pt also encouraged to verbalize when she needs to walk away, as a way to let others know she needs a break. Appeared receptive to additional strategies offered.   P: Continue to attend PHP OT group sessions 5x week for 3 weeks to promote daily structure, social engagement, and opportunities to develop and utilize adaptive strategies to maximize functional performance in preparation for safe transition and integration back into school, work, and the community.    Plan - 05/24/20 1433    Occupational performance deficits (Please refer to evaluation for details): ADL's;IADL's;Rest and Sleep;Education;Work;Leisure;Social Participation    Body Structure / Function / Physical Skills ADL    Cognitive Skills Attention;Consciousness;Emotional;Energy/Drive;Memory;Orientation;Perception;Understand;Thought;Temperament/Personality;Safety Awareness;Problem Solve    Psychosocial Skills Coping Strategies;Environmental  Adaptations;Habits;Interpersonal Interaction;Routines and Behaviors           Patient will benefit from skilled therapeutic intervention in order to improve the following deficits and impairments:   Body Structure / Function / Physical Skills: ADL Cognitive Skills: Attention,Consciousness,Emotional,Energy/Drive,Memory,Orientation,Perception,Understand,Thought,Temperament/Personality,Safety Awareness,Problem Solve Psychosocial Skills: Coping Strategies,Environmental  Adaptations,Habits,Interpersonal Interaction,Routines and Behaviors   Visit Diagnosis: Difficulty coping  Frontal lobe and executive function deficit  Bipolar 1 disorder, mixed, moderate ([place])    Problem List Patient Active Problem List   Diagnosis Date Noted  . Bipolar 1 disorder  ([place]) 04/22/2020  . MDD (major depressive disorder), recurrent episode, severe ([place]) 04/22/2020  . Relationship problems 01/31/2020  . Worsening headaches 10/13/2019  . Paresthesia 10/13/2019  . Upper extremity weakness 10/13/2019  . Other symptoms and signs involving the nervous system 10/13/2019  . 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 Nevus 03/15/2017  . 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 Encounter for insertion of intrauterine contraceptive device (IUD) 08/05/2016  . Iron deficiency anemia 03/20/2016  . Borderline personality disorder ([place]) 07/09/2015  . Generalized anxiety disorder 07/09/2015  . Bipolar 1 disorder, mixed, moderate ([place]) 07/09/2015  . Left breast lump 03/05/2015    05/24/2020  Ponciano Ort, MOT, OTR/L  05/24/2020, 2:34 PM  [place], [place], [address] Phone: [phone]   Fax:  [phone]  Name: [name] MRN: [id number] Date of Birth: [birth year]

## 2020-05-24 NOTE — Telephone Encounter (Signed)
Has this been received/completed?

## 2020-05-27 ENCOUNTER — Encounter (HOSPITAL_COMMUNITY): Payer: Self-pay

## 2020-05-27 ENCOUNTER — Other Ambulatory Visit: Payer: Self-pay

## 2020-05-27 ENCOUNTER — Other Ambulatory Visit (HOSPITAL_COMMUNITY): Payer: No Typology Code available for payment source | Admitting: Licensed Clinical Social Worker

## 2020-05-27 ENCOUNTER — Encounter (HOSPITAL_COMMUNITY): Payer: Self-pay | Admitting: Family

## 2020-05-27 ENCOUNTER — Other Ambulatory Visit (HOSPITAL_COMMUNITY): Payer: No Typology Code available for payment source | Admitting: Occupational Therapy

## 2020-05-27 DIAGNOSIS — F3162 Bipolar disorder, current episode mixed, moderate: Secondary | ICD-10-CM

## 2020-05-27 DIAGNOSIS — R41844 Frontal lobe and executive function deficit: Secondary | ICD-10-CM

## 2020-05-27 DIAGNOSIS — F332 Major depressive disorder, recurrent severe without psychotic features: Secondary | ICD-10-CM

## 2020-05-27 DIAGNOSIS — F411 Generalized anxiety disorder: Secondary | ICD-10-CM

## 2020-05-27 DIAGNOSIS — R4589 Other symptoms and signs involving emotional state: Secondary | ICD-10-CM

## 2020-05-27 NOTE — Progress Notes (Signed)
Spoke with patient via Webex video call, used 2 identifiers to correctly identify patient. She had a good weekend. Sleep is improving with the help of Trazodone. Now sleeping 8-10 hours. No side effects from medications. On scale 1-10 as 10 being worst she rates depression at 5/6 and anxiety at 6/7. Denies SI/HI or AV hallucinations. No issues or complaints. Groups are going well and she is glad she is in them. Gaining a lot of useful information.

## 2020-05-27 NOTE — Progress Notes (Signed)
Virtual Visit via Video Note  I connected with [name] on 05/27/20 at  9:00 AM EST by a video enabled telemedicine application and verified that I am speaking with the correct person using two identifiers.  Location: Patient: Home Provider: Office   I discussed the limitations of evaluation and management by telemedicine and the availability of in person appointments. The patient expressed understanding and agreed to proceed.  I discussed the assessment and treatment plan with the patient. The patient was provided an opportunity to ask questions and all were answered. The patient agreed with the plan and demonstrated an understanding of the instructions.   The patient was advised to call back or seek an in-person evaluation if the symptoms worsen or if the condition fails to improve as anticipated.  I provided 15 minutes of non-face-to-face time during this encounter.   Derrill Center, NP   BH MD/PA/NP OP Progress Note  05/27/2020 12:59 PM JALEEYAH MUNCE  MRN:  [id number]    Evaluation: [name] was evaluated via teleassessment.  She is awake, alert and oriented x3.  Denying suicidal or homicidal ideations.  Denies auditory or visual hallucinations.  Patient presents with a bright and pleasant affect throughout this assessment.  Stating " I had a good weekend I got to spend time with my daughter."  Patient reports taking medications as directed.  Patient is currently residing with her parents.  Denying any medication side effects.  Reports a good appetite.  States she is resting well throughout the night.  Patient to continue daily group sessions.  Support, encouragement reassurance was provided.  HPI:Per admission assessment note [name] is a 40 y.o. Caucasian female presents after recent inpatient admission for suicidal attempt.  Reported struggling with depression and multiple stressors.  Reported previous inpatient admissions.  Reported diagnosis with major depressive disorder,  generalized anxiety disorder, bipolar and borderline personality disorder.  Reported a recent break-up triggered this inpatient admission as she attempted to hang herself. Visit Diagnosis:    ICD-10-CM   1. MDD (major depressive disorder), recurrent severe, without psychosis ([place])  F33.2   2. Generalized anxiety disorder  F41.1     Past Psychiatric History:   Past Medical History:  Past Medical History:  Diagnosis Date  . Headache   . History of bipolar disorder   . History of pancreatitis   . Hx of varicella   . Migraines   . Vaginal Pap smear, abnormal     Past Surgical History:  Procedure Laterality Date  . CESAREAN SECTION N/A 06/04/2016   Procedure: CESAREAN SECTION;  Surgeon: Tyson Dense, MD;  Location: [facility];  Service: Obstetrics;  Laterality: N/A;  . MOUTH SURGERY     x 2, wisdom teeth, adjust teeth    Family Psychiatric History:   Family History:  Family History  Problem Relation Age of Onset  . Stroke Father   . Heart attack Father   . Cancer Maternal Aunt        stomach  . Cancer Maternal Grandmother        ovarian  . Headache Mother   . Migraines Brother   . Depression Paternal Grandmother     Social History:  Social History   Socioeconomic History  . Marital status: Single    Spouse name: Not on file  . Number of children: 1  . Years of education: Not on file  . Highest education level: High school graduate  Occupational History  . Not on file  Tobacco Use  . Smoking status: Former Smoker    Packs/day: 0.50    Types: Cigarettes    Quit date: 09/16/2015    Years since quitting: 4.6  . Smokeless tobacco: Never Used  Vaping Use  . Vaping Use: Never used  Substance and Sexual Activity  . Alcohol use: Not Currently    Alcohol/week: 2.0 standard drinks    Types: 2 Standard drinks or equivalent per week  . Drug use: Never  . Sexual activity: Yes  Other Topics Concern  . Not on file  Social History Narrative   Lives with  child   Caffeine- sodas 16 oz, 2 daily   Social Determinants of Health   Financial Resource Strain: Not on file  Food Insecurity: Not on file  Transportation Needs: Not on file  Physical Activity: Not on file  Stress: Not on file  Social Connections: Not on file    Allergies:  Allergies  Allergen Reactions  . Codeine Swelling    Metabolic Disorder Labs: Lab Results  Component Value Date   HGBA1C 5.1 04/21/2020   MPG 99.67 04/21/2020   MPG 99.67 03/26/2020   Lab Results  Component Value Date   PROLACTIN 26.0 (H) 04/21/2020   PROLACTIN 7.0 03/05/2015   Lab Results  Component Value Date   CHOL 184 04/21/2020   TRIG 52 04/21/2020   HDL 46 04/21/2020   CHOLHDL 4.0 04/21/2020   VLDL 10 04/21/2020   LDLCALC 128 (H) 04/21/2020   LDLCALC 95 03/26/2020   Lab Results  Component Value Date   TSH 2.469 04/21/2020   TSH 1.565 03/26/2020    Therapeutic Level Labs: No results found for: LITHIUM No results found for: VALPROATE No components found for:  CBMZ  Current Medications: Current Outpatient Medications  Medication Sig Dispense Refill  . FLUoxetine (PROZAC) 40 MG capsule TAKE 1 CAPSULE BY MOUTH EVERY DAY 90 capsule 0  . hydrOXYzine (ATARAX/VISTARIL) 25 MG tablet Take 1 tablet (25 mg total) by mouth 3 (three) times daily as needed for anxiety. 75 tablet 0  . lamoTRIgine (LAMICTAL) 100 MG tablet Take 1 tablet (100 mg total) by mouth 2 (two) times daily. For mood stabilization 60 tablet 0  . QUEtiapine (SEROQUEL) 50 MG tablet Take 3 tablets (150 mg total) by mouth at bedtime. For mood control 30 tablet 0  . traZODone (DESYREL) 50 MG tablet Take 1 tablet (50 mg total) by mouth at bedtime. 30 tablet 0   No current facility-administered medications for this visit.     Musculoskeletal: Strength & Muscle Tone: within normal limits Gait & Station: normal Patient leans: N/A  Psychiatric Specialty Exam: Review of Systems  There were no vitals taken for this  visit.There is no height or weight on file to calculate BMI.  General Appearance: Casual  Eye Contact:  Good  Speech:  Clear and Coherent  Volume:  Normal  Mood:  Anxious and Depressed  Affect:  Congruent  Thought Process:  Coherent  Orientation:  Full (Time, Place, and Person)  Thought Content: Logical   Suicidal Thoughts:  No  Homicidal Thoughts:  No  Memory:  Immediate;   Fair Recent;   Fair  Judgement:  Fair  Insight:  Fair  Psychomotor Activity:  Normal  Concentration:  Concentration: Fair  Recall:  [employer] of Knowledge: Fair  Language: Fair  Akathisia:  No  Handed:  Right  AIMS (if indicated):   Assets:  Communication Skills Desire for Improvement Social Support Talents/Skills  ADL's:  Intact  Cognition: WNL  Sleep:  Fair   Screenings: AIMS   Flowsheet Row Admission (Discharged) from 04/22/2020 in [facility]  AIMS Total Score 0    AUDIT   Flowsheet Row Admission (Discharged) from 04/22/2020 in [facility]  Alcohol Use Disorder Identification Test Final Score (AUDIT) 1    GAD-7   Flowsheet Row Video Visit from 04/16/2020 in [facility] Visit from 01/31/2020 in [facility] Visit from 12/19/2019 in [facility] Visit from 06/21/2018 in [facility] Visit from 04/20/2018 in [facility]  Total GAD-7 Score 21 16 21 10 16     PHQ2-9   Flowsheet Row Counselor from 05/21/2020 in [place] [facility] ED from 04/21/2020 in [facility] Video Visit from 04/16/2020 in [facility] Office Visit from 01/31/2020 in [facility] Office Visit from 12/19/2019 in [facility]  PHQ-2 Total Score 5 6 6 4 6   PHQ-9 Total Score 23 24 20 16 25        Assessment and Plan:  Continue partial hospitalization programming Continue medications as directed  Treatment plan was reviewed and agreed upon by NP T. Bobby Rumpf and patient Raegyn Renda need for continued group services   Derrill Center, NP 05/27/2020, 12:59 PM

## 2020-05-27 NOTE — Therapy (Signed)
[place], [place], [address] Phone: [phone]   Fax:  [phone] Virtual Visit via Video Note  I connected with [name] on 05/27/20 at  11:00 AM EST by a video enabled telemedicine application and verified that I am speaking with the correct person using two identifiers.  Location: Patient: Patient Home Provider: Clinic Office    I discussed the limitations of evaluation and management by telemedicine and the availability of in person appointments. The patient expressed understanding and agreed to proceed.   I discussed the assessment and treatment plan with the patient. The patient was provided an opportunity to ask questions and all were answered. The patient agreed with the plan and demonstrated an understanding of the instructions.   The patient was advised to call back or seek an in-person evaluation if the symptoms worsen or if the condition fails to improve as anticipated.  I provided 60 minutes of non-face-to-face time during this encounter.   Ponciano Ort, OT   Occupational Therapy Treatment  Patient Details  Name: [name] MRN: [id number] Date of Birth: 07/15/1980 Referring Provider (OT): Ricky Ala   Encounter Date: 05/27/2020   OT End of Session - 05/27/20 1227    Visit Number 6    Number of Visits 20    Date for OT Re-Evaluation 06/17/20    Authorization Type Aetna 1/1/-05/17/2021   Deduct 1000- 0 met   OOP 3000- 0 met   No Copay Co Ins 20%   Visit limit based on medical necessity for our facility   Visit limit for MD office 60- 0 used ST/OT combined    OT Start Time 1110    OT Stop Time 1210    OT Time Calculation (min) 60 min    Activity Tolerance Patient tolerated treatment well    Behavior During Therapy [facility] for tasks assessed/performed           Past Medical History:  Diagnosis Date  . Headache   . History of bipolar disorder   . History of  pancreatitis   . Hx of varicella   . Migraines   . Vaginal Pap smear, abnormal     Past Surgical History:  Procedure Laterality Date  . CESAREAN SECTION N/A 06/04/2016   Procedure: CESAREAN SECTION;  Surgeon: Tyson Dense, MD;  Location: [facility];  Service: Obstetrics;  Laterality: N/A;  . MOUTH SURGERY     x 2, wisdom teeth, adjust teeth    There were no vitals filed for this visit.   Subjective Assessment - 05/27/20 1226    Currently in Pain? No/denies    Pain Score 0-No pain           OT Education - 05/27/20 1226    Education Details Educated on communication styles including passive, aggressive, and assertive, along with use of XYZ formula to practice assertiveness    Person(s) Educated Patient    Methods Explanation;Handout    Comprehension Verbalized understanding            OT Short Term Goals - 05/22/20 0810      OT SHORT TERM GOAL #1   Status On-going      OT SHORT TERM GOAL #2   Status On-going      OT SHORT TERM GOAL #3   Status On-going      OT SHORT TERM GOAL #4   Status On-going  Group Session:  S: "I definitely communicate more passively with my family, especially my dad, but I really think I fall more in the aggressive style more often than not in regular conversations, well especially now with my ex."  O: Today's group focused on topic of Communication Styles. Group members were educated on the different styles including passive, aggressive, and assertive communication. Members shared and reflected on which style they most often find themselves communicating in and how to transition to a more assertive approach. Benefits and drawbacks of each communication style were discussed and use of the XYZ assertive communication tool was introduced. The XYZ communication tool states: I feel X when you do Y in situation Z and I would like _________. X is the emotion, Y is the specific behavior, and Z is the specific situation. Group  members each formulated their own XYZ statement and shared with the group to discuss and offer feedback.    A: Analleli was active and independent in her participation of discussion and activity, engaging appropriately and actively, despite being the only one present for duration of OT session. She expressed that she communicates most often in an aggressive or passive-aggressive style of communication, however was also able to share examples and scenarios in which she has taken a more passive or even an assertive approach. She appeared interested and receptive in learning about the XYZ formula to increase her assertiveness and spoke about the benefit of using "I" statements when sharing how she feels. Pt expressed intent to work on creating an XYZ statement to share in next session based upon a past situation in which she would have hoped to respond more assertively.   P: Continue to attend PHP OT group sessions 5x week for 2 weeks to promote daily structure, social engagement, and opportunities to develop and utilize adaptive strategies to maximize functional performance in preparation for safe transition and integration back into school, work, and the community. Plan to address topic of assertiveness in next OT group session.    Plan - 05/27/20 1227    Occupational performance deficits (Please refer to evaluation for details): ADL's;IADL's;Rest and Sleep;Education;Work;Leisure;Social Participation    Body Structure / Function / Physical Skills ADL    Cognitive Skills Attention;Consciousness;Emotional;Energy/Drive;Memory;Orientation;Perception;Understand;Thought;Temperament/Personality;Safety Awareness;Problem Solve    Psychosocial Skills Coping Strategies;Environmental  Adaptations;Habits;Interpersonal Interaction;Routines and Behaviors           Patient will benefit from skilled therapeutic intervention in order to improve the following deficits and impairments:   Body Structure / Function /  Physical Skills: ADL Cognitive Skills: Attention,Consciousness,Emotional,Energy/Drive,Memory,Orientation,Perception,Understand,Thought,Temperament/Personality,Safety Awareness,Problem Solve Psychosocial Skills: Coping Strategies,Environmental  Adaptations,Habits,Interpersonal Interaction,Routines and Behaviors   Visit Diagnosis: Difficulty coping  Frontal lobe and executive function deficit  Bipolar 1 disorder, mixed, moderate ([place])    Problem List Patient Active Problem List   Diagnosis Date Noted  . Bipolar 1 disorder ([place]) 04/22/2020  . MDD (major depressive disorder), recurrent episode, severe ([place]) 04/22/2020  . Relationship problems 01/31/2020  . Worsening headaches 10/13/2019  . Paresthesia 10/13/2019  . Upper extremity weakness 10/13/2019  . Other symptoms and signs involving the nervous system 10/13/2019  . 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 Nevus 03/15/2017  . 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 Encounter for insertion of intrauterine contraceptive device (IUD) 08/05/2016  . Iron deficiency anemia 03/20/2016  . Borderline personality disorder ([place]) 07/09/2015  . Generalized anxiety disorder 07/09/2015  . Bipolar 1 disorder, mixed, moderate ([place]) 07/09/2015  . Left breast lump 03/05/2015    05/27/2020  Ponciano Ort, MOT, OTR/L  05/27/2020, 12:27 PM  [place], [place], [address] Phone: [phone]   Fax:  [phone]  Name: TALLEY CASCO MRN: [id number] Date of Birth: 01-11-1981

## 2020-05-28 ENCOUNTER — Encounter (HOSPITAL_COMMUNITY): Payer: Self-pay | Admitting: Psychiatry

## 2020-05-28 ENCOUNTER — Telehealth (INDEPENDENT_AMBULATORY_CARE_PROVIDER_SITE_OTHER): Payer: No Typology Code available for payment source | Admitting: Psychiatry

## 2020-05-28 DIAGNOSIS — Z639 Problem related to primary support group, unspecified: Secondary | ICD-10-CM

## 2020-05-28 DIAGNOSIS — F3162 Bipolar disorder, current episode mixed, moderate: Secondary | ICD-10-CM

## 2020-05-28 DIAGNOSIS — F603 Borderline personality disorder: Secondary | ICD-10-CM | POA: Diagnosis not present

## 2020-05-28 DIAGNOSIS — R4589 Other symptoms and signs involving emotional state: Secondary | ICD-10-CM | POA: Diagnosis not present

## 2020-05-28 DIAGNOSIS — F411 Generalized anxiety disorder: Secondary | ICD-10-CM | POA: Diagnosis not present

## 2020-05-28 DIAGNOSIS — F331 Major depressive disorder, recurrent, moderate: Secondary | ICD-10-CM

## 2020-05-28 MED ORDER — QUETIAPINE FUMARATE 50 MG PO TABS: 150.0000 mg | ORAL_TABLET | Freq: Every day | ORAL | 0 refills | Status: DC

## 2020-05-28 MED ORDER — LAMOTRIGINE 100 MG PO TABS: 100.0000 mg | ORAL_TABLET | Freq: Two times a day (BID) | ORAL | 0 refills | Status: DC
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
## 2020-05-29 ENCOUNTER — Encounter (HOSPITAL_COMMUNITY): Payer: Self-pay

## 2020-05-29 ENCOUNTER — Other Ambulatory Visit (HOSPITAL_COMMUNITY): Payer: No Typology Code available for payment source | Admitting: Occupational Therapy

## 2020-05-29 ENCOUNTER — Other Ambulatory Visit: Payer: Self-pay

## 2020-05-29 ENCOUNTER — Other Ambulatory Visit (HOSPITAL_COMMUNITY): Payer: No Typology Code available for payment source | Admitting: Licensed Clinical Social Worker

## 2020-05-29 DIAGNOSIS — F332 Major depressive disorder, recurrent severe without psychotic features: Secondary | ICD-10-CM

## 2020-05-29 DIAGNOSIS — R41844 Frontal lobe and executive function deficit: Secondary | ICD-10-CM

## 2020-05-29 DIAGNOSIS — F603 Borderline personality disorder: Secondary | ICD-10-CM

## 2020-05-29 DIAGNOSIS — R4589 Other symptoms and signs involving emotional state: Secondary | ICD-10-CM

## 2020-05-29 DIAGNOSIS — F3162 Bipolar disorder, current episode mixed, moderate: Secondary | ICD-10-CM | POA: Diagnosis not present

## 2020-05-29 NOTE — Therapy (Signed)
[facility] PARTIAL HOSPITALIZATION PROGRAM [address] [place], [place], [address] Phone: [phone]   Fax:  [phone] Virtual Visit via Video Note  I connected with [name] on 05/29/20 at  12:00 PM EST by a video enabled telemedicine application and verified that I am speaking with the correct person using two identifiers.  Location: Patient: Patient Home Provider: Clinic Office   I discussed the limitations of evaluation and management by telemedicine and the availability of in person appointments. The patient expressed understanding and agreed to proceed.   I discussed the assessment and treatment plan with the patient. The patient was provided an opportunity to ask questions and all were answered. The patient agreed with the plan and demonstrated an understanding of the instructions.   The patient was advised to call back or seek an in-person evaluation if the symptoms worsen or if the condition fails to improve as anticipated.  I provided 55 minutes of non-face-to-face time during this encounter.   [name], OT   Occupational Therapy Treatment  Patient Details  Name: [name] MRN: [id number] Date of Birth: 06/16/1980 Referring Provider (OT): Hillery Jacks   Encounter Date: 05/29/2020   OT End of Session - 05/29/20 1501    Visit Number 7    Number of Visits 20    Date for OT Re-Evaluation 06/17/20    Authorization Type [employer] 1/1/-05/17/2021   Deduct 1000- 0 met   OOP 3000- 0 met   No Copay Co Ins 20%   Visit limit based on medical necessity for our facility   Visit limit for MD office 60- 0 used ST/OT combined    OT Start Time 1200    OT Stop Time 1255    OT Time Calculation (min) 55 min    Activity Tolerance Patient tolerated treatment well    Behavior During Therapy [facility] for tasks assessed/performed           Past Medical History:  Diagnosis Date  . Headache   . History of bipolar disorder   . History of  pancreatitis   . Hx of varicella   . Migraines   . Vaginal Pap smear, abnormal     Past Surgical History:  Procedure Laterality Date  . CESAREAN SECTION N/A 06/04/2016   Procedure: CESAREAN SECTION;  Surgeon: Ranae Pila, MD;  Location: [facility];  Service: Obstetrics;  Laterality: N/A;  . MOUTH SURGERY     x 2, wisdom teeth, adjust teeth    There were no vitals filed for this visit.   Subjective Assessment - 05/29/20 1501    Currently in Pain? No/denies    Pain Score 0-No pain           OT Education - 05/29/20 1501    Education Details Educated on sleep hygiene and strategies to improve overall sleep quality    Person(s) Educated Patient    Methods Explanation;Handout    Comprehension Verbalized understanding            OT Short Term Goals - 05/22/20 0810      OT SHORT TERM GOAL #1   Status On-going      OT SHORT TERM GOAL #2   Status On-going      OT SHORT TERM GOAL #3   Status On-going      OT SHORT TERM GOAL #4   Status On-going         Group Session:  S: "Some days I can  sleep until noon and other days I am up at 6 am with my daughter. My sleep is hit or miss."  O: Today's group discussion focused on topic of Sleep Hygiene. Patients reflected on the quality of sleep they typically receive and identified areas that need improvement. Group was given background information on sleep and sleep hygiene, including common sleep disorders. Group members also received information on how to improve one's sleep and introduced a sleep diary as a tool that can be utilized to track sleep quality over a length of time. Group session ended with patients identifying one or more strategies they could utilize or implement into their sleep routine in order to improve overall sleep quality.    A: [name] was active in her participation of discussion and activity, sharing areas of her sleep schedule that she has difficulty with. Pt expressed drinking a six pack of  soda every day, however reports she does not think caffeine is a factor in her sleep. She shared that sometimes she gets poor sleep because of her daughter, who wakes her up at 6 AM, however reports that starting trazadone this week as a sleep aid has helped. Pt appeared receptive and open to additional strategies reviewed and appeared motivated to create and keep track of a sleep diary.   P: Continue to attend PHP OT group sessions 5x week for 3 weeks to promote daily structure, social engagement, and opportunities to develop and utilize adaptive strategies to maximize functional performance in preparation for safe transition and integration back into school, work, and the community. Plan to address topic of wellness in next OT group session.    Plan - 05/29/20 1502    Occupational performance deficits (Please refer to evaluation for details): ADL's;IADL's;Rest and Sleep;Education;Work;Leisure;Social Participation    Body Structure / Function / Physical Skills ADL    Cognitive Skills Attention;Consciousness;Emotional;Energy/Drive;Memory;Orientation;Perception;Understand;Thought;Temperament/Personality;Safety Awareness;Problem Solve    Psychosocial Skills Coping Strategies;Environmental  Adaptations;Habits;Interpersonal Interaction;Routines and Behaviors           Patient will benefit from skilled therapeutic intervention in order to improve the following deficits and impairments:   Body Structure / Function / Physical Skills: ADL Cognitive Skills: Attention,Consciousness,Emotional,Energy/Drive,Memory,Orientation,Perception,Understand,Thought,Temperament/Personality,Safety Awareness,Problem Solve Psychosocial Skills: Coping Strategies,Environmental  Adaptations,Habits,Interpersonal Interaction,Routines and Behaviors   Visit Diagnosis: Difficulty coping  Frontal lobe and executive function deficit  Bipolar 1 disorder, mixed, moderate ([place])    Problem List Patient Active Problem List    Diagnosis Date Noted  . Bipolar 1 disorder ([place]) 04/22/2020  . MDD (major depressive disorder), recurrent episode, severe ([place]) 04/22/2020  . Relationship problems 01/31/2020  . Worsening headaches 10/13/2019  . Paresthesia 10/13/2019  . Upper extremity weakness 10/13/2019  . Other symptoms and signs involving the nervous system 10/13/2019  . 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 Nevus 03/15/2017  . 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 Encounter for insertion of intrauterine contraceptive device (IUD) 08/05/2016  . Iron deficiency anemia 03/20/2016  . Borderline personality disorder ([place]) 07/09/2015  . Generalized anxiety disorder 07/09/2015  . Bipolar 1 disorder, mixed, moderate ([place]) 07/09/2015  . Left breast lump 03/05/2015    05/29/2020  Ponciano Ort, MOT, OTR/L  05/29/2020, 3:02 PM  [facility] HOSPITALIZATION PROGRAM [place], [place], [address] Phone: [phone]   Fax:  [phone]  Name: [name] MRN: [id number] Date of Birth: [birth year]

## 2020-05-30 ENCOUNTER — Encounter (HOSPITAL_COMMUNITY): Payer: Self-pay

## 2020-05-30 ENCOUNTER — Other Ambulatory Visit (HOSPITAL_COMMUNITY): Payer: No Typology Code available for payment source | Admitting: Occupational Therapy

## 2020-05-30 ENCOUNTER — Other Ambulatory Visit (HOSPITAL_COMMUNITY): Payer: No Typology Code available for payment source | Admitting: Licensed Clinical Social Worker

## 2020-05-30 ENCOUNTER — Other Ambulatory Visit: Payer: Self-pay

## 2020-05-30 DIAGNOSIS — R41844 Frontal lobe and executive function deficit: Secondary | ICD-10-CM

## 2020-05-30 DIAGNOSIS — R4589 Other symptoms and signs involving emotional state: Secondary | ICD-10-CM

## 2020-05-30 DIAGNOSIS — F603 Borderline personality disorder: Secondary | ICD-10-CM

## 2020-05-30 DIAGNOSIS — F332 Major depressive disorder, recurrent severe without psychotic features: Secondary | ICD-10-CM

## 2020-05-30 DIAGNOSIS — F3162 Bipolar disorder, current episode mixed, moderate: Secondary | ICD-10-CM

## 2020-05-30 NOTE — Therapy (Signed)
[place], [place], [address] Phone: [phone]   Fax:  [phone] Virtual Visit via Video Note  I connected with [name] on 05/30/20 at  11:00 AM EST by a video enabled telemedicine application and verified that I am speaking with the correct person using two identifiers.  Location: Patient: Patient Home Provider: Clinic Office    I discussed the limitations of evaluation and management by telemedicine and the availability of in person appointments. The patient expressed understanding and agreed to proceed.   I discussed the assessment and treatment plan with the patient. The patient was provided an opportunity to ask questions and all were answered. The patient agreed with the plan and demonstrated an understanding of the instructions.   The patient was advised to call back or seek an in-person evaluation if the symptoms worsen or if the condition fails to improve as anticipated.  I provided 60 minutes of non-face-to-face time during this encounter.   Ponciano Ort, OT   Occupational Therapy Treatment  Patient Details  Name: LEWANNA PETRAK MRN: [id number] Date of Birth: [birth year] Referring Provider (OT): Ricky Ala   Encounter Date: 05/30/2020   OT End of Session - 05/30/20 1258    Visit Number 8    Number of Visits 20    Date for OT Re-Evaluation 06/17/20    Authorization Type [employer] 1/1/-05/17/2021   Deduct 1000- 0 met   OOP 3000- 0 met   No Copay Co Ins 20%   Visit limit based on medical necessity for our facility   Visit limit for MD office 60- 0 used ST/OT combined    OT Start Time 1100    OT Stop Time 1200    OT Time Calculation (min) 60 min    Activity Tolerance Patient tolerated treatment well    Behavior During Therapy [facility] for tasks assessed/performed           Past Medical History:  Diagnosis Date  . Headache   . History of bipolar disorder   . History of  pancreatitis   . Hx of varicella   . Migraines   . Vaginal Pap smear, abnormal     Past Surgical History:  Procedure Laterality Date  . CESAREAN SECTION N/A 06/04/2016   Procedure: CESAREAN SECTION;  Surgeon: Tyson Dense, MD;  Location: [place];  Service: Obstetrics;  Laterality: N/A;  . MOUTH SURGERY     x 2, wisdom teeth, adjust teeth    There were no vitals filed for this visit.   Subjective Assessment - 05/30/20 1257    Currently in Pain? No/denies           OT Education - 05/30/20 1257    Education Details Educated on the 5 F's and provided resources/strategies/tips to improve overall health and wellness    Person(s) Educated Patient    Methods Explanation;Handout    Comprehension Verbalized understanding            OT Short Term Goals - 05/22/20 0810      OT SHORT TERM GOAL #1   Status On-going      OT SHORT TERM GOAL #2   Status On-going      OT SHORT TERM GOAL #3   Status On-going      OT SHORT TERM GOAL #4   Status On-going         Group Session:  S: "I hate going outside, I do it  because I have to, but I would much rather stay inside."  O: Today's group session focused on the topic of health and wellness as it relates to the impact on mental health. Discussion focused on identifying the 5 F's to wellness including Food, Fitness, Fresh air, Fellowship, and Friendship with self and soul. Group members identified areas of wellness that they would like to improve upon and were educated and offered various resources. Discussion also focused on how the food we eat impacts our mental health, along with the benefits of engaging in physical activity/exercise and getting outside for fresh air. Discussion wrapped up with group members identifying one area of wellness they could improve upon and identified a strategy to do so.    A: Mariha was active and independent in her participation of discussion and activity, though notably more withdrawn in  today's session than noted previously. Pt shared having a difficult morning, however was receptive to engage when prompted directly by group clinician. She shared that "friendship with herself" is the biggest area of wellness she needs improvement in and spoke about her need to engage in more self-care - ideally sharing that if she had 5-10 minutes to herself each day, she would use it to meditate or reflect in silence as a way to let go of the stress and busy schedule she falls into. Appeared receptive to education and information received throughout session.   P: Continue to attend PHP OT group sessions 5x week for 2 weeks to promote daily structure, social engagement, and opportunities to develop and utilize adaptive strategies to maximize functional performance in preparation for safe transition and integration back into school, work, and the community.   Plan - 05/30/20 1259    Occupational performance deficits (Please refer to evaluation for details): ADL's;IADL's;Rest and Sleep;Education;Work;Leisure;Social Participation    Body Structure / Function / Physical Skills ADL    Cognitive Skills Attention;Consciousness;Emotional;Energy/Drive;Memory;Orientation;Perception;Understand;Thought;Temperament/Personality;Safety Awareness;Problem Solve    Psychosocial Skills Coping Strategies;Environmental  Adaptations;Habits;Interpersonal Interaction;Routines and Behaviors           Patient will benefit from skilled therapeutic intervention in order to improve the following deficits and impairments:   Body Structure / Function / Physical Skills: ADL Cognitive Skills: Attention,Consciousness,Emotional,Energy/Drive,Memory,Orientation,Perception,Understand,Thought,Temperament/Personality,Safety Awareness,Problem Solve Psychosocial Skills: Coping Strategies,Environmental  Adaptations,Habits,Interpersonal Interaction,Routines and Behaviors   Visit Diagnosis: Difficulty coping  Frontal lobe and executive  function deficit  Bipolar 1 disorder, mixed, moderate ([place])    Problem List Patient Active Problem List   Diagnosis Date Noted  . Bipolar 1 disorder ([place]) 04/22/2020  . MDD (major depressive disorder), recurrent episode, severe ([place]) 04/22/2020  . Relationship problems 01/31/2020  . Worsening headaches 10/13/2019  . Paresthesia 10/13/2019  . Upper extremity weakness 10/13/2019  . Other symptoms and signs involving the nervous system 10/13/2019  . 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 Nevus 03/15/2017  . 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 Encounter for insertion of intrauterine contraceptive device (IUD) 08/05/2016  . Iron deficiency anemia 03/20/2016  . Borderline personality disorder ([place]) 07/09/2015  . Generalized anxiety disorder 07/09/2015  . Bipolar 1 disorder, mixed, moderate ([place]) 07/09/2015  . Left breast lump 03/05/2015    05/30/2020  Ponciano Ort, MOT, OTR/L  05/30/2020, 12:59 PM  [place], [place], [address] Phone: [phone]   Fax:  [phone]  Name: SANYE LEDESMA MRN: [id number] Date of Birth: May 01, 1981

## 2020-05-31 ENCOUNTER — Encounter (HOSPITAL_COMMUNITY): Payer: Self-pay

## 2020-05-31 ENCOUNTER — Other Ambulatory Visit: Payer: Self-pay

## 2020-05-31 ENCOUNTER — Other Ambulatory Visit (HOSPITAL_COMMUNITY): Payer: No Typology Code available for payment source | Admitting: Licensed Clinical Social Worker

## 2020-05-31 ENCOUNTER — Other Ambulatory Visit (HOSPITAL_COMMUNITY): Payer: No Typology Code available for payment source | Admitting: Occupational Therapy

## 2020-05-31 DIAGNOSIS — R4589 Other symptoms and signs involving emotional state: Secondary | ICD-10-CM

## 2020-05-31 DIAGNOSIS — F3162 Bipolar disorder, current episode mixed, moderate: Secondary | ICD-10-CM | POA: Diagnosis not present

## 2020-05-31 DIAGNOSIS — F332 Major depressive disorder, recurrent severe without psychotic features: Secondary | ICD-10-CM

## 2020-05-31 DIAGNOSIS — R41844 Frontal lobe and executive function deficit: Secondary | ICD-10-CM

## 2020-05-31 DIAGNOSIS — F603 Borderline personality disorder: Secondary | ICD-10-CM

## 2020-05-31 NOTE — Therapy (Signed)
[name] [place], [place], [address] Phone: [phone]   Fax:  [phone] Virtual Visit via Video Note  I connected with [name] on 05/31/20 at  11:00 AM EST by a video enabled telemedicine application and verified that I am speaking with the correct person using two identifiers.  Location: Patient: Patient Home Provider: Clinic Office    I discussed the limitations of evaluation and management by telemedicine and the availability of in person appointments. The patient expressed understanding and agreed to proceed.   I discussed the assessment and treatment plan with the patient. The patient was provided an opportunity to ask questions and all were answered. The patient agreed with the plan and demonstrated an understanding of the instructions.   The patient was advised to call back or seek an in-person evaluation if the symptoms worsen or if the condition fails to improve as anticipated.  I provided 60 minutes of non-face-to-face time during this encounter.   Ponciano Ort, OT   Occupational Therapy Treatment  Patient Details  Name: [name] MRN: [id number] Date of Birth: 02-28-1981 Referring Provider (OT): Ricky Ala   Encounter Date: 05/31/2020   OT End of Session - 05/31/20 1211    Visit Number 9    Number of Visits 20    Date for OT Re-Evaluation 06/17/20    Authorization Type [employer] 1/1/-05/17/2021   Deduct 1000- 0 met   OOP 3000- 0 met   No Copay Co Ins 20%   Visit limit based on medical necessity for our facility   Visit limit for MD office 60- 0 used ST/OT combined    OT Start Time 1105    OT Stop Time 1205    OT Time Calculation (min) 60 min    Activity Tolerance Patient tolerated treatment well    Behavior During Therapy WFL for tasks assessed/performed           Past Medical History:  Diagnosis Date  . Headache   . History of bipolar disorder   . History of  pancreatitis   . Hx of varicella   . Migraines   . Vaginal Pap smear, abnormal     Past Surgical History:  Procedure Laterality Date  . CESAREAN SECTION N/A 06/04/2016   Procedure: CESAREAN SECTION;  Surgeon: Tyson Dense, MD;  Location: [facility];  Service: Obstetrics;  Laterality: N/A;  . MOUTH SURGERY     x 2, wisdom teeth, adjust teeth    There were no vitals filed for this visit.   Subjective Assessment - 05/31/20 1211    Currently in Pain? No/denies           OT Education - 05/31/20 1211    Education Details Educated on strategies/tips to overcome procrastination and reviewed goal-setting using the SMART goal framework    Person(s) Educated Patient    Methods Explanation;Handout    Comprehension Verbalized understanding            OT Short Term Goals - 05/22/20 0810      OT SHORT TERM GOAL #1   Status On-going      OT SHORT TERM GOAL #2   Status On-going      OT SHORT TERM GOAL #3   Status On-going      OT SHORT TERM GOAL #4   Status On-going         Group Session:  S: "I never set goals, I've always hated  having a time frame to stick to and feeling like I failed."  O: Today's group session encouraged engagement and participation through discussion focused on procrastination and Goal setting. Group members were introduced to goal-setting using the SMART goal framework, identifying goals as Specific, Measurable, Achievable, Relevant, and Time-Bound. Group members took time from group to create their own personal goal reflecting the SMART goal template and shared for review by peers and OT.    A: Virgina was active and independent in her participation of discussion and activity, sharing that her experience with goal-setting is limited, because she has always hated the idea of setting goals and failing them after missing the deadline. She shared that she often feels stress or anxiety around meeting a deadline, therefore does not set goals, however  was receptive and open to learning and practicing the SMART goal framework. Pt identified a short term SMART goal for the weekend "I would like to get all of the birthday supplies ordered for Charlie's birthday by Saturday night." She shared that her daughter's birthday is on Tuesday and wanted to be prepared ahead of the possible snow storm. Receptive to feedback from group clinician and fellow group members.   P: Continue to attend PHP OT group sessions 5x week for 1 weeks to promote daily structure, social engagement, and opportunities to develop and utilize adaptive strategies to maximize functional performance in preparation for safe transition and integration back into school, work, and the community.    Plan - 05/31/20 1212    Occupational performance deficits (Please refer to evaluation for details): ADL's;IADL's;Rest and Sleep;Education;Work;Leisure;Social Participation    Body Structure / Function / Physical Skills ADL    Cognitive Skills Attention;Consciousness;Emotional;Energy/Drive;Memory;Orientation;Perception;Understand;Thought;Temperament/Personality;Safety Awareness;Problem Solve    Psychosocial Skills Coping Strategies;Environmental  Adaptations;Habits;Interpersonal Interaction;Routines and Behaviors           Patient will benefit from skilled therapeutic intervention in order to improve the following deficits and impairments:   Body Structure / Function / Physical Skills: ADL Cognitive Skills: Attention,Consciousness,Emotional,Energy/Drive,Memory,Orientation,Perception,Understand,Thought,Temperament/Personality,Safety Awareness,Problem Solve Psychosocial Skills: Coping Strategies,Environmental  Adaptations,Habits,Interpersonal Interaction,Routines and Behaviors   Visit Diagnosis: Difficulty coping  Frontal lobe and executive function deficit  Bipolar 1 disorder, mixed, moderate ([place])    Problem List Patient Active Problem List   Diagnosis Date Noted  . Bipolar 1  disorder ([place]) 04/22/2020  . MDD (major depressive disorder), recurrent episode, severe ([place]) 04/22/2020  . Relationship problems 01/31/2020  . Worsening headaches 10/13/2019  . Paresthesia 10/13/2019  . Upper extremity weakness 10/13/2019  . Other symptoms and signs involving the nervous system 10/13/2019  . 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 Nevus 03/15/2017  . 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 Encounter for insertion of intrauterine contraceptive device (IUD) 08/05/2016  . Iron deficiency anemia 03/20/2016  . Borderline personality disorder ([place]) 07/09/2015  . Generalized anxiety disorder 07/09/2015  . Bipolar 1 disorder, mixed, moderate ([place]) 07/09/2015  . Left breast lump 03/05/2015    05/31/2020  Ponciano Ort, MOT, OTR/L  05/31/2020, 12:12 PM  [facility] [place], [place], [address] Phone: [phone]   Fax:  [phone]  Name: ANDRA HESLIN MRN: [id number] Date of Birth: Sep 24, 1980

## 2020-06-01 NOTE — Psych (Signed)
Virtual Visit via Video Note  I connected with [name] on 05/20/20 at  9:00 AM EST by a video enabled telemedicine application and verified that I am speaking with the correct person using two identifiers.  Location: Patient: patient home Provider: clinical home office   I discussed the limitations of evaluation and management by telemedicine and the availability of in person appointments. The patient expressed understanding and agreed to proceed.  I discussed the assessment and treatment plan with the patient. The patient was provided an opportunity to ask questions and all were answered. The patient agreed with the plan and demonstrated an understanding of the instructions.   The patient was advised to call back or seek an in-person evaluation if the symptoms worsen or if the condition fails to improve as anticipated.  Pt was provided 240 minutes of non-face-to-face time during this encounter.   Lorin Glass, LCSW    [facility] PHP THERAPIST PROGRESS NOTE  [name] [id number]  Session Time: 9:00 - 10:00  Participation Level: Active  Behavioral Response: CasualAlertDepressed  Type of Therapy: Group Therapy  Treatment Goals addressed: Coping  Interventions: CBT, DBT, Supportive and Reframing  Summary: Clinician led check-in regarding current stressors and situation. Clinician utilized active listening and empathetic response and validated patient emotions. Clinician facilitated processing group on pertinent issues.   Therapist Response: [name] is a 40 y.o. female who presents with depression and mood symptoms. Patient arrived within time allowed and reports that she is feeling "angry." Patient rates hermood at [facility] a scale of 1-10 with 10 being great. Pt reports her weekend was difficult. Pt states a friend died on 09/16/22 and pt struggled with panic attacks throughout the weekend. Pt reports she saw her ex yesterday and has been angry since then, ruminating  on the ways she feels resentment towards him. Pt states she slept approximately 4-5 hours last night and that was the first time she had slept in 4 days. Pt reports experiencing some SI over the weekend and thinking of her daughter to manage the thoughts. Pt able to process. Pt engaged in discussion.      Session Time: 10:00 - 11:00   Participation Level:Active  Behavioral Response:CasualAlertDepressed  Type of Therapy:GroupTherapy  Treatment Goals addressed: Coping  Interventions:CBT, DBT, Supportive and Reframing  Summary:Cln led processing group for pt's current struggles. Group members shared stressors and provided support and feedback. Cln brought in topics of boundaries, healthy relationships, and unhealthy thought processes to inform discussion.  Therapist Response:Pt able to process and provide support to group.       Session Time: 11:00- 12:00  Participation Level:Active  Behavioral Response:CasualAlertDepressed  Type of Therapy: Group Therapy, OT  Treatment Goals addressed: Coping  Interventions:Psychosocial skills training, Supportive  Summary:Occupational Therapy group  Therapist Response:Patient engaged in group. See OT note.      Session Time: 12:00 -1:00  Participation Level:Active  Behavioral Response:CasualAlertDepressed  Type of Therapy:Group therapy  Treatment Goals addressed: Coping  Interventions:CBT; Solution focused; Supportive; Reframing  Summary:12:00 - 12:50: Cln led discussion on healthy aggression substitutes. Cln discussed the benefits to discharging energy and adrenaline when feeling "revved up" in emotion and the importance of balancing that discharge with safety and lack of negative consequences. Group brainstormed different ways to channel agression in a healthy way and shared ways that have worked for them in the past.  12:50 -1:00 Clinician led check-out. Clinician assessed  for immediate needs, medication compliance and efficacy, and safety concerns  Therapist  Response:12:00 - 12:50: Pt engaged in discussion and identifies 3 options for agression substitutions.  12:50 - 1:00: At check-out, patientrates hermood at a5 on a scale of 1-10 with 10 being great.Pt reports afternoon plans ofcoloring. Pt demonstrates some progress as evidenced by participating in first group session. Patient denies SI/HI at the end of group.    Suicidal/Homicidal: Nowithout intent/plan   Plan: Pt will continue in PHP while working to decrease depression symptoms, increase emotion regulation ability, and increase ability to manage symptoms in a healthy manner.   Diagnosis: MDD (major depressive disorder), recurrent severe, without psychosis ([place]) [F33.2]    1. MDD (major depressive disorder), recurrent severe, without psychosis ([place])   2. Borderline personality disorder ([place])       Lorin Glass, LCSW 06/01/2020

## 2020-06-01 NOTE — Psych (Signed)
Virtual Visit via Video Note  I connected with [name] on 05/20/20 at  9:00 AM EST by a video enabled telemedicine application and verified that I am speaking with the correct person using two identifiers.  Location: [name]: [name] home Provider: clinical home office   I discussed the limitations of evaluation and management by telemedicine and the availability of in person appointments. The [name] expressed understanding and agreed to proceed.  I discussed the assessment and treatment plan with the [name]. The [name] was provided an opportunity to ask questions and all were answered. The [name] agreed with the plan and demonstrated an understanding of the instructions.   The [name] was advised to call back or seek an in-person evaluation if the symptoms worsen or if the condition fails to improve as anticipated.  Pt and cln completed treatment plan and pt states alignment with plan. Pt verbalized consent to treatment and group commitments.   I provided 10 minutes of non-face-to-face time during this encounter.   Lorin Glass, LCSW

## 2020-06-01 NOTE — Psych (Signed)
Virtual Visit via Video Note  I connected with [name] on 05/27/20 at  9:00 AM EST by a video enabled telemedicine application and verified that I am speaking with the correct person using two identifiers.  Location: Patient: patient home Provider: clinical home office   I discussed the limitations of evaluation and management by telemedicine and the availability of in person appointments. The patient expressed understanding and agreed to proceed.  I discussed the assessment and treatment plan with the patient. The patient was provided an opportunity to ask questions and all were answered. The patient agreed with the plan and demonstrated an understanding of the instructions.   The patient was advised to call back or seek an in-person evaluation if the symptoms worsen or if the condition fails to improve as anticipated.  Pt was provided 240 minutes of non-face-to-face time during this encounter.   Lorin Glass, LCSW    [facility] PHP THERAPIST PROGRESS NOTE  [name] [id number]  Session Time: 9:00 - 10:00  Participation Level: Active  Behavioral Response: CasualAlertDepressed  Type of Therapy: Individual Therapy  Treatment Goals addressed: Coping  Interventions: CBT, DBT, Supportive and Reframing  Summary: Clinician led check-in regarding current stressors and situation. Clinician utilized active listening and empathetic response and validated patient emotions. Clinician facilitated processing group on pertinent issues.   Therapist Response: [name] is a 40 y.o. female who presents with depression and mood symptoms. Patient arrived within time allowed and reports that she is feeling "positive." Patient rates hermood at a7.5on a scale of 1-10 with 10 being great. Pt reports her weekend went well and she had a good evening last night even without her daughter. Pt reports she had bad dreams last night however slept well. Pt states regular use of a "positive  things" journal that she can see impacting her mood in a good way. Pt able to process. Pt engaged in discussion.      Session Time: 10:00 - 11:00   Participation Level:Active  Behavioral Response:CasualAlertDepressed  Type of Therapy:IndividualTherapy  Treatment Goals addressed: Coping  Interventions:CBT, DBT, Supportive and Reframing  Summary:Cln led discussion on being kind to yourself. Pt shared struggles she experiences with extending herself grace. Cln provided space for pt to process. Cln offered CBT thought challenging exercise, "the best friend test" as a way to calibrate whether she is being fair to herself.   Therapist Response:Pt engaged in discussion and reports severe struggle with being kind to herself and reports a tendency to blame herself when anything is off. Pt reports willingness to utilize the best friend test and is able to practice it with multiple situations within group.        Session Time: 11:00- 12:00  Participation Level:Active  Behavioral Response:CasualAlertDepressed  Type of Therapy: Group Therapy, OT  Treatment Goals addressed: Coping  Interventions:Psychosocial skills training, Supportive  Summary:Occupational Therapy group  Therapist Response:Patient engaged in group. See OT note.      Session Time: 12:00 -1:00  Participation Level:Active  Behavioral Response:CasualAlertDepressed  Type of Therapy:Individual therapy  Treatment Goals addressed: Coping  Interventions:CBT; Solution focused; Supportive; Reframing  Summary:12:00 - 12:50: Cln led discussion on CBT thinking error: catastrophizing. Cln worked with pt to identify examples of catastrophizing and the way it has impacted them. Pt shared the ways in which catstrohpizing tends to affect her and how it leads to spiraling.  12:50 -1:00 Clinician led check-out. Clinician assessed for immediate needs, medication compliance and  efficacy, and safety concerns  Therapist Response:12:00 - 12:50: Pt engaged in discussion and reports understanding of catstrophizing. Pt states catastrophizing tends to be focused around her own abilities or lack of them.   12:50 - 1:00: At check-out, patientrates hermood at [facility] on a scale of 1-10 with 10 being great.Pt reports afternoon plans ofnapping and showering. Pt demonstrates some progress as evidenced by sticking with new skills. Patient denies SI/HI at the end of group. *Pt states she will not be in group tomorrow due to a conflicting appointment.    Suicidal/Homicidal: Nowithout intent/plan   Plan: Pt will continue in PHP while working to decrease depression symptoms, increase emotion regulation ability, and increase ability to manage symptoms in a healthy manner.   Diagnosis: MDD (major depressive disorder), recurrent severe, without psychosis ([place]) [F33.2]    1. MDD (major depressive disorder), recurrent severe, without psychosis ([place])   2. Generalized anxiety disorder       Lorin Glass, LCSW 06/01/2020

## 2020-06-01 NOTE — Psych (Signed)
Virtual Visit via Video Note  I connected with [name] on 05/21/20 at  9:00 AM EST by a video enabled telemedicine application and verified that I am speaking with the correct person using two identifiers.  Location: Patient: patient home Provider: clinical home office   I discussed the limitations of evaluation and management by telemedicine and the availability of in person appointments. The patient expressed understanding and agreed to proceed.  I discussed the assessment and treatment plan with the patient. The patient was provided an opportunity to ask questions and all were answered. The patient agreed with the plan and demonstrated an understanding of the instructions.   The patient was advised to call back or seek an in-person evaluation if the symptoms worsen or if the condition fails to improve as anticipated.  Pt was provided 240 minutes of non-face-to-face time during this encounter.   Lorin Glass, LCSW    [facility] [place] PHP THERAPIST PROGRESS NOTE  SEIRRA KOS [id number]  Session Time: 9:00 - 10:00  Participation Level: Active  Behavioral Response: CasualAlertDepressed  Type of Therapy: Group Therapy  Treatment Goals addressed: Coping  Interventions: CBT, DBT, Supportive and Reframing  Summary: Clinician led check-in regarding current stressors and situation. Clinician utilized active listening and empathetic response and validated patient emotions. Clinician facilitated processing group on pertinent issues.   Therapist Response: ADRIANNAH STEINKAMP is a 40 y.o. female who presents with depression and mood symptoms. Patient arrived within time allowed and reports that she is feeling "anxious." Patient rates hermood at [facility] a scale of 1-10 with 10 being great. Pt reports continued struggles with rumination and anger regarding her ex. Pt states when she does not have her daughter it is a struggle to fall into her thoughts. Pt states she took a nap, colored, and read  to attempt to cope. Pt reports sleeping less than 1 hour last night due to racing thoughts.  Pt able to process. Pt engaged in discussion.      Session Time: 10:00 - 11:00   Participation Level:Active  Behavioral Response:CasualAlertDepressed  Type of Therapy:GroupTherapy  Treatment Goals addressed: Coping  Interventions:CBT, DBT, Supportive and Reframing  Summary:Cln led processing group for pt's current struggles. Group members shared stressors and provided support and feedback. Cln brought in topics of boundaries, healthy relationships, and unhealthy thought processes to inform discussion.   Therapist Response:Pt able to process and provide support to group.      Session Time: 11:00- 12:00  Participation Level:Active  Behavioral Response:CasualAlertDepressed  Type of Therapy:Group therapy  Treatment Goals addressed: Coping  Interventions:CBT, DBT, Supportive and Reframing  Summary: Cln led discussion on negative thought processes and how they can distort the way in which we view reality. Group members shared struggles they have experienced in relationships and the negative self-talk that followed. Cln introduced CBT thought challenging principles and group worked to challenge negative thoughts shared.   Therapist Response:Pt engaged in discussion and is able to process.       Session Time: 12:00 -1:00  Participation Level:Active  Behavioral Response:CasualAlertDepressed  Type of Therapy:Group therapy  Treatment Goals addressed: Coping  Interventions:Psychosocial skills training, Supportive  Summary:12:00 - 12:50:Occupational Therapy group 12:50 -1:00 Clinician led check-out. Clinician assessed for immediate needs, medication compliance and efficacy, and safety concerns  Therapist Response:12:00 - 12:50:Patient engaged in group. See OT note.   12:50 - 1:00: At check-out, patientrates hermood at a5.5  on a scale of 1-10 with 10 being great.Pt reports afternoon plans ofcoloring and watching  a movie. Pt demonstrates some progress as evidenced by attempting aggression substitutes. Patient denies SI/HI at the end of group.    Suicidal/Homicidal: Nowithout intent/plan   Plan: Pt will continue in PHP while working to decrease depression symptoms, increase emotion regulation ability, and increase ability to manage symptoms in a healthy manner.   Diagnosis: MDD (major depressive disorder), recurrent severe, without psychosis ([place]) [F33.2]    1. MDD (major depressive disorder), recurrent severe, without psychosis ([place])   2. Bipolar 1 disorder, mixed, moderate ([place])       Lorin Glass, LCSW 06/01/2020

## 2020-06-02 NOTE — Psych (Signed)
Virtual Visit via Video Note  I connected with [name] on 05/30/20 at  9:00 AM EST by a video enabled telemedicine application and verified that I am speaking with the correct person using two identifiers.  Location: Patient: patient home Provider: clinical home office   I discussed the limitations of evaluation and management by telemedicine and the availability of in person appointments. The patient expressed understanding and agreed to proceed.  I discussed the assessment and treatment plan with the patient. The patient was provided an opportunity to ask questions and all were answered. The patient agreed with the plan and demonstrated an understanding of the instructions.   The patient was advised to call back or seek an in-person evaluation if the symptoms worsen or if the condition fails to improve as anticipated.  Pt was provided 240 minutes of non-face-to-face time during this encounter.   Lorin Glass, LCSW    [facility] PHP THERAPIST PROGRESS NOTE  [name] [id number]  Session Time: 9:00 - 10:00  Participation Level: Active  Behavioral Response: CasualAlertDepressed  Type of Therapy: Group Therapy  Treatment Goals addressed: Coping  Interventions: CBT, DBT, Supportive and Reframing  Summary: Clinician led check-in regarding current stressors and situation. Clinician utilized active listening and empathetic response and validated patient emotions. Clinician facilitated processing group on pertinent issues.   Therapist Response: [name] is a 40 y.o. female who presents with depression and mood symptoms. Patient arrived within time allowed and reports that she is feeling "not great." Patient rates hermood at [facility] a scale of 1-10 with 10 being great. Pt reports she slept poorly and had bad dreams. Pt states she felt overwhelmed yesterday and that lead to resentment and anger. Pt reports she spiraled and cried. Pt able to process. Pt engaged in  discussion.      Session Time: 10:00 - 11:00   Participation Level:Active  Behavioral Response:CasualAlertDepressed  Type of Therapy:GroupTherapy  Treatment Goals addressed: Coping  Interventions:CBT, DBT, Supportive and Reframing  Summary:Cln led discussion on control and the way it impacts our lives. Group members shared struggles and worked to identify the way in which control is contributing to the struggle. Cln utilized CBT thought challenging and the Catch-Challenge-Change model to address their control issues.   Therapist Response: Pt engaged in discussion and is able to determine ways in which control is an issue for her and brainstormed how to address it in a healthy manner.      Session Time: 11:00- 12:00  Participation Level:Active  Behavioral Response:CasualAlertDepressed  Type of Therapy: Group Therapy, OT  Treatment Goals addressed: Coping  Interventions:Psychosocial skills training, Supportive  Summary:Occupational Therapy group  Therapist Response:Patient engaged in group. See OT note.      Session Time: 12:00 -1:00  Participation Level:Active  Behavioral Response:CasualAlertDepressed  Type of Therapy:Group therapy  Treatment Goals addressed: Coping  Interventions:CBT; Solution focused; Supportive; Reframing  Summary:12:00 - 12:50: Cln continued topic of DBT distress tolerance skills and introduced the ACCEPTS distraction skills. Group reviewed A-C-C-E-P skills and discussed how they can practice them in their every day life.  12:50 -1:00 Clinician led check-out. Clinician assessed for immediate needs, medication compliance and efficacy, and safety concerns  Therapist Response:12:00 - 12:50: Pt engaged in discussion and reports she is most likely to practice the "E" skill.  12:50 - 1:00: At check-out, patientrates hermood at a5 on a scale of 1-10 with 10 being great.Pt reports afternoon  plans ofspending time with her friend. Pt demonstrates some progress  as evidenced by increased insight. Patient denies SI/HI at the end of group.    Suicidal/Homicidal: Nowithout intent/plan   Plan: Pt will continue in PHP while working to decrease depression symptoms, increase emotion regulation ability, and increase ability to manage symptoms in a healthy manner.   Diagnosis: MDD (major depressive disorder), recurrent severe, without psychosis ([place]) [F33.2]    1. MDD (major depressive disorder), recurrent severe, without psychosis ([place])   2. Borderline personality disorder ([place])       Lorin Glass, LCSW 06/02/2020

## 2020-06-02 NOTE — Psych (Signed)
Virtual Visit via Video Note  I connected with [name] on 05/29/20 at  9:00 AM EST by a video enabled telemedicine application and verified that I am speaking with the correct person using two identifiers.  Location: Patient: patient home Provider: clinical home office   I discussed the limitations of evaluation and management by telemedicine and the availability of in person appointments. The patient expressed understanding and agreed to proceed.  I discussed the assessment and treatment plan with the patient. The patient was provided an opportunity to ask questions and all were answered. The patient agreed with the plan and demonstrated an understanding of the instructions.   The patient was advised to call back or seek an in-person evaluation if the symptoms worsen or if the condition fails to improve as anticipated.  Pt was provided 240 minutes of non-face-to-face time during this encounter.   Lorin Glass, LCSW    [facility] PHP THERAPIST PROGRESS NOTE  [name] [id number]  Session Time: 9:00 - 10:00  Participation Level: Active  Behavioral Response: CasualAlertDepressed  Type of Therapy: Group Therapy  Treatment Goals addressed: Coping  Interventions: CBT, DBT, Supportive and Reframing  Summary: Clinician led check-in regarding current stressors and situation. Clinician utilized active listening and empathetic response and validated patient emotions. Clinician facilitated processing group on pertinent issues.   Therapist Response: [name] is a 40 y.o. female who presents with depression and mood symptoms. Patient arrived within time allowed and reports that she is feeling "pretty good." Patient rates hermood at [facility] a scale of 1-10 with 10 being great. Pt reports she was able to sleep in and is feeling rested. Pt reports she had a good day yesterday and was able to do tasks just for herself and it felt good. Pt struggles with mood dependent thinking. Pt  able to process. Pt engaged in discussion.      Session Time: 10:00 - 11:00   Participation Level:Active  Behavioral Response:CasualAlertDepressed  Type of Therapy:GroupTherapy  Treatment Goals addressed: Coping  Interventions:CBT, DBT, Supportive and Reframing  Summary:Cln introduced DBT distress tolerance distraction skills. Cln provides context for distraction skills and why distraction is a foundational way to manage mood dysregulation. Group discussed how to apply distraction.   Therapist Response:Pt engaged in discussion and reports understanding of how distraction can be applied to manage big feelings.       Session Time: 11:00- 12:00  Participation Level:Active  Behavioral Response:CasualAlertDepressed  Type of Therapy:Group therapy  Treatment Goals addressed: Coping  Interventions:Supportive, Reframing  Summary:Spiritual Care group  Therapist Response:Pt engaged in session. See chaplain note      Session Time: 12:00 -1:00  Participation Level:Active  Behavioral Response:CasualAlertDepressed  Type of Therapy:Group therapy  Treatment Goals addressed: Coping  Interventions:Psychosocial skills training, Supportive  Summary:12:00 - 12:50:Occupational Therapy group 12:50 -1:00 Clinician led check-out. Clinician assessed for immediate needs, medication compliance and efficacy, and safety concerns  Therapist Response:12:00 - 12:50:Patient engaged in group. See OT note.   12:50 - 1:00: At check-out, patientrates hermood at [facility] on a scale of 1-10 with 10 being great.Pt reports afternoon plans ofspending time with her daughter. Pt demonstrates some progress as evidenced by increased self-care efforts. Patient denies SI/HI at the end of group.    Suicidal/Homicidal: Nowithout intent/plan   Plan: Pt will continue in PHP while working to decrease depression symptoms, increase emotion regulation  ability, and increase ability to manage symptoms in a healthy manner.   Diagnosis: MDD (major depressive disorder), recurrent  severe, without psychosis ([place]) [F33.2]    1. MDD (major depressive disorder), recurrent severe, without psychosis ([place])   2. Borderline personality disorder ([place])       Lorin Glass, LCSW 06/02/2020

## 2020-06-02 NOTE — Psych (Signed)
Virtual Visit via Video Note  I connected with [name] on 05/23/20 at  9:00 AM EST by a video enabled telemedicine application and verified that I am speaking with the correct person using two identifiers.  Location: Patient: patient home Provider: clinical home office   I discussed the limitations of evaluation and management by telemedicine and the availability of in person appointments. The patient expressed understanding and agreed to proceed.  I discussed the assessment and treatment plan with the patient. The patient was provided an opportunity to ask questions and all were answered. The patient agreed with the plan and demonstrated an understanding of the instructions.   The patient was advised to call back or seek an in-person evaluation if the symptoms worsen or if the condition fails to improve as anticipated.  Pt was provided 240 minutes of non-face-to-face time during this encounter.   Lorin Glass, LCSW    [facility] PHP THERAPIST PROGRESS NOTE  [name] [id number]  Session Time: 9:00 - 10:00  Participation Level: Active  Behavioral Response: CasualAlertDepressed  Type of Therapy: Group Therapy  Treatment Goals addressed: Coping  Interventions: CBT, DBT, Supportive and Reframing  Summary: Clinician led check-in regarding current stressors and situation. Clinician utilized active listening and empathetic response and validated patient emotions. Clinician facilitated processing group on pertinent issues.   Therapist Response: [name] is a 40 y.o. female who presents with depression and mood symptoms. Patient arrived within time allowed and reports that she is feeling "good about today." Patient rates hermood at a6.5on a scale of 1-10 with 10 being great. Pt reports she is feeling frustrated because her daughter's daycare is closed for two weeks. Pt states she is feeling proud of herself because she had two "normal" conversations with her ex about  their daughter.  Pt able to process. Pt engaged in discussion.      Session Time: 10:00 - 11:00   Participation Level:Active  Behavioral Response:CasualAlertDepressed  Type of Therapy:GroupTherapy  Treatment Goals addressed: Coping  Interventions:CBT, DBT, Supportive and Reframing  Summary:Cln introduced topic of DBT distress tolerance skill: the STOP skill.  Group discussed ways they could apply the STOP skill in their every day life. Cln made connection to spiraling, anger, negative thinking, and staying positive as situations in which the STOP skill can be helpful.  Therapist Response: Pt engaged in discussion and is able to identify when they can apply the STOP skill to help them.       Session Time: 11:00- 12:00  Participation Level:Active  Behavioral Response:CasualAlertDepressed  Type of Therapy: Group Therapy, OT  Treatment Goals addressed: Coping  Interventions:Psychosocial skills training, Supportive  Summary:Occupational Therapy group  Therapist Response:Patient engaged in group. See OT note.      Session Time: 12:00 -1:00  Participation Level:Active  Behavioral Response:CasualAlertDepressed  Type of Therapy:Group therapy  Treatment Goals addressed: Coping  Interventions:CBT; Solution focused; Supportive; Reframing  Summary:12:00 - 12:50: Cln introduced topic of CBT cognitive distortions. Cln discussed unhealthy thought patterns and how our thoughts shape our reality and irrational thoughts can alter our perspective. Cln utilized handout "cognitive distortions" to discuss common examples of distorted thoughts and group members worked to identify examples in their own life.  12:50 -1:00 Clinician led check-out. Clinician assessed for immediate needs, medication compliance and efficacy, and safety concerns  Therapist Response:12:00 - 12:50: Pt engaged in discussion and is able to determine examples of  distorted thinking in their own life.  12:50 - 1:00: At check-out, patientrates  hermood at a7.5 on a scale of 1-10 with 10 being great.Pt reports afternoon plans ofspending time with her daughter. Pt demonstrates some progress as evidenced by increased emotion regulation. Patient denies SI/HI at the end of group.    Suicidal/Homicidal: Nowithout intent/plan   Plan: Pt will continue in PHP while working to decrease depression symptoms, increase emotion regulation ability, and increase ability to manage symptoms in a healthy manner.   Diagnosis: MDD (major depressive disorder), recurrent severe, without psychosis ([place]) [F33.2]    1. MDD (major depressive disorder), recurrent severe, without psychosis ([place])   2. Borderline personality disorder ([place])       Lorin Glass, LCSW 06/02/2020

## 2020-06-02 NOTE — Psych (Signed)
Virtual Visit via Video Note  I connected with [name] on 05/31/20 at  9:00 AM EST by a video enabled telemedicine application and verified that I am speaking with the correct person using two identifiers.  Location: Patient: patient home Provider: clinical home office   I discussed the limitations of evaluation and management by telemedicine and the availability of in person appointments. The patient expressed understanding and agreed to proceed.  I discussed the assessment and treatment plan with the patient. The patient was provided an opportunity to ask questions and all were answered. The patient agreed with the plan and demonstrated an understanding of the instructions.   The patient was advised to call back or seek an in-person evaluation if the symptoms worsen or if the condition fails to improve as anticipated.  Pt was provided 240 minutes of non-face-to-face time during this encounter.   Lorin Glass, LCSW    [facility] PHP THERAPIST PROGRESS NOTE  [name] [id number]  Session Time: 9:00 - 10:00  Participation Level: Active  Behavioral Response: CasualAlertDepressed  Type of Therapy: Group Therapy  Treatment Goals addressed: Coping  Interventions: CBT, DBT, Supportive and Reframing  Summary: Clinician led check-in regarding current stressors and situation. Clinician utilized active listening and empathetic response and validated patient emotions. Clinician facilitated processing group on pertinent issues.   Therapist Response: [name] is a 40 y.o. female who presents with depression and mood symptoms. Patient arrived within time allowed and reports that she is feeling "anxious." Patient rates hermood at a5.5on a scale of 1-10 with 10 being great. Pt reports she had a good time with her best friend yesterday. Pt states struggling with bizarre dreams last night. Pt reports feeling fatigued by her feelings.  Pt able to process. Pt engaged in  discussion.      Session Time: 10:00 - 11:00   Participation Level:Active  Behavioral Response:CasualAlertDepressed  Type of Therapy:GroupTherapy  Treatment Goals addressed: Coping  Interventions:CBT, DBT, Supportive and Reframing  Summary:Cln led discussion on "spiraling" or when our thoughts compound on one another to descend our feelings into worse and worse situations. Group members discussed the ways in which spiraling is difficult for them and when they are especially vulnerable. Cln offered DBT distraction skills as a way to halt the spiral once you recognize it.   Therapist Response: Pt engaged in discussion and reports spiraling is a major issue for her. Pt is able to increase awareness of spiraling throughout the discussion.      Session Time: 11:00- 12:00  Participation Level:Active  Behavioral Response:CasualAlertDepressed  Type of Therapy: Group Therapy, OT  Treatment Goals addressed: Coping  Interventions:Psychosocial skills training, Supportive  Summary:Occupational Therapy group  Therapist Response:Patient engaged in group. See OT note.      Session Time: 12:00 -1:00  Participation Level:Active  Behavioral Response:CasualAlertDepressed  Type of Therapy:Group therapy  Treatment Goals addressed: Coping  Interventions:CBT; Solution focused; Supportive; Reframing  Summary:12:00 - 12:50: Cln continued topic of DBT distress tolerance skills and the ACCEPTS distraction skill. Group reviewed T-S skills and discussed how they can practice them in their every day life. 12:50 -1:00 Clinician led check-out. Clinician assessed for immediate needs, medication compliance and efficacy, and safety concerns  Therapist Response:12:00 - 12:50: Pt engaged in discussion and reports she is most likely to practice the "T" skill.  12:50 - 1:00: At check-out, patientrates hermood at a5.5 on a scale of 1-10 with 10 being  great.Pt reports afternoon plans ofhanging out with her  daughter. Pt demonstrates some progress as evidenced by sticking with difficult work. Patient denies SI/HI at the end of group.    Suicidal/Homicidal: Nowithout intent/plan   Plan: Pt will continue in PHP while working to decrease depression symptoms, increase emotion regulation ability, and increase ability to manage symptoms in a healthy manner.   Diagnosis: MDD (major depressive disorder), recurrent severe, without psychosis ([place]) [F33.2]    1. MDD (major depressive disorder), recurrent severe, without psychosis ([place])   2. Borderline personality disorder ([place])       Lorin Glass, LCSW 06/02/2020

## 2020-06-02 NOTE — Psych (Signed)
Virtual Visit via Video Note  I connected with [name] on 05/24/20 at  9:00 AM EST by a video enabled telemedicine application and verified that I am speaking with the correct person using two identifiers.  Location: Patient: patient home Provider: clinical home office   I discussed the limitations of evaluation and management by telemedicine and the availability of in person appointments. The patient expressed understanding and agreed to proceed.  I discussed the assessment and treatment plan with the patient. The patient was provided an opportunity to ask questions and all were answered. The patient agreed with the plan and demonstrated an understanding of the instructions.   The patient was advised to call back or seek an in-person evaluation if the symptoms worsen or if the condition fails to improve as anticipated.  Pt was provided 240 minutes of non-face-to-face time during this encounter.   Lorin Glass, LCSW    [facility] PHP THERAPIST PROGRESS NOTE  OLIA HINDERLITER [id number]  Session Time: 9:00 - 10:00  Participation Level: Active  Behavioral Response: CasualAlertDepressed  Type of Therapy: Group Therapy  Treatment Goals addressed: Coping  Interventions: CBT, DBT, Supportive and Reframing  Summary: Clinician led check-in regarding current stressors and situation. Clinician utilized active listening and empathetic response and validated patient emotions. Clinician facilitated processing group on pertinent issues.   Therapist Response: REBECAH DANGERFIELD is a 40 y.o. female who presents with depression and mood symptoms. Patient arrived within time allowed and reports that she is feeling "groggy." Patient rates hermood at a6.5on a scale of 1-10 with 10 being great. Pt reports slept 10 hours last night and is feeling rested. Pt states she spent time with her daughter. Pt reports having some neutral conversations with her ex and then it becoming heated. Pt continues to  struggle with managing feelings around her breakup. Pt able to process. Pt engaged in discussion.      Session Time: 10:00 - 11:00   Participation Level:Active  Behavioral Response:CasualAlertDepressed  Type of Therapy:GroupTherapy  Treatment Goals addressed: Coping  Interventions:CBT, DBT, Supportive and Reframing  Summary:Cln led discussion on feelings and the role they play for our lives. Cln contextualized feelings as a warning system that brings attention to areas of our lives that need focus. Group members discussed how to pay attention to what the feeling is telling us versus reacting to unpleasant aspects of a feeling.   Therapist Response: Pt engaged in discussion and reports struggling with being reactionary especially with anger.      Session Time: 11:00- 12:00  Participation Level:Active  Behavioral Response:CasualAlertDepressed  Type of Therapy: Group Therapy, OT  Treatment Goals addressed: Coping  Interventions:Psychosocial skills training, Supportive  Summary:Occupational Therapy group  Therapist Response:Patient engaged in group. See OT note.      Session Time: 12:00 -1:00  Participation Level:Active  Behavioral Response:CasualAlertDepressed  Type of Therapy:Group therapy  Treatment Goals addressed: Coping  Interventions:CBT; Solution focused; Supportive; Reframing  Summary:12:00 - 12:50: Cln led discussion on CBT thinking error: personalization. Cln worked with group members to identify examples of personalization and the consequences that can come form engaging. Group shared ways in which personalizing has been an inssue for them and barriers to working on it.  12:50 -1:00 Clinician led check-out. Clinician assessed for immediate needs, medication compliance and efficacy, and safety concerns  Therapist Response:12:00 - 12:50: Pt engaged in discussion and reports understanding of  personalization and how it affects them negatively. 12:50 - 1:00: At check-out, patientrates hermood at a6.5  on a scale of 1-10 with 10 being great.Pt reports afternoon plans ofplaying outside. Pt demonstrates some progress as evidenced by sticking with new behaviors. Patient denies SI/HI at the end of group.    Suicidal/Homicidal: Nowithout intent/plan   Plan: Pt will continue in PHP while working to decrease depression symptoms, increase emotion regulation ability, and increase ability to manage symptoms in a healthy manner.   Diagnosis: MDD (major depressive disorder), recurrent severe, without psychosis ([place]) [F33.2]    1. MDD (major depressive disorder), recurrent severe, without psychosis ([place])   2. Borderline personality disorder ([place])       Lorin Glass, LCSW 06/02/2020

## 2020-06-02 NOTE — Psych (Signed)
Virtual Visit via Video Note  I connected with [name] on 05/22/20 at  9:00 AM EST by a video enabled telemedicine application and verified that I am speaking with the correct person using two identifiers.  Location: Patient: patient home Provider: clinical home office   I discussed the limitations of evaluation and management by telemedicine and the availability of in person appointments. The patient expressed understanding and agreed to proceed.  I discussed the assessment and treatment plan with the patient. The patient was provided an opportunity to ask questions and all were answered. The patient agreed with the plan and demonstrated an understanding of the instructions.   The patient was advised to call back or seek an in-person evaluation if the symptoms worsen or if the condition fails to improve as anticipated.  Pt was provided 240 minutes of non-face-to-face time during this encounter.   Lorin Glass, LCSW    [facility] PHP THERAPIST PROGRESS NOTE  ADALYN PENNOCK [id number]  Session Time: 9:00 - 10:00  Participation Level: Active  Behavioral Response: CasualAlertDepressed  Type of Therapy: Group Therapy  Treatment Goals addressed: Coping  Interventions: CBT, DBT, Supportive and Reframing  Summary: Clinician led check-in regarding current stressors and situation. Clinician utilized active listening and empathetic response and validated patient emotions. Clinician facilitated processing group on pertinent issues.   Therapist Response: ZENIA GUEST is a 40 y.o. female who presents with depression and mood symptoms. Patient arrived within time allowed and reports that she is feeling "anxious." Patient rates hermood at [facility] a scale of 1-10 with 10 being great. Pt reports she had a "rough night" due to continued ruminations. Pt states sleeping approximately 1-2 hours because she "couldn't get out of my head." Pt experienced some hopelessness and watched a movie and  talked to her mom to help manage the symptoms. Pt struggles with sticking with coping skills. Pt able to process. Pt engaged in discussion.      Session Time: 10:00 - 11:00   Participation Level:Active  Behavioral Response:CasualAlertDepressed  Type of Therapy:GroupTherapy  Treatment Goals addressed: Coping  Interventions:CBT, DBT, Supportive and Reframing  Summary:Cln led discussion on the CBT cognitive distortion, emotional reasoning. Group members discussed ways they struggle to move past their feelings or confuse feelings with reason. Cln offered context for cognitive distortions and thought challenging as a way to manage the distorted thoughts.   Therapist Response:Pt engaged in discussion and is able to discuss struggles with emotional reasoning and its impact.      Session Time: 11:00- 12:00  Participation Level:Active  Behavioral Response:CasualAlertDepressed  Type of Therapy:Group therapy  Treatment Goals addressed: Coping  Interventions:Supportive, Reframing  Summary:Spiritual Care group  Therapist Response:Pt engaged in session. See chaplain note      Session Time: 12:00 -1:00  Participation Level:Active  Behavioral Response:CasualAlertDepressed  Type of Therapy:Group therapy  Treatment Goals addressed: Coping  Interventions:Psychosocial skills training, Supportive  Summary:12:00 - 12:50:Occupational Therapy group 12:50 -1:00 Clinician led check-out. Clinician assessed for immediate needs, medication compliance and efficacy, and safety concerns  Therapist Response:12:00 - 12:50:Patient engaged in group. See OT note.   12:50 - 1:00: At check-out, patientrates hermood at a6.5 on a scale of 1-10 with 10 being great.Pt reports afternoon plans ofgoing on a walk and spending time with her daughter. Pt demonstrates some progress as evidenced by efforts to use skills. Patient denies SI/HI at  the end of group.    Suicidal/Homicidal: Nowithout intent/plan   Plan: Pt will continue in PHP while  working to decrease depression symptoms, increase emotion regulation ability, and increase ability to manage symptoms in a healthy manner.   Diagnosis: MDD (major depressive disorder), recurrent severe, without psychosis ([place]) [F33.2]    1. MDD (major depressive disorder), recurrent severe, without psychosis ([place])   2. Borderline personality disorder ([place])       Lorin Glass, LCSW 06/02/2020

## 2020-06-04 ENCOUNTER — Other Ambulatory Visit (HOSPITAL_COMMUNITY): Payer: No Typology Code available for payment source | Admitting: Occupational Therapy

## 2020-06-04 ENCOUNTER — Other Ambulatory Visit (HOSPITAL_COMMUNITY): Payer: No Typology Code available for payment source | Admitting: Licensed Clinical Social Worker

## 2020-06-04 ENCOUNTER — Encounter (HOSPITAL_COMMUNITY): Payer: Self-pay

## 2020-06-04 ENCOUNTER — Encounter (HOSPITAL_COMMUNITY): Payer: Self-pay | Admitting: Family

## 2020-06-04 ENCOUNTER — Other Ambulatory Visit: Payer: Self-pay

## 2020-06-04 DIAGNOSIS — F3162 Bipolar disorder, current episode mixed, moderate: Secondary | ICD-10-CM

## 2020-06-04 DIAGNOSIS — F332 Major depressive disorder, recurrent severe without psychotic features: Secondary | ICD-10-CM

## 2020-06-04 DIAGNOSIS — R41844 Frontal lobe and executive function deficit: Secondary | ICD-10-CM

## 2020-06-04 DIAGNOSIS — F603 Borderline personality disorder: Secondary | ICD-10-CM

## 2020-06-04 DIAGNOSIS — R4589 Other symptoms and signs involving emotional state: Secondary | ICD-10-CM

## 2020-06-04 NOTE — Progress Notes (Signed)
Virtual Visit via Video Note  I connected with [name] on 06/04/20 at  9:00 AM EST by a video enabled telemedicine application and verified that I am speaking with the correct person using two identifiers.  Location: Patient: home Provider: Office   I discussed the limitations of evaluation and management by telemedicine and the availability of in person appointments. The patient expressed understanding and agreed to proceed.   I discussed the assessment and treatment plan with the patient. The patient was provided an opportunity to ask questions and all were answered. The patient agreed with the plan and demonstrated an understanding of the instructions.   The patient was advised to call back or seek an in-person evaluation if the symptoms worsen or if the condition fails to improve as anticipated.  I provided 15 minutes of non-face-to-face time during this encounter.   Derrill Center, NP   BH MD/PA/NP OP Progress Note  06/04/2020 10:32 AM [name]  MRN:  [id number]  Evaluation: [name] was seen and evaluated via tele-assessment.  She presents flat,depressed and tearful.  Reported suicidal thoughts with a plan over the weekend due to a discussion between she and her mother.  States she was able to utilize coping skills as she was able to reach out to her ex-boyfriend and a close friend.  Reports overall her friends and family have been supportive.  Reports she continues to reside with her parents as they are monitoring her for safety.  Discussed safety planning and crisis hotline.  Patient was receptive.  Patient to continue partial hospitalization programming with plans to step down to intensive outpatient programming.  Staff to continue to monitor for safety.   [name] is currently she is denying suicidal or homicidal ideations.  Denies auditory or visual hallucinations.  Reports taking and tolerating medications well.  Reports a good appetite. Reported  vivid dreams when taking  trazodone however, she would like to continue medications due to benefits of  "sound sleep."  Discussed adjusting and/or changing medication.  Patient declined.  Support, encouragement and reassurance was provided.    Visit Diagnosis: No diagnosis found.  Past Psychiatric History:   Past Medical History:  Past Medical History:  Diagnosis Date  . Headache   . History of bipolar disorder   . History of pancreatitis   . Hx of varicella   . Migraines   . Vaginal Pap smear, abnormal     Past Surgical History:  Procedure Laterality Date  . CESAREAN SECTION N/A 06/04/2016   Procedure: CESAREAN SECTION;  Surgeon: Tyson Dense, MD;  Location: [place];  Service: Obstetrics;  Laterality: N/A;  . MOUTH SURGERY     x 2, wisdom teeth, adjust teeth    Family Psychiatric History:   Family History:  Family History  Problem Relation Age of Onset  . Stroke Father   . Heart attack Father   . Cancer Maternal Aunt        stomach  . Cancer Maternal Grandmother        ovarian  . Headache Mother   . Migraines Brother   . Depression Paternal Grandmother     Social History:  Social History   Socioeconomic History  . Marital status: Single    Spouse name: Not on file  . Number of children: 1  . Years of education: Not on file  . Highest education level: High school graduate  Occupational History  . Not on file  Tobacco Use  . Smoking  status: Former Smoker    Packs/day: 0.50    Types: Cigarettes    Quit date: 09/16/2015    Years since quitting: 4.7  . Smokeless tobacco: Never Used  Vaping Use  . Vaping Use: Never used  Substance and Sexual Activity  . Alcohol use: Not Currently    Alcohol/week: 2.0 standard drinks    Types: 2 Standard drinks or equivalent per week  . Drug use: Never  . Sexual activity: Yes  Other Topics Concern  . Not on file  Social History Narrative   Lives with child   Caffeine- sodas 16 oz, 2 daily   Social Determinants of Health    Financial Resource Strain: Not on file  Food Insecurity: Not on file  Transportation Needs: Not on file  Physical Activity: Not on file  Stress: Not on file  Social Connections: Not on file    Allergies:  Allergies  Allergen Reactions  . Codeine Swelling    Metabolic Disorder Labs: Lab Results  Component Value Date   HGBA1C 5.1 04/21/2020   MPG 99.67 04/21/2020   MPG 99.67 03/26/2020   Lab Results  Component Value Date   PROLACTIN 26.0 (H) 04/21/2020   PROLACTIN 7.0 03/05/2015   Lab Results  Component Value Date   CHOL 184 04/21/2020   TRIG 52 04/21/2020   HDL 46 04/21/2020   CHOLHDL 4.0 04/21/2020   VLDL 10 04/21/2020   LDLCALC 128 (H) 04/21/2020   LDLCALC 95 03/26/2020   Lab Results  Component Value Date   TSH 2.469 04/21/2020   TSH 1.565 03/26/2020    Therapeutic Level Labs: No results found for: LITHIUM No results found for: VALPROATE No components found for:  CBMZ  Current Medications: Current Outpatient Medications  Medication Sig Dispense Refill  . FLUoxetine (PROZAC) 40 MG capsule TAKE 1 CAPSULE BY MOUTH EVERY DAY 90 capsule 0  . hydrOXYzine (ATARAX/VISTARIL) 25 MG tablet Take 1 tablet (25 mg total) by mouth 3 (three) times daily as needed for anxiety. 75 tablet 0  . lamoTRIgine (LAMICTAL) 100 MG tablet Take 1 tablet (100 mg total) by mouth 2 (two) times daily. For mood stabilization 60 tablet 0  . lamoTRIgine (LAMICTAL) 100 MG tablet Take 1 tablet (100 mg total) by mouth 2 (two) times daily. 60 tablet 0  . QUEtiapine (SEROQUEL) 50 MG tablet Take 3 tablets (150 mg total) by mouth at bedtime. For mood control 30 tablet 0  . QUEtiapine (SEROQUEL) 50 MG tablet Take 3 tablets (150 mg total) by mouth at bedtime. 90 tablet 0  . traZODone (DESYREL) 50 MG tablet Take 1 tablet (50 mg total) by mouth at bedtime. 30 tablet 0   No current facility-administered medications for this visit.     Musculoskeletal: Strength & Muscle Tone: within normal  limits Gait & Station: normal Patient leans: N/A  Psychiatric Specialty Exam: Review of Systems  There were no vitals taken for this visit.There is no height or weight on file to calculate BMI.  General Appearance: Casual  Eye Contact:  Good  Speech:  Clear and Coherent  Volume:  Normal  Mood:  Anxious and Depressed  Affect:  Congruent  Thought Process:  Coherent  Orientation:  Full (Time, Place, and Person)  Thought Content: Logical   Suicidal Thoughts:  No  Homicidal Thoughts:  No  Memory:  Immediate;   Fair Recent;   Fair  Judgement:  Good  Insight:  Good  Psychomotor Activity:  Normal  Concentration:  Concentration: Fair  Recall:  [place] of Knowledge: Good  Language: Good  Akathisia:  No  Handed:  Right  AIMS (if indicated):   Assets:  Communication Skills Desire for Improvement Resilience Social Support  ADL's:  Intact  Cognition: WNL  Sleep:  Fair   Screenings: AIMS   Flowsheet Row Admission (Discharged) from 04/22/2020 in [facility]  AIMS Total Score 0    AUDIT   Flowsheet Row Admission (Discharged) from 04/22/2020 in [facility]  Alcohol Use Disorder Identification Test Final Score (AUDIT) 1    GAD-7   Flowsheet Row Video Visit from 04/16/2020 in [facility] Office Visit from 01/31/2020 in [facility] Visit from [facility]/19/2019 in [facility] Visit from 06/21/2018 in [facility] Visit from 04/20/2018 in [facility]  Total GAD-7 Score 21 16 21 10 16     PHQ2-9   Flowsheet Row Counselor from 05/21/2020 in [place] ED from 04/21/2020 in [facility] Video Visit from 04/16/2020 in [facility] Office Visit  from 01/31/2020 in [facility] Office Visit from 12/19/2019 in [place]  PHQ-2 Total Score 5 6 6 4 6   PHQ-9 Total Score 23 24 20 16 25        Assessment and Plan:  Continue partial hospitalization program Continue medications as directed  Treatment plan was reviewed and agreed upon by NP T. Bobby Rumpf and patient Dashley Monts need for continued group services.   Derrill Center, NP 06/04/2020, 10:32 AM

## 2020-06-04 NOTE — Therapy (Signed)
[place], [place], [address] Phone: [phone]   Fax:  [phone] Virtual Visit via Video Note  I connected with [name] on 06/04/20 at  11:00 AM EST by a video enabled telemedicine application and verified that I am speaking with the correct person using two identifiers.  Location: Patient: Patient Home Provider: Home Office   I discussed the limitations of evaluation and management by telemedicine and the availability of in person appointments. The patient expressed understanding and agreed to proceed.   I discussed the assessment and treatment plan with the patient. The patient was provided an opportunity to ask questions and all were answered. The patient agreed with the plan and demonstrated an understanding of the instructions.   The patient was advised to call back or seek an in-person evaluation if the symptoms worsen or if the condition fails to improve as anticipated.  I provided 60 minutes of non-face-to-face time during this encounter.   Ponciano Ort, OT   Occupational Therapy Treatment  Patient Details  Name: [name] MRN: [id number] Date of Birth: 02-23-1981 Referring Provider (OT): Ricky Ala   Encounter Date: 06/04/2020   OT End of Session - 06/04/20 1441    Visit Number 10    Number of Visits 20    Date for OT Re-Evaluation 06/17/20    Authorization Type [employer] 1/1/-05/17/2021   Deduct 1000- 0 met   OOP 3000- 0 met   No Copay Co Ins 20%   Visit limit based on medical necessity for our facility   Visit limit for MD office 60- 0 used ST/OT combined    OT Start Time 1100    OT Stop Time 1200    OT Time Calculation (min) 60 min    Activity Tolerance Patient tolerated treatment well    Behavior During Therapy [name] for tasks assessed/performed           Past Medical History:  Diagnosis Date  . Headache   . History of bipolar disorder   . History of  pancreatitis   . Hx of varicella   . Migraines   . Vaginal Pap smear, abnormal     Past Surgical History:  Procedure Laterality Date  . CESAREAN SECTION N/A 06/04/2016   Procedure: CESAREAN SECTION;  Surgeon: Tyson Dense, MD;  Location: [facility];  Service: Obstetrics;  Laterality: N/A;  . MOUTH SURGERY     x 2, wisdom teeth, adjust teeth    There were no vitals filed for this visit.   Subjective Assessment - 06/04/20 1440    Currently in Pain? No/denies    Pain Score 0-No pain           OT Education - 06/04/20 1440    Education Details Educated on different factors that contribute to our ability to manage our time, along with specific time management tips/strategies, including procrastination    Person(s) Educated Patient    Methods Explanation;Handout    Comprehension Verbalized understanding            OT Short Term Goals - 05/22/20 0810      OT SHORT TERM GOAL #1   Status On-going      OT SHORT TERM GOAL #2   Status On-going      OT SHORT TERM GOAL #3   Status On-going      OT SHORT TERM GOAL #4   Status On-going  Group Session:  S: "I need a schedule or more of a routine to follow"  O: Group began with a warm-up ice breaker activity where patients were encouraged to reflect on the scenario "If you had 86,400 dollars dropped into your bank account at midnight and 24 hours to spend it, what you use the money for? The only rule was that any money not used disappeared after 24 hours." Warm-up activity was used as an Designer, industrial/product and a way to connect that similar to the scenario of money, we do not get time back and there are 86,400 seconds in a day. Warm-up transitioned into today's group focused on topic of Time Management. Group members identified ways in which they currently struggle with managing their time and discussion focused on alternative strategies to recognize how we can be more productive and intentional with our time and  managing it appropriately. Group members also discussed specific strategies to overcome procrastination.   A: [name] was moderately engaged and independent in her participation of group discussion, sharing that she would like more of a schedule in her daily routine and recognized that she does not have one right now. She was less engaged than noted in previous sessions, however verbalized that she was feeling distracted, as today is her daughter's fourth birthday. She appeared attentive, despite lack of verbal engagement, and appeared receptive to education and strategies thrown out to use from group members.   P: Continue to attend PHP OT group sessions 5x week for 1 weeks to promote daily structure, social engagement, and opportunities to develop and utilize adaptive strategies to maximize functional performance in preparation for safe transition and integration back into school, work, and the community. Plan to address topic of time management in next OT group session.   Plan - 06/04/20 1441    Occupational performance deficits (Please refer to evaluation for details): ADL's;IADL's;Rest and Sleep;Education;Work;Leisure;Social Participation    Body Structure / Function / Physical Skills ADL    Cognitive Skills Attention;Consciousness;Emotional;Energy/Drive;Memory;Orientation;Perception;Understand;Thought;Temperament/Personality;Safety Awareness;Problem Solve    Psychosocial Skills Coping Strategies;Environmental  Adaptations;Habits;Interpersonal Interaction;Routines and Behaviors           Patient will benefit from skilled therapeutic intervention in order to improve the following deficits and impairments:   Body Structure / Function / Physical Skills: ADL Cognitive Skills: Attention,Consciousness,Emotional,Energy/Drive,Memory,Orientation,Perception,Understand,Thought,Temperament/Personality,Safety Awareness,Problem Solve Psychosocial Skills: Coping Strategies,Environmental   Adaptations,Habits,Interpersonal Interaction,Routines and Behaviors   Visit Diagnosis: Difficulty coping  Frontal lobe and executive function deficit  Bipolar 1 disorder, mixed, moderate ([place])    Problem List Patient Active Problem List   Diagnosis Date Noted  . Bipolar 1 disorder ([place]) 04/22/2020  . MDD (major depressive disorder), recurrent episode, severe ([place]) 04/22/2020  . Relationship problems 01/31/2020  . Worsening headaches 10/13/2019  . Paresthesia 10/13/2019  . Upper extremity weakness 10/13/2019  . Other symptoms and signs involving the nervous system 10/13/2019  . 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 Nevus 03/15/2017  . 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 Encounter for insertion of intrauterine contraceptive device (IUD) 08/05/2016  . Iron deficiency anemia 03/20/2016  . Borderline personality disorder ([place]) 07/09/2015  . Generalized anxiety disorder 07/09/2015  . Bipolar 1 disorder, mixed, moderate ([place]) 07/09/2015  . Left breast lump 03/05/2015    06/04/2020  Ponciano Ort, MOT, OTR/L  06/04/2020, 2:41 PM  [place], [place], [address] Phone: [phone]   Fax:  [phone]  Name: [name] MRN: [id number] Date of Birth: 1980-06-14

## 2020-06-04 NOTE — Progress Notes (Unsigned)
Spoke with patient via Webex video call, used 2 identifiers to correctly identify patient. States that groups are going well. She has been having some crazy dreams and believes its due to taking Trazodone but she is sleeping much better. Sunday was a rough day for her. She was feeling suicidal and called her ex-boyfriend. He was able to help her and talk her through it. She does not feel suicidal and today is her daughters 16th birthday. She states that her relationship  Is civil with her ex but they are not together. Group is helping her but she wishes she could continue for a long time. Denies SI/HI or AV hallucinations. On scale 1-10 as 10 being worst she rates depression at 8 and anxiety at 5. No issues or complaints.

## 2020-06-05 ENCOUNTER — Other Ambulatory Visit (HOSPITAL_COMMUNITY): Payer: No Typology Code available for payment source | Admitting: Licensed Clinical Social Worker

## 2020-06-05 ENCOUNTER — Telehealth (HOSPITAL_COMMUNITY): Payer: Self-pay | Admitting: Psychiatry

## 2020-06-05 ENCOUNTER — Encounter (HOSPITAL_COMMUNITY): Payer: Self-pay

## 2020-06-05 ENCOUNTER — Other Ambulatory Visit (HOSPITAL_COMMUNITY): Payer: No Typology Code available for payment source | Admitting: Occupational Therapy

## 2020-06-05 ENCOUNTER — Encounter (HOSPITAL_COMMUNITY): Payer: Self-pay | Admitting: Family

## 2020-06-05 ENCOUNTER — Other Ambulatory Visit: Payer: Self-pay

## 2020-06-05 DIAGNOSIS — F3162 Bipolar disorder, current episode mixed, moderate: Secondary | ICD-10-CM | POA: Diagnosis not present

## 2020-06-05 DIAGNOSIS — F603 Borderline personality disorder: Secondary | ICD-10-CM

## 2020-06-05 DIAGNOSIS — F331 Major depressive disorder, recurrent, moderate: Secondary | ICD-10-CM

## 2020-06-05 DIAGNOSIS — F332 Major depressive disorder, recurrent severe without psychotic features: Secondary | ICD-10-CM

## 2020-06-05 DIAGNOSIS — R41844 Frontal lobe and executive function deficit: Secondary | ICD-10-CM

## 2020-06-05 DIAGNOSIS — R4589 Other symptoms and signs involving emotional state: Secondary | ICD-10-CM

## 2020-06-05 MED ORDER — LAMOTRIGINE 100 MG PO TABS: 100.0000 mg | ORAL_TABLET | Freq: Two times a day (BID) | ORAL | 0 refills | Status: DC

## 2020-06-05 MED ORDER — TRAZODONE HCL 50 MG PO TABS: 50.0000 mg | ORAL_TABLET | Freq: Every day | ORAL | 0 refills | Status: DC

## 2020-06-05 MED ORDER — HYDROXYZINE HCL 25 MG PO TABS: 25.0000 mg | ORAL_TABLET | Freq: Three times a day (TID) | ORAL | 0 refills | Status: DC | PRN

## 2020-06-05 NOTE — Therapy (Signed)
[place], [place], [address] Phone: [phone]   Fax:  [phone] Virtual Visit via Video Note  I connected with [name] on 06/05/20 at  12:00 PM EST by a video enabled telemedicine application and verified that I am speaking with the correct person using two identifiers.  Location: Patient: Patient Home Provider: Clinic Office    I discussed the limitations of evaluation and management by telemedicine and the availability of in person appointments. The patient expressed understanding and agreed to proceed.   I discussed the assessment and treatment plan with the patient. The patient was provided an opportunity to ask questions and all were answered. The patient agreed with the plan and demonstrated an understanding of the instructions.   The patient was advised to call back or seek an in-person evaluation if the symptoms worsen or if the condition fails to improve as anticipated.  I provided 50 minutes of non-face-to-face time during this encounter.   Ponciano Ort, OT   Occupational Therapy Treatment  Patient Details  Name: [name] MRN: [id number] Date of Birth: 01/24/1981 Referring Provider (OT): Ricky Ala   Encounter Date: 06/05/2020   OT End of Session - 06/05/20 1457    Visit Number 11    Number of Visits 20    Date for OT Re-Evaluation 06/17/20    Authorization Type [employer] 1/1/-05/17/2021   Deduct 1000- 0 met   OOP 3000- 0 met   No Copay Co Ins 20%   Visit limit based on medical necessity for our facility   Visit limit for MD office 60- 0 used ST/OT combined    OT Start Time 1200    OT Stop Time 1250    OT Time Calculation (min) 50 min    Activity Tolerance Patient tolerated treatment well    Behavior During Therapy [facility] for tasks assessed/performed           Past Medical History:  Diagnosis Date  . Headache   . History of bipolar disorder   . History of  pancreatitis   . Hx of varicella   . Migraines   . Vaginal Pap smear, abnormal     Past Surgical History:  Procedure Laterality Date  . CESAREAN SECTION N/A 06/04/2016   Procedure: CESAREAN SECTION;  Surgeon: Tyson Dense, MD;  Location: [place];  Service: Obstetrics;  Laterality: N/A;  . MOUTH SURGERY     x 2, wisdom teeth, adjust teeth    There were no vitals filed for this visit.   Subjective Assessment - 06/05/20 1456    Currently in Pain? No/denies    Pain Score 0-No pain           OT Education - 06/05/20 1457    Education Details Continued education on different factors that contribute to our ability to manage our time, along with specific time management tips/strategies, including procrastination    Person(s) Educated Patient    Methods Explanation;Handout    Comprehension Verbalized understanding            OT Short Term Goals - 05/22/20 0810      OT SHORT TERM GOAL #1   Status On-going      OT SHORT TERM GOAL #2   Status On-going      OT SHORT TERM GOAL #3   Status On-going      OT SHORT TERM GOAL #4   Status On-going  Group Session:  S: "I have a paper calendar that I use to plan out things and then I make loose to-do lists of things I have to get done, but I don't really follow them."  O: Group began with a recap on strategies/tips learned from previous OT session focused on time management. Today's group continued to focus on topic of time management and reviewed additional strategies to manage and create schedules that are more efficient and effective in meeting their needs. Group members also discussed specific strategies to overcome procrastination.   A: Evolette was active in her participation of discussion and appeared receptive to education and additional strategies reviewed on improving time management. She shared that one thing she finds helpful right now is writing things down on her paper calendar and putting sticky  notes up on the door frame as she is walking out the door as visual reminders.   P: Continue to attend PHP OT group sessions 5x week for 1 weeks to promote daily structure, social engagement, and opportunities to develop and utilize adaptive strategies to maximize functional performance in preparation for safe transition and integration back into school, work, and the community.   Plan - 06/05/20 1458    Occupational performance deficits (Please refer to evaluation for details): ADL's;IADL's;Rest and Sleep;Education;Work;Leisure;Social Participation    Body Structure / Function / Physical Skills ADL    Cognitive Skills Attention;Consciousness;Emotional;Energy/Drive;Memory;Orientation;Perception;Understand;Thought;Temperament/Personality;Safety Awareness;Problem Solve    Psychosocial Skills Coping Strategies;Environmental  Adaptations;Habits;Interpersonal Interaction;Routines and Behaviors           Patient will benefit from skilled therapeutic intervention in order to improve the following deficits and impairments:   Body Structure / Function / Physical Skills: ADL Cognitive Skills: Attention,Consciousness,Emotional,Energy/Drive,Memory,Orientation,Perception,Understand,Thought,Temperament/Personality,Safety Awareness,Problem Solve Psychosocial Skills: Coping Strategies,Environmental  Adaptations,Habits,Interpersonal Interaction,Routines and Behaviors   Visit Diagnosis: Difficulty coping  Frontal lobe and executive function deficit  Bipolar 1 disorder, mixed, moderate ([place])    Problem List Patient Active Problem List   Diagnosis Date Noted  . Bipolar 1 disorder ([place]) 04/22/2020  . MDD (major depressive disorder), recurrent episode, severe ([place]) 04/22/2020  . Relationship problems 01/31/2020  . Worsening headaches 10/13/2019  . Paresthesia 10/13/2019  . Upper extremity weakness 10/13/2019  . Other symptoms and signs involving the nervous system 10/13/2019  . 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 Nevus 03/15/2017  . 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 Encounter for insertion of intrauterine contraceptive device (IUD) 08/05/2016  . Iron deficiency anemia 03/20/2016  . Borderline personality disorder ([place]) 07/09/2015  . Generalized anxiety disorder 07/09/2015  . Bipolar 1 disorder, mixed, moderate ([place]) 07/09/2015  . Left breast lump 03/05/2015    06/05/2020  Ponciano Ort, MOT, OTR/L  06/05/2020, 2:58 PM  [facility] PARTIAL HOSPITALIZATION PROGRAM [place], [place], [address] Phone: [phone]   Fax:  [phone]  Name: HUBERT DERSTINE MRN: [id number] Date of Birth: 19-Nov-1980

## 2020-06-05 NOTE — Progress Notes (Signed)
Refilled medicaitons.  °

## 2020-06-06 ENCOUNTER — Other Ambulatory Visit (HOSPITAL_COMMUNITY): Payer: No Typology Code available for payment source | Admitting: Licensed Clinical Social Worker

## 2020-06-06 ENCOUNTER — Other Ambulatory Visit: Payer: Self-pay

## 2020-06-06 ENCOUNTER — Other Ambulatory Visit (HOSPITAL_COMMUNITY): Payer: No Typology Code available for payment source | Admitting: Occupational Therapy

## 2020-06-06 ENCOUNTER — Encounter (HOSPITAL_COMMUNITY): Payer: Self-pay

## 2020-06-06 DIAGNOSIS — F603 Borderline personality disorder: Secondary | ICD-10-CM

## 2020-06-06 DIAGNOSIS — R4589 Other symptoms and signs involving emotional state: Secondary | ICD-10-CM

## 2020-06-06 DIAGNOSIS — F3162 Bipolar disorder, current episode mixed, moderate: Secondary | ICD-10-CM

## 2020-06-06 DIAGNOSIS — R41844 Frontal lobe and executive function deficit: Secondary | ICD-10-CM

## 2020-06-06 DIAGNOSIS — F332 Major depressive disorder, recurrent severe without psychotic features: Secondary | ICD-10-CM

## 2020-06-06 NOTE — Psych (Signed)
Virtual Visit via Video Note  I connected with [name] on 06/04/20 at  9:00 AM EST by a video enabled telemedicine application and verified that I am speaking with the correct person using two identifiers.  Location: Patient: patient home Provider: clinical home office   I discussed the limitations of evaluation and management by telemedicine and the availability of in person appointments. The patient expressed understanding and agreed to proceed.  I discussed the assessment and treatment plan with the patient. The patient was provided an opportunity to ask questions and all were answered. The patient agreed with the plan and demonstrated an understanding of the instructions.   The patient was advised to call back or seek an in-person evaluation if the symptoms worsen or if the condition fails to improve as anticipated.  Pt was provided 240 minutes of non-face-to-face time during this encounter.   Lorin Glass, LCSW    [facility] [place] PHP THERAPIST PROGRESS NOTE  MARSHAY SLATES [phone]  Session Time: 9:00 - 10:00  Participation Level: Active  Behavioral Response: CasualAlertDepressed  Type of Therapy: Group Therapy  Treatment Goals addressed: Coping  Interventions: CBT, DBT, Supportive and Reframing  Summary: Clinician led check-in regarding current stressors and situation. Clinician utilized active listening and empathetic response and validated patient emotions. Clinician facilitated processing group on pertinent issues.   Therapist Response: [name] is a 40 y.o. female who presents with depression and mood symptoms. Patient arrived within time allowed and reports that she is feeling "okay." Patient rates hermood at a7.5on a scale of 1-10 with 10 being great. Pt reports it is [name] daughter's birthday and she is excited to focus on that. Pt states she had a migraine yesterday that incapacitated [name]. Pt reports experiencing SI with plan on Sunday and reaching out to  [name] support system to manage. Pt reports she continues to struggle with hopelessness and is able to safety plan for the day. Pt able to process. Pt engaged in discussion.      Session Time: 10:00 - 11:00   Participation Level:Active  Behavioral Response:CasualAlertDepressed  Type of Therapy:GroupTherapy  Treatment Goals addressed: Coping  Interventions:CBT, DBT, Supportive and Reframing  Summary:Cln led processing group for pt's current struggles. Group members shared stressors and provided support and feedback. Cln brought in topics of boundaries, healthy relationships, and unhealthy thought processes to inform discussion.   Therapist Response: Pt able to process and provide support to group.      Session Time: 11:00- 12:00  Participation Level:Active  Behavioral Response:CasualAlertDepressed  Type of Therapy: Group Therapy, OT  Treatment Goals addressed: Coping  Interventions:Psychosocial skills training, Supportive  Summary:Occupational Therapy group  Therapist Response:Patient engaged in group. See OT note.      Session Time: 12:00 -1:00  Participation Level:Active  Behavioral Response:CasualAlertDepressed  Type of Therapy:Group therapy  Treatment Goals addressed: Coping  Interventions:CBT; Solution focused; Supportive; Reframing  Summary:12:00 - 12:50: Cln introduced topic of boundaries. Cln discussed how boundaries inform our relationships and affect self-esteem and personal agency. Group discussed the three types of boundaries: rigid, porous, and healthy and when each type is most helpful/harmful.  12:50 -1:00 Clinician led check-out. Clinician assessed for immediate needs, medication compliance and efficacy, and safety concerns  Therapist Response:12:00 - 12:50: Pt engaged in discussion and reports having mostly porous boundaries. 12:50 - 1:00: At check-out, patientrates hermood at [facility] on a scale of  1-10 with 10 being great.Pt reports afternoon plans ofcelebrating [name] daughter's birthday. Pt demonstrates some progress as evidenced  by managing suicidal thoughts effectively. Patient denies SI/HI at the end of group.    Suicidal/Homicidal: Nowithout intent/plan   Plan: Pt will continue in PHP while working to decrease depression symptoms, increase emotion regulation ability, and increase ability to manage symptoms in a healthy manner.   Diagnosis: MDD (major depressive disorder), recurrent severe, without psychosis ([place]) [F33.2]    1. MDD (major depressive disorder), recurrent severe, without psychosis ([place])   2. Borderline personality disorder ([place])       Lorin Glass, LCSW 06/06/2020

## 2020-06-06 NOTE — Therapy (Signed)
[place], [place], [address] Phone: [phone]   Fax:  [phone] Virtual Visit via Video Note  I connected with [name] on 06/06/20 at  11:00 AM EST by a video enabled telemedicine application and verified that I am speaking with the correct person using two identifiers.  Location: Patient: Patient Home Provider: Clinic Office    I discussed the limitations of evaluation and management by telemedicine and the availability of in person appointments. The patient expressed understanding and agreed to proceed.  I discussed the assessment and treatment plan with the patient. The patient was provided an opportunity to ask questions and all were answered. The patient agreed with the plan and demonstrated an understanding of the instructions.   The patient was advised to call back or seek an in-person evaluation if the symptoms worsen or if the condition fails to improve as anticipated.  I provided 60 minutes of non-face-to-face time during this encounter.   [name], OT   Occupational Therapy Treatment  Patient Details  Name: [name] MRN: [id number] Date of Birth: 1981/04/22 Referring Provider (OT): Ricky Ala   Encounter Date: 06/06/2020   OT End of Session - 06/06/20 1222    Visit Number 12    Number of Visits 20    Date for OT Re-Evaluation 06/17/20    Authorization Type [employer] 1/1/-05/17/2021   Deduct 1000- 0 met   OOP 3000- 0 met   No Copay Co Ins 20%   Visit limit based on medical necessity for our facility   Visit limit for MD office 60- 0 used ST/OT combined    OT Start Time 1100    OT Stop Time 1200    OT Time Calculation (min) 60 min    Activity Tolerance Patient tolerated treatment well    Behavior During Therapy [facility] for tasks assessed/performed           Past Medical History:  Diagnosis Date  . Headache   . History of bipolar disorder   . History of  pancreatitis   . Hx of varicella   . Migraines   . Vaginal Pap smear, abnormal     Past Surgical History:  Procedure Laterality Date  . CESAREAN SECTION N/A 06/04/2016   Procedure: CESAREAN SECTION;  Surgeon: Tyson Dense, MD;  Location: [place];  Service: Obstetrics;  Laterality: N/A;  . MOUTH SURGERY     x 2, wisdom teeth, adjust teeth    There were no vitals filed for this visit.   Subjective Assessment - 06/06/20 1222    Currently in Pain? No/denies            OT Education - 06/06/20 1222    Education Details Educated on procrastination and how it gets in the way of meeting our goals while also identifying specific tips/strategies to overcome identified obstacles    Person(s) Educated Patient    Methods Explanation;Handout    Comprehension Verbalized understanding            OT Short Term Goals - 05/22/20 0810      OT SHORT TERM GOAL #1   Status On-going      OT SHORT TERM GOAL #2   Status On-going      OT SHORT TERM GOAL #3   Status On-going      OT SHORT TERM GOAL #4   Status On-going         Group Session:  S: "I definitely tend to procrastinate on a lot of the things I have to get done."  O: Today's group session continued focus on time management, while also bringing up topic of procrastination. Group members reviewed common themes/reasons that we often say to ourselves to validate our procrastination and looked at strategies to combat this. Discussion also focused on identifying if what we our experiencing in the moment is true procrastination or a sign of our mental health deteriorating, specifically discussing lack of energy, lack of motivation, and lack of interest.   A: [name] was attentive to discussion, however less actively engaged verbally and in discussion than noted in previous sessions. Pt appeared focused on discussion and listening actively to other group members, however did not identify any specifics around her own  experiences with procrastination. Pt denied any questions or concerns with strategies reviewed and shared that this is something she struggles with in her daily routine.   P: Continue to attend PHP OT group sessions 5x week for the remainder of the weeks to promote daily structure, social engagement, and opportunities to develop and utilize adaptive strategies to maximize functional performance in preparation for safe transition and integration back into school, work, and the community. Plan to address topic of control in next OT group session.   Plan - 06/06/20 1222    Occupational performance deficits (Please refer to evaluation for details): ADL's;IADL's;Rest and Sleep;Education;Work;Leisure;Social Participation    Body Structure / Function / Physical Skills ADL    Cognitive Skills Attention;Consciousness;Emotional;Energy/Drive;Memory;Orientation;Perception;Understand;Thought;Temperament/Personality;Safety Awareness;Problem Solve    Psychosocial Skills Coping Strategies;Environmental  Adaptations;Habits;Interpersonal Interaction;Routines and Behaviors           Patient will benefit from skilled therapeutic intervention in order to improve the following deficits and impairments:   Body Structure / Function / Physical Skills: ADL Cognitive Skills: Attention,Consciousness,Emotional,Energy/Drive,Memory,Orientation,Perception,Understand,Thought,Temperament/Personality,Safety Awareness,Problem Solve Psychosocial Skills: Coping Strategies,Environmental  Adaptations,Habits,Interpersonal Interaction,Routines and Behaviors   Visit Diagnosis: Difficulty coping  Frontal lobe and executive function deficit  Bipolar 1 disorder, mixed, moderate ([place])    Problem List Patient Active Problem List   Diagnosis Date Noted  . Bipolar 1 disorder ([place]) 04/22/2020  . MDD (major depressive disorder), recurrent episode, severe ([place]) 04/22/2020  . Relationship problems 01/31/2020  . Worsening headaches  10/13/2019  . Paresthesia 10/13/2019  . Upper extremity weakness 10/13/2019  . Other symptoms and signs involving the nervous system 10/13/2019  . 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 Nevus 03/15/2017  . 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 Encounter for insertion of intrauterine contraceptive device (IUD) 08/05/2016  . Iron deficiency anemia 03/20/2016  . Borderline personality disorder ([place]) 07/09/2015  . Generalized anxiety disorder 07/09/2015  . Bipolar 1 disorder, mixed, moderate ([place]) 07/09/2015  . Left breast lump 03/05/2015    06/06/2020  [name], MOT, OTR/L  06/06/2020, 12:23 PM  [place], [place], [address] Phone: [phone]   Fax:  [phone]  Name: [name] MRN: [id number] Date of Birth: 01/25/1981

## 2020-06-07 ENCOUNTER — Ambulatory Visit (HOSPITAL_COMMUNITY): Payer: No Typology Code available for payment source | Admitting: Licensed Clinical Social Worker

## 2020-06-07 ENCOUNTER — Encounter (HOSPITAL_COMMUNITY): Payer: Self-pay

## 2020-06-07 ENCOUNTER — Other Ambulatory Visit (HOSPITAL_COMMUNITY): Payer: No Typology Code available for payment source

## 2020-06-07 ENCOUNTER — Other Ambulatory Visit (HOSPITAL_COMMUNITY): Payer: No Typology Code available for payment source | Admitting: Occupational Therapy

## 2020-06-07 ENCOUNTER — Other Ambulatory Visit: Payer: Self-pay

## 2020-06-07 DIAGNOSIS — R41844 Frontal lobe and executive function deficit: Secondary | ICD-10-CM

## 2020-06-07 DIAGNOSIS — R4589 Other symptoms and signs involving emotional state: Secondary | ICD-10-CM

## 2020-06-07 DIAGNOSIS — F3162 Bipolar disorder, current episode mixed, moderate: Secondary | ICD-10-CM

## 2020-06-07 NOTE — Therapy (Signed)
[place], [place], [address] Phone: [phone]   Fax:  [phone] Virtual Visit via Video Note  I connected with [name] on 06/07/20 at  11:00 AM EST by a video enabled telemedicine application and verified that I am speaking with the correct person using two identifiers.  Location: Patient: Patient Home Provider: Clinic Office    I discussed the limitations of evaluation and management by telemedicine and the availability of in person appointments. The patient expressed understanding and agreed to proceed.   I discussed the assessment and treatment plan with the patient. The patient was provided an opportunity to ask questions and all were answered. The patient agreed with the plan and demonstrated an understanding of the instructions.   The patient was advised to call back or seek an in-person evaluation if the symptoms worsen or if the condition fails to improve as anticipated.  I provided 60 minutes of non-face-to-face time during this encounter.   [name], OT   Occupational Therapy Treatment  Patient Details  Name: [name] MRN: [id number] Date of Birth: 28-May-1980 Referring Provider (OT): [name]   Encounter Date: 06/07/2020   OT End of Session - 06/07/20 1424    Visit Number 13    Number of Visits 20    Date for OT Re-Evaluation 06/17/20    Authorization Type [employer] 1/1/-05/17/2021   Deduct 1000- 0 met   OOP 3000- 0 met   No Copay Co Ins 20%   Visit limit based on medical necessity for our facility   Visit limit for MD office 60- 0 used ST/OT combined    OT Start Time 1115    OT Stop Time 1215    OT Time Calculation (min) 60 min    Activity Tolerance Patient tolerated treatment well    Behavior During Therapy WFL for tasks assessed/performed           Past Medical History:  Diagnosis Date   Headache    History of bipolar disorder    History of  pancreatitis    Hx of varicella    Migraines    Vaginal Pap smear, abnormal     Past Surgical History:  Procedure Laterality Date   CESAREAN SECTION N/A 06/04/2016   Procedure: CESAREAN SECTION;  Surgeon: Stacy Dense, MD;  Location: [facility];  Service: Obstetrics;  Laterality: N/A;   MOUTH SURGERY     x 2, wisdom teeth, adjust teeth    There were no vitals filed for this visit.   Subjective Assessment - 06/07/20 1424    Currently in Pain? No/denies            OT Education - 06/07/20 1424    Education Details Educated on identifying worry and utilized circle on control tool to categorize what we do and do not have control over    Person(s) Educated Patient    Methods Explanation;Handout    Comprehension Verbalized understanding            OT Short Term Goals - 06/07/20 1425      OT SHORT TERM GOAL #1   Title Pt will actively engage in OT group sessions throughout duration of PHP programming, in order to promote daily structure, social engagement, and opportunities to develop and utilize adaptive strategies to maximize functional performance in preparation for safe transition and integration back into school, work, and the community.    Status Achieved  OT SHORT TERM GOAL #2   Title Pt will practice and identify 1-3 adaptive coping strategies she can utilize, in order to safely manage increased depression/anxiety, with min cues, in preparation for safe and healthy reintegration back into the community at discharge.    Status Achieved      OT SHORT TERM GOAL #3   Title Pt will identify and implement 1-3 sleep hygiene strategies she can utilize, in order to improve sleep quality/ADL performance, in preparation for safe and healthy reintegration back into the community at discharge.    Status Achieved      OT SHORT TERM GOAL #4   Title Pt will identify and re-engage in 2-3 ADL/iADL routines, as it relates to re-engagement in household  chores/duties, in order to promote re-establishment of daily routines in preparation for reintegration back into the community.    Status Achieved         Group Session:  S: "I struggle with always wanting control, and I know that isn't possible in every situation, especially when it's involving other people's behaviors and actions."  O: Group began with a reflection and recap from previous OT session focused on procrastination and time management. Today's group session continued discussion on time management with a larger focus on the idea of worry. Group members shared current life situations and events that are causing them worry and reviewed a PowerPoint that discussed healthy vs unhealthy worry. Group members were also provided and introduced to the circle of control tool to determine what is within our control, what we have influence on, and what is not within our control. Group members shared specific examples and identified categories in which these worry fell within their own circle of control.   A: [name] was active and independent in her participation of discussion and activity, asking questions appropriately and sharing that she struggles with always wanting control and learning when to let things go. She was able to brainstorm her own scenario/worry to work through what is and is not within her control. Pt identified her own thoughts, feelings, behaviors, actions, and mindset as things within her control. She identified other people's reactions as areas of influence and how people treat her or form opinions around her behaviors as things not in her control. Pt was receptive and open to feedback and support from group clinician and other group members.   P: Plan to discharge patient today, effective 06/07/2020, with plan to follow up and start with MH-IOP beginning Monday 06/10/2020.   OCCUPATIONAL THERAPY DISCHARGE SUMMARY  Visits from Start of Care: 13  Current functional level related  to goals / functional outcomes: Patient has met all of her OT goals within the [facility] program and will discharge today with plan to follow up with MH-IOP beginning Monday 06/10/2020.   Remaining deficits: See above    Education / Equipment: See above   Plan: Patient agrees to discharge.  Patient goals were met. Patient is being discharged due to meeting the stated rehab goals.  ?????       Plan - 06/07/20 1424    Occupational performance deficits (Please refer to evaluation for details): ADL's;IADL's;Rest and Sleep;Education;Work;Leisure;Social Participation    Body Structure / Function / Physical Skills ADL    Cognitive Skills Attention;Consciousness;Emotional;Energy/Drive;Memory;Orientation;Perception;Understand;Thought;Temperament/Personality;Safety Awareness;Problem Solve    Psychosocial Skills Coping Strategies;Environmental  Adaptations;Habits;Interpersonal Interaction;Routines and Behaviors           Patient will benefit from skilled therapeutic intervention in order to improve the following deficits and impairments:  Body Structure / Function / Physical Skills: ADL Cognitive Skills: Attention,Consciousness,Emotional,Energy/Drive,Memory,Orientation,Perception,Understand,Thought,Temperament/Personality,Safety Awareness,Problem Solve Psychosocial Skills: Coping Strategies,Environmental  Adaptations,Habits,Interpersonal Interaction,Routines and Behaviors   Visit Diagnosis: Difficulty coping  Frontal lobe and executive function deficit  Bipolar 1 disorder, mixed, moderate ([place])    Problem List Patient Active Problem List   Diagnosis Date Noted   Bipolar 1 disorder ([place]) 04/22/2020   MDD (major depressive disorder), recurrent episode, severe ([place]) 04/22/2020   Relationship problems 01/31/2020   Worsening headaches 10/13/2019   Paresthesia 10/13/2019   Upper extremity weakness 10/13/2019   Other symptoms and signs involving the nervous system 10/13/2019   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   Nevus 03/15/2017   Postpartum depression 09/15/2016   Encounter for insertion of intrauterine contraceptive device (IUD) 08/05/2016   Iron deficiency anemia 03/20/2016   Borderline personality disorder ([place]) 07/09/2015   Generalized anxiety disorder 07/09/2015   Bipolar 1 disorder, mixed, moderate ([place]) 07/09/2015   Left breast lump 03/05/2015    06/07/2020  [name], MOT, OTR/L  06/07/2020, 2:25 PM  [facility] PARTIAL HOSPITALIZATION PROGRAM [place], [place], [address] Phone: [phone]   Fax:  [phone]  Name: [name] MRN: [id number] Date of Birth: January 08, 1981

## 2020-06-09 NOTE — Psych (Signed)
Virtual Visit via Video Note  I connected with [name] on 06/05/20 at  9:00 AM EST by a video enabled telemedicine application and verified that I am speaking with the correct person using two identifiers.  Location: Patient: patient home Provider: clinical home office   I discussed the limitations of evaluation and management by telemedicine and the availability of in person appointments. The patient expressed understanding and agreed to proceed.  I discussed the assessment and treatment plan with the patient. The patient was provided an opportunity to ask questions and all were answered. The patient agreed with the plan and demonstrated an understanding of the instructions.   The patient was advised to call back or seek an in-person evaluation if the symptoms worsen or if the condition fails to improve as anticipated.  Pt was provided 240 minutes of non-face-to-face time during this encounter.   Lorin Glass, LCSW    [facility] PHP THERAPIST PROGRESS NOTE  [name] [id number]  Session Time: 9:00 - 10:00  Participation Level: Active  Behavioral Response: CasualAlertDepressed  Type of Therapy: Group Therapy  Treatment Goals addressed: Coping  Interventions: CBT, DBT, Supportive and Reframing  Summary: Clinician led check-in regarding current stressors and situation. Clinician utilized active listening and empathetic response and validated patient emotions. Clinician facilitated processing group on pertinent issues.   Therapist Response: [name] is a 40 y.o. female who presents with depression and mood symptoms. Patient arrived within time allowed and reports that she is feeling "tired." Patient rates hermood at a6.5on a scale of 1-10 with 10 being great. Pt reports her afternoon went well celebrating her daughter. Pt identifies experiencing grief over this birthday being different. Pt reports experiencing hopelessness and denies SI. Pt able to process. Pt  engaged in discussion.      Session Time: 10:00 - 11:00   Participation Level:Active  Behavioral Response:CasualAlertDepressed  Type of Therapy:GroupTherapy  Treatment Goals addressed: Coping  Interventions:CBT, DBT, Supportive and Reframing  Summary:Cln led discussion on "work arounds" or finding intermediate solutions while we are in the process of working on a final solution. Group members shared situations in which they are struggling with an issue that they are unable to fix quickly. Cln encouraged pt's to consider ways to make things "doable" and not let "perfect" stand in the way.   Therapist Response:Pt engaged in discussion and is able to connect discussion to her own progress journey.       Session Time: 11:00- 12:00  Participation Level:Active  Behavioral Response:CasualAlertDepressed  Type of Therapy:Group therapy  Treatment Goals addressed: Coping  Interventions:Supportive, Reframing  Summary:Spiritual Care group  Therapist Response:Pt engaged in session. See chaplain note      Session Time: 12:00 -1:00  Participation Level:Active  Behavioral Response:CasualAlertDepressed  Type of Therapy:Group therapy  Treatment Goals addressed: Coping  Interventions:Psychosocial skills training, Supportive  Summary:12:00 - 12:50:Occupational Therapy group 12:50 -1:00 Clinician led check-out. Clinician assessed for immediate needs, medication compliance and efficacy, and safety concerns  Therapist Response:12:00 - 12:50:Patient engaged in group. See OT note.   12:50 - 1:00: At check-out, patientrates hermood at [facility] on a scale of 1-10 with 10 being great.Pt reports afternoon plans ofhaving down time. Pt demonstrates some progress as evidenced by increased emotion regulation skills. Patient denies SI/HI at the end of group.    Suicidal/Homicidal: Nowithout intent/plan   Plan: Pt will continue in  PHP while working to decrease depression symptoms, increase emotion regulation ability, and increase ability to manage symptoms in  a healthy manner.   Diagnosis: MDD (major depressive disorder), recurrent severe, without psychosis ([place]) [F33.2]    1. MDD (major depressive disorder), recurrent severe, without psychosis ([place])   2. Bipolar 1 disorder, mixed, moderate ([facility])   3. Moderate episode of recurrent major depressive disorder ([place])   4. Borderline personality disorder ([place])       Lorin Glass, LCSW 06/09/2020

## 2020-06-10 ENCOUNTER — Other Ambulatory Visit: Payer: Self-pay

## 2020-06-10 ENCOUNTER — Encounter (HOSPITAL_COMMUNITY): Payer: Self-pay | Admitting: Psychiatry

## 2020-06-10 ENCOUNTER — Ambulatory Visit (HOSPITAL_COMMUNITY): Payer: No Typology Code available for payment source

## 2020-06-10 ENCOUNTER — Other Ambulatory Visit (HOSPITAL_COMMUNITY): Payer: No Typology Code available for payment source

## 2020-06-10 ENCOUNTER — Other Ambulatory Visit (HOSPITAL_COMMUNITY): Payer: No Typology Code available for payment source | Admitting: Family

## 2020-06-10 DIAGNOSIS — F603 Borderline personality disorder: Secondary | ICD-10-CM

## 2020-06-10 DIAGNOSIS — F3162 Bipolar disorder, current episode mixed, moderate: Secondary | ICD-10-CM

## 2020-06-10 NOTE — Progress Notes (Signed)
Virtual Visit via Video Note  I connected with [name] on @TODAY @ at  9:00 AM EST by a video enabled telemedicine application and verified that I am speaking with the correct person using two identifiers.  Location: Patient: at home Provider: at home office   I discussed the limitations of evaluation and management by telemedicine and the availability of in person appointments. The patient expressed understanding and agreed to proceed.  I discussed the assessment and treatment plan with the patient. The patient was provided an opportunity to ask questions and all were answered. The patient agreed with the plan and demonstrated an understanding of the instructions.   The patient was advised to call back or seek an in-person evaluation if the symptoms worsen or if the condition fails to improve as anticipated.  I provided 20 minutes of non-face-to-face time during this encounter.   Carlis Abbott, RITA, M.Ed, CNA   Patient ID: [name], female   DOB: 26-Nov-1980, 40 y.o.   MRN: [id number]  As per previous CCA states:  Pt presents as PHP referral post-discharge for inpatient psychiatric admission due to suicide attempt by overdose. Pt states she found out her fiance, who she had been with for 5 years, had been cheating on her for the past 2 years, in June. Pt states she has been "spiraling" since then. Pt has had 2 suicide attempts since then: 1 in November and one in December. Pt states historical diagnoses of bipolar and borderline personality disorder and that in the past she has been able to "pull myself back"and has "lost" the ability since the event in June. Pt reports she has been staying with her parents since after her first suicide attempt in November. Pt denies substance use, AVH, and current SI/HI. Pt identifies intermittent passive SI.  Current Symptoms/Problems: Pt reports depression symptoms of depressed mood, worthlessness, low self-esteem, negative self-talk, low energy,  disrupted sleep, poor concentration, impaired appetite. Pt states anger, rumination, and "obsessing" over the infidelity.   Pt transitioned from PHP to [place] today.  She attended PHP from 05-20-20 thru 06-07-20.  Denies SI/HI or A/V hallucinations.  Reports that PHP groups were very helpful.  States she's been able to set boundaries better and is feeling more hopeful. On a scale of 1-10 (10 being the worst) pt rates depression at 8 and anxiety at a 5.   A:  Oriented pt to virtual MH-IOP.  Pt gave a verbal consent for treatment, to release chart information to referred providers and to complete any forms if needed.  Pt also gave consent for attending group virtually d/t COVID-19 social distancing restrictions.  Encouraged support groups.  Will refer pt to a therapist and psychiatrist if she doesn't have one.  R:  Pt receptive.  Dellia Nims, M.Ed,CNA

## 2020-06-10 NOTE — Progress Notes (Signed)
Virtual Visit via Video Note  I connected with [name] on 06/10/20 at  9:00 AM EST by a video enabled telemedicine application and verified that I am speaking with the correct person using two identifiers.  At orientation to the IOP program, Case Manager discussed the limitations of evaluation and management by telemedicine and the availability of in person appointments. The patient expressed understanding and agreed to proceed with virtual visits throughout the duration of the program.   Location:  Patient: Patient Home Provider: Home Office   History of Present Illness: Bipolar 1 DO and Borderline Personality DO  Observations/Objective: Check In: Case Manager checked in with all participants to review discharge dates, insurance authorizations, work-related documents and needs from the treatment team. Client stated needs and engaged in discussion. Case Manager introduced 2 new group members and prompted group to engage in the joining process.     Initial Therapeutic Activity: Counselor facilitated a group processing with group members to assess mood and current functioning. Counselor prompted group members to share about new life stressors, utilization of coping skills, and progress in treatment. Today is the Client's first IOP group session after stepping down from PHP. Client shared that PHP has been very helpful in stabilizing, combating suicidal thoughts and in learning new coping skills, like coloring and cognitive coping. Counselor prompted Client to share need for continued group treatment, current life stressors, about support system and expectations for group. Client noted 2 recent suicide attempts, life transitions, rebuilding self and goal to move back home/live independently. Client presents with moderate depression and moderate anxiety. Client denied any current SI/HI/psychosis.   Second Therapeutic Activity: Counselor prompted group members to share about what coping skills  have been helpful over their course of treatment, benefits of group treatment vs individual therapy, and topics they are interested in learning more about in the coming days. Group members engaged in discussion, sharing thoughts, experiences and ideas. Client noted that she would like more support around keeping boundaries healthy and looking positively towards her future.  Check Out: Counselor prompted group members to identify one self-care practice or productivity activity they would like to engage in today. Client plans to color, watch a favorite show, phone a friend and send a thank you letter to her former group leader. Client endorsed safety plan to be followed to prevent safety issues.  Assessment and Plan: Clinician recommends that Client remain in IOP treatment to better manage mental health symptoms, stabilization and to address treatment plan goals. Clinician recommends adherence to crisis/safety plan, taking medications as prescribed, and following up with medical professionals if any issues arise.   Follow Up Instructions: Clinician will send Webex link for next session. The Client was advised to call back or seek an in-person evaluation if the symptoms worsen or if the condition fails to improve as anticipated.     I provided 180 minutes of non-face-to-face time during this encounter.     Lise Auer, LCSW

## 2020-06-11 ENCOUNTER — Other Ambulatory Visit: Payer: Self-pay

## 2020-06-11 ENCOUNTER — Encounter (HOSPITAL_COMMUNITY): Payer: Self-pay | Admitting: Psychiatry

## 2020-06-11 ENCOUNTER — Encounter: Payer: Self-pay | Admitting: Physician Assistant

## 2020-06-11 ENCOUNTER — Other Ambulatory Visit (HOSPITAL_COMMUNITY): Payer: No Typology Code available for payment source | Admitting: Psychiatry

## 2020-06-11 ENCOUNTER — Telehealth (INDEPENDENT_AMBULATORY_CARE_PROVIDER_SITE_OTHER): Payer: No Typology Code available for payment source | Admitting: Physician Assistant

## 2020-06-11 DIAGNOSIS — F603 Borderline personality disorder: Secondary | ICD-10-CM | POA: Diagnosis not present

## 2020-06-11 DIAGNOSIS — F332 Major depressive disorder, recurrent severe without psychotic features: Secondary | ICD-10-CM

## 2020-06-11 DIAGNOSIS — F3162 Bipolar disorder, current episode mixed, moderate: Secondary | ICD-10-CM

## 2020-06-11 DIAGNOSIS — F411 Generalized anxiety disorder: Secondary | ICD-10-CM | POA: Diagnosis not present

## 2020-06-11 NOTE — Progress Notes (Signed)
..Virtual Visit via Telephone Note  I connected with [name] on 06/11/20 at  8:50 AM EST by telephone and verified that I am speaking with the correct person using two identifiers.  Location: Patient: home Provider: clinic  .[name] KitchenParticipating in visit:  Patient: [name] Provider: Iran Planas PA-C Provider in training: Martin Majestic PA-Student     I discussed the limitations, risks, security and privacy concerns of performing an evaluation and management service by telephone and the availability of in person appointments. I also discussed with the patient that there may be a patient responsible charge related to this service. The patient expressed understanding and agreed to proceed.   History of Present Illness: Patient is a 40 year old female with bipolar one, GAD, borderline personality disorder who presents to the clinic for follow-up. I had written her out on short-term disability for her mental health stabilization until February 8. She is undergoing outpatient intensive therapy from 9-12 every day. This is working really well for her. She denies any suicidal thoughts or homicidal idealizations. She is very optimistic in her future. She has a goal to move back into her own place mid February. Right now she is living with her mother and father. She hopes to return to work on the February 8 day at least part-time if she is still in counseling.  Patient is seeing a psychiatrist and a psychologist.   .. Active Ambulatory Problems    Diagnosis Date Noted  . Left breast lump 03/05/2015  . Borderline personality disorder ([place]) 07/09/2015  . Generalized anxiety disorder 07/09/2015  . Bipolar 1 disorder, mixed, moderate ([place]) 07/09/2015  . Iron deficiency anemia 03/20/2016  . Encounter for insertion of intrauterine contraceptive device (IUD) 08/05/2016  . 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 Nevus 03/15/2017  . 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 Worsening headaches 10/13/2019  .  Paresthesia 10/13/2019  . Upper extremity weakness 10/13/2019  . Other symptoms and signs involving the nervous system 10/13/2019  . Relationship problems 01/31/2020  . Bipolar 1 disorder ([place]) 04/22/2020  . MDD (major depressive disorder), recurrent episode, severe ([place]) 04/22/2020   Resolved Ambulatory Problems    Diagnosis Date Noted  . Nipple discharge 03/05/2015  . Depression 07/09/2015  . [redacted] weeks gestation of pregnancy 03/20/2016  . Normal pregnancy 06/03/2016  . Class 1 obesity due to excess calories without serious comorbidity with body mass index (BMI) of 32.0 to 32.9 in adult 04/20/2018   Past Medical History:  Diagnosis Date  . Headache   . History of bipolar disorder   . History of pancreatitis   . Hx of varicella   . Migraines   . Vaginal Pap smear, abnormal      Observations/Objective: No acute distress Normal mood    Assessment and Plan: [name] KitchenMarland KitchenDiagnoses and all orders for this visit:  Bipolar 1 disorder, mixed, moderate ([facility])  Severe episode of recurrent major depressive disorder, without psychotic features ([place])  Borderline personality disorder ([place])  Generalized anxiety disorder   Patient is actually being managed by psychiatry and psychology at this point. She touch base with me because I'm filling out her short-term disability paperwork. She is scheduled to go back February 8. I'm hoping that this still can happen. I think going back to work would be good for her. If she is still in her 9-12 outpatient intensive therapy I'm hoping she can at least work part-time more few hours each day. She has a goal to move back out of her parents house into her home  by mid February. I think this is a great goal. She seems to be doing really well. Encouraged her to stay on medications. Per patient she has a much better outlook on treating her disease with medications and staying on them.   Follow Up Instructions:    I discussed the assessment and treatment plan with  the patient. The patient was provided an opportunity to ask questions and all were answered. The patient agreed with the plan and demonstrated an understanding of the instructions.   The patient was advised to call back or seek an in-person evaluation if the symptoms worsen or if the condition fails to improve as anticipated.  I provided 10 minutes of non-face-to-face time during this encounter.   Iran Planas, PA-C

## 2020-06-11 NOTE — Progress Notes (Addendum)
Virtual Visit via Video Note  I connected with [name] on 06/11/20 at  9:00 AM EST by a video enabled telemedicine application and verified that I am speaking with the correct person using two identifiers.  Location: Patient: Home Provider: Office   I discussed the limitations of evaluation and management by telemedicine and the availability of in person appointments. The patient expressed understanding and agreed to proceed.   I discussed the assessment and treatment plan with the patient. The patient was provided an opportunity to ask questions and all were answered. The patient agreed with the plan and demonstrated an understanding of the instructions.   The patient was advised to call back or seek an in-person evaluation if the symptoms worsen or if the condition fails to improve as anticipated.  I provided 15 minutes of non-face-to-face time during this encounter.   Derrill Center, NP    Psychiatric Initial Adult Assessment   Patient Identification: [name] MRN:  [id number] Date of Evaluation:  06/11/2020 Referral Source: PHP  Chief Complaint:   Chief Complaint    Depression; Anxiety     Visit Diagnosis:    ICD-10-CM   1. Bipolar 1 disorder, mixed, moderate (HCC)  F31.62   2. Borderline personality disorder ([place])  F60.3     History of Present Illness:  Per admission assessment note [name] is a 40 y.o.Caucasianfemale presents after recent inpatient admission for suicidal attempt.Reported struggling with depression and multiple stressors. Reported previous inpatient admissions. Reported diagnosis with major depressive disorder, generalized anxiety disorder,bipolar and borderline personality disorder. Reported a recent break-up triggered this inpatient admission as she attempted to hang herself. Patient completed partial hospitalization programming (PHP) and will start intensive outpatient programming (IOP)   Evaluation for IOP- : [name] presents with a bright  and pleasant affect.  Denying suicidal or homicidal ideations.  Reports she continues to struggle with depression and appears to be mood dependent.  Reports taking and tolerating medications well.  Patient recently completed partial hospitalization programming.  Denied any other safety concerns at this time.  Patient to start intensive outpatient programming on 06/10/2020  Associated Signs/Symptoms: Depression Symptoms:  depressed mood, difficulty concentrating, anxiety, (Hypo) Manic Symptoms:  Distractibility, Anxiety Symptoms:  Excessive Worry, Psychotic Symptoms:  Hallucinations: None PTSD Symptoms: NA  Past Psychiatric History: Previous inpatient admissions.  Chart reviewed patient diagnosed with major depressive disorder generalized anxiety disorder and bipolar 1 disorder.  Previous Psychotropic Medications: Yes   Substance Abuse History in the last 12 months:  No.  Consequences of Substance Abuse: NA  Past Medical History:  Past Medical History:  Diagnosis Date  . Headache   . History of bipolar disorder   . History of pancreatitis   . Hx of varicella   . Migraines   . Vaginal Pap smear, abnormal     Past Surgical History:  Procedure Laterality Date  . CESAREAN SECTION N/A 06/04/2016   Procedure: CESAREAN SECTION;  Surgeon: Tyson Dense, MD;  Location: [facility];  Service: Obstetrics;  Laterality: N/A;  . MOUTH SURGERY     x 2, wisdom teeth, adjust teeth    Family Psychiatric History:   Family History:  Family History  Problem Relation Age of Onset  . Stroke Father   . Heart attack Father   . Cancer Maternal Aunt        stomach  . Cancer Maternal Grandmother        ovarian  . Headache Mother   . Migraines Brother   .  Depression Paternal Grandmother     Social History:   Social History   Socioeconomic History  . Marital status: Single    Spouse name: Not on file  . Number of children: 1  . Years of education: Not on file  . Highest  education level: High school graduate  Occupational History  . Not on file  Tobacco Use  . Smoking status: Former Smoker    Packs/day: 0.50    Types: Cigarettes    Quit date: 09/16/2015    Years since quitting: 4.7  . Smokeless tobacco: Never Used  Vaping Use  . Vaping Use: Never used  Substance and Sexual Activity  . Alcohol use: Not Currently    Alcohol/week: 2.0 standard drinks    Types: 2 Standard drinks or equivalent per week  . Drug use: Never  . Sexual activity: Yes  Other Topics Concern  . Not on file  Social History Narrative   Lives with child   Caffeine- sodas 16 oz, 2 daily   Social Determinants of Health   Financial Resource Strain: Not on file  Food Insecurity: Not on file  Transportation Needs: Not on file  Physical Activity: Not on file  Stress: Not on file  Social Connections: Not on file    Additional Social History:   Allergies:   Allergies  Allergen Reactions  . Codeine Swelling    Metabolic Disorder Labs: Lab Results  Component Value Date   HGBA1C 5.1 04/21/2020   MPG 99.67 04/21/2020   MPG 99.67 03/26/2020   Lab Results  Component Value Date   PROLACTIN 26.0 (H) 04/21/2020   PROLACTIN 7.0 03/05/2015   Lab Results  Component Value Date   CHOL 184 04/21/2020   TRIG 52 04/21/2020   HDL 46 04/21/2020   CHOLHDL 4.0 04/21/2020   VLDL 10 04/21/2020   LDLCALC 128 (H) 04/21/2020   LDLCALC 95 03/26/2020   Lab Results  Component Value Date   TSH 2.469 04/21/2020    Therapeutic Level Labs: No results found for: LITHIUM No results found for: CBMZ No results found for: VALPROATE  Current Medications: Current Outpatient Medications  Medication Sig Dispense Refill  . FLUoxetine (PROZAC) 40 MG capsule TAKE 1 CAPSULE BY MOUTH EVERY DAY 90 capsule 0  . hydrOXYzine (ATARAX/VISTARIL) 25 MG tablet Take 1 tablet (25 mg total) by mouth 3 (three) times daily as needed for anxiety. 75 tablet 0  . lamoTRIgine (LAMICTAL) 100 MG tablet Take 1  tablet (100 mg total) by mouth 2 (two) times daily. For mood stabilization 60 tablet 0  . QUEtiapine (SEROQUEL) 50 MG tablet Take 3 tablets (150 mg total) by mouth at bedtime. 90 tablet 0  . traZODone (DESYREL) 50 MG tablet Take 1 tablet (50 mg total) by mouth at bedtime. 30 tablet 0  . AJOVY 225 MG/1.5ML SOAJ     . NURTEC 75 MG TBDP Take by mouth.     No current facility-administered medications for this visit.    Musculoskeletal: Strength & Muscle Tone: within normal limits Gait & Station: normal Patient leans: N/A  Psychiatric Specialty Exam: Review of Systems  There were no vitals taken for this visit.There is no height or weight on file to calculate BMI.  General Appearance: Casual  Eye Contact:  Good  Speech:  Clear and Coherent  Volume:  Normal  Mood:  Anxious and Depressed  Affect:  Congruent  Thought Process:  Coherent  Orientation:  Full (Time, Place, and Person)  Thought Content:  Logical  Suicidal Thoughts:  No  Homicidal Thoughts:  No  Memory:  Immediate;   Fair Recent;   Fair  Judgement:  Fair  Insight:  Good and Fair  Psychomotor Activity:  Normal  Concentration:  Concentration: Fair  Recall:  [place] of Knowledge:Fair  Language: Fair  Akathisia:  No  Handed:  Right  AIMS (if indicated):    Assets:  Communication Skills Desire for Improvement Resilience Social Support  ADL's:  Intact  Cognition: WNL  Sleep:  Fair   Screenings: AIMS   Flowsheet Row Admission (Discharged) from 04/22/2020 in [facility]  AIMS Total Score 0    AUDIT   Flowsheet Row Admission (Discharged) from 04/22/2020 in [facility]  Alcohol Use Disorder Identification Test Final Score (AUDIT) 1    GAD-7   Flowsheet Row Video Visit from 04/16/2020 in [facility] Office Visit from 01/31/2020 in [facility] Visit from 12/19/2019 in [facility] Office Visit from 06/21/2018 in [place] [facility] Visit from 04/20/2018 in [facility]  Total GAD-7 Score 21 16 21 10 16     PHQ2-9   Flowsheet Row Counselor from 05/21/2020 in [place] ED from 04/21/2020 in [facility] Video Visit from 04/16/2020 in [place] Office Visit from 01/31/2020 in [facility] Office Visit from 12/19/2019 in [facility]  PHQ-2 Total Score 5 6 6 4 6   PHQ-9 Total Score 23 24 20 16 25       Assessment and Plan:  Start intensive outpatient programming Continue medications as directed  Treatment plan was reviewed and agreed upon by NP T. Bobby Rumpf and patient [name] need for group services   Derrill Center, NP 1/25/202210:49 AM

## 2020-06-11 NOTE — Progress Notes (Signed)
Virtual Visit via Video Note  I connected with [name] on 06/11/20 at 12:00 PM EST by a video enabled telemedicine application and verified that I am speaking with the correct person using two identifiers.  At orientation to the IOP program, Case Manager discussed the limitations of evaluation and management by telemedicine and the availability of in person appointments. The patient expressed understanding and agreed to proceed with virtual visits throughout the duration of the program.   Location:  Patient: Patient Home Provider: Home Office   History of Present Illness: Bipolar 1 DO  Observations/Objective: Check In: Case Manager checked in with all participants to review discharge dates, insurance authorizations, work-related documents and needs from the treatment team. Client stated needs and engaged in discussion.      Initial Therapeutic Activity: Counselor facilitated a group processing with group members to assess mood and current functioning. Counselor prompted group members to share about new life stressors, utilization of coping skills, and progress in treatment. Client reports that she "survived her first night unsupervised". Client stated that she filled her time with [place] a friend for 3 hours, coloring and watching a series on TV. Client reports being proud of herself because she was tempted to drink alcohol, specifically wine to pass time, but she chose to not purchase it in the store. Client noted growth in her thought processes and has a desire to continue making healthier choices. Client presents with moderate depression and moderate anxiety. Client denied any current SI/HI/psychosis.   Second Therapeutic Activity: Counselor prompted group members to reflect on recurrent negative or unhealthy thoughts they've noticed by making a list for 3-5 minutes. Group members shared their reflections aloud, with Client stating that she has always been a negative thinker and  person, thinking statements like, "I'm worthless", "I'm not enough", "I can't be helped". Counselor provided Group with a video highlighting 15 Cognitive Distortions. Counselor prompted group members to note which styles of distorted thinking they notice most internally. Client identified that the styles she struggles with are all that were mention. Client's mood shifted to looking "down and depressed", as we discussed thinking styles. Counselor and group normalized and encouraged her in the ability to make small changes in thinking for more positive outcome. Counselor provided group with 5 cognitive restructuring worksheets to complete for homework to rework negative/unhealthy thoughts to more positive and rational.  Check Out: Counselor closed program by allowing time to celebrate a graduating group member. Counselor shared reflections on progress and allow space for group members to share well wishes and appreciates to the graduating client. Counselor prompted graduating client to share takeaways, reflect on progress and final thoughts for the group. Client endorsed safety plan to be followed to prevent safety issues.  Assessment and Plan: Clinician recommends that Client remain in IOP treatment to better manage mental health symptoms, stabilization and to address treatment plan goals. Clinician recommends adherence to crisis/safety plan, taking medications as prescribed, and following up with medical professionals if any issues arise.   Follow Up Instructions: Clinician will send Webex link for next session. The Client was advised to call back or seek an in-person evaluation if the symptoms worsen or if the condition fails to improve as anticipated.     I provided 180 minutes of non-face-to-face time during this encounter.     Lise Auer, LCSW

## 2020-06-12 ENCOUNTER — Other Ambulatory Visit (HOSPITAL_COMMUNITY): Payer: No Typology Code available for payment source | Admitting: Psychiatry

## 2020-06-12 ENCOUNTER — Ambulatory Visit (HOSPITAL_COMMUNITY): Payer: No Typology Code available for payment source

## 2020-06-12 ENCOUNTER — Encounter (HOSPITAL_COMMUNITY): Payer: Self-pay

## 2020-06-12 DIAGNOSIS — F603 Borderline personality disorder: Secondary | ICD-10-CM

## 2020-06-12 DIAGNOSIS — F3162 Bipolar disorder, current episode mixed, moderate: Secondary | ICD-10-CM

## 2020-06-12 NOTE — Progress Notes (Signed)
Virtual Visit via Video Note  I connected with [name] on 06/12/20 at  9:00 AM EST by a video enabled telemedicine application and verified that I am speaking with the correct person using two identifiers.  At orientation to the IOP program, Case Manager discussed the limitations of evaluation and management by telemedicine and the availability of in person appointments. The patient expressed understanding and agreed to proceed with virtual visits throughout the duration of the program.   Location:  Patient: Patient Home Provider: Home Office   History of Present Illness: Bipolar 1 DO and Borderline Personality DO  Observations/Objective: Check In: Case Manager checked in with all participants to review discharge dates, insurance authorizations, work-related documents and needs from the treatment team. Client stated needs and engaged in discussion.      Initial Therapeutic Activity: Counselor facilitated a group processing with group members to assess mood and current functioning. Counselor prompted group members to share about new life stressors, utilization of coping skills, and progress in treatment. Client reports that she has been using coping skills when agitated or frustrated throughout the day. Taking a minute to pause and regroup has been helpful for her. Client presents with moderate depression and moderate anxiety. Client denied any current SI/HI/psychosis.   Second Therapeutic Activity: Counselor introduced our guest speaker, Einar Grad, [employer] Pharmacist, who shared about psychiatric medications, side effects, treatment considerations and how to communicate with medical professionals. Group Members asked questions and shared medication concerns. Counselor prompted group members to reference a worksheet called, "Body Scan" to jot down questions and concerns about their physical health in preparation for their upcoming appointments with medical professionals. Client identified  that she needs to follow up with neurologist, get her vision checked and see a doctor about her knee. Counselor encouraged routine medical check-ups, preparing for appointments, following up with recommendations and seeking specialist if needed.  Check Out: Counselor closed program prompting group members to share one self-care practice or productivity activity they plan to apply over the coming days. Client chose to celebrate her birthday with her 40 year old and her parents. Client endorsed safety plan to be followed to prevent safety issues.  Assessment and Plan: Clinician recommends that Client remain in IOP treatment to better manage mental health symptoms, stabilization and to address treatment plan goals. Clinician recommends adherence to crisis/safety plan, taking medications as prescribed, and following up with medical professionals if any issues arise.   Follow Up Instructions: Clinician will send Webex link for next session. The Client was advised to call back or seek an in-person evaluation if the symptoms worsen or if the condition fails to improve as anticipated.     I provided 180 minutes of non-face-to-face time during this encounter.     Lise Auer, LCSW

## 2020-06-13 ENCOUNTER — Encounter (HOSPITAL_COMMUNITY): Payer: Self-pay

## 2020-06-13 ENCOUNTER — Other Ambulatory Visit (HOSPITAL_COMMUNITY): Payer: No Typology Code available for payment source | Admitting: Psychiatry

## 2020-06-13 ENCOUNTER — Other Ambulatory Visit: Payer: Self-pay

## 2020-06-13 ENCOUNTER — Ambulatory Visit (HOSPITAL_COMMUNITY): Payer: No Typology Code available for payment source

## 2020-06-13 DIAGNOSIS — F3162 Bipolar disorder, current episode mixed, moderate: Secondary | ICD-10-CM | POA: Diagnosis not present

## 2020-06-13 DIAGNOSIS — F603 Borderline personality disorder: Secondary | ICD-10-CM

## 2020-06-13 NOTE — Progress Notes (Signed)
Virtual Visit via Video Note  I connected with [name] on 06/13/20 at  9:00 AM EST by a video enabled telemedicine application and verified that I am speaking with the correct person using two identifiers.  At orientation to the IOP program, Case Manager discussed the limitations of evaluation and management by telemedicine and the availability of in person appointments. The patient expressed understanding and agreed to proceed with virtual visits throughout the duration of the program.   Location:  Patient: Patient Home Provider: Home Office   History of Present Illness: Bipolar 1 DO and Borderline Personality Disorder  Observations/Objective: Check In: Case Manager checked in with all participants to review discharge dates, insurance authorizations, work-related documents and needs from the treatment team. Client stated needs and engaged in discussion.      Initial Therapeutic Activity: Counselor introduced Rolin Barry, [place] Chaplain to present information and discussion on Grief and Loss. Group members engaged in discussion, sharing how grief impacts them, what comforts them, what emotions are felt, labeling losses, etc. After guest speaker logged off, Counselor prompted group to spend 10-15 minutes journaling to process personal grief and loss situations. Counselor processed entries with group and client's identified areas for additional processing in individual therapy. Client noted that she has done significant work on herself, no longer hiding behind her mental health diagnosis. Client is working toward remove identity of only thinking negative of self and world. Client coping with loss of relationship.   Second Therapeutic Activity: Counselor facilitated a group processing with group members to assess mood and current functioning. Counselor prompted group members to share about new life stressors, utilization of coping skills, and progress in treatment. Client reports that she  enjoyed her birthday with her daughter and getting to spend some alone time doing things she enjoys. Client presents with moderate depression and moderate anxiety. Client denied any current SI/HI/psychosis.  Check Out: Counselor closed program prompting group members to share one self-care practice or productivity activity they plan to apply today. Client plans to start creating a transition plan from living with her parents to living on her own again, post hospitalization and intensive treatment. Client endorsed safety plan to be followed to prevent safety issues.  Assessment and Plan: Clinician recommends that Client remain in IOP treatment to better manage mental health symptoms, stabilization and to address treatment plan goals. Clinician recommends adherence to crisis/safety plan, taking medications as prescribed, and following up with medical professionals if any issues arise.   Follow Up Instructions: Clinician will send Webex link for next session. The Client was advised to call back or seek an in-person evaluation if the symptoms worsen or if the condition fails to improve as anticipated.     I provided 180 minutes of non-face-to-face time during this encounter.     Lise Auer, LCSW

## 2020-06-14 ENCOUNTER — Other Ambulatory Visit (HOSPITAL_COMMUNITY): Payer: No Typology Code available for payment source | Admitting: Psychiatry

## 2020-06-14 ENCOUNTER — Other Ambulatory Visit: Payer: Self-pay

## 2020-06-14 ENCOUNTER — Encounter (HOSPITAL_COMMUNITY): Payer: Self-pay

## 2020-06-14 ENCOUNTER — Ambulatory Visit (HOSPITAL_COMMUNITY): Payer: No Typology Code available for payment source

## 2020-06-14 DIAGNOSIS — F3162 Bipolar disorder, current episode mixed, moderate: Secondary | ICD-10-CM

## 2020-06-14 DIAGNOSIS — F603 Borderline personality disorder: Secondary | ICD-10-CM

## 2020-06-14 NOTE — Progress Notes (Signed)
Virtual Visit via Video Note  I connected with [name] on 06/14/20 at  9:00 AM EST by a video enabled telemedicine application and verified that I am speaking with the correct person using two identifiers.  At orientation to the IOP program, Case Manager discussed the limitations of evaluation and management by telemedicine and the availability of in person appointments. The patient expressed understanding and agreed to proceed with virtual visits throughout the duration of the program.   Location:  Patient: Patient Home Provider: Home Office   History of Present Illness: Bipolar 1 DO and Borderline PD  Observations/Objective: Check In: Case Manager checked in with all participants to review discharge dates, insurance authorizations, work-related documents and needs from the treatment team. Client stated needs and engaged in discussion.      Initial Therapeutic Activity: Counselor engaged group in an ice breaker activity, as there have been several new group members added this week, as way of engaging in the joining process. Counselor prompted group to answer a "Travel: Would You Rather" trivia questions. Group members shared and explored each other's responses. Client shared about the comfort items she would need for a trip and discussed her fears. Counselor promoted coping skills of mental escape, planning a trip, exploring comfort items and self-care.   Second Therapeutic Activity: Counselor facilitated a group processing with group members to assess mood and current functioning. Counselor prompted group members to share about new life stressors, utilization of coping skills, and progress in treatment. Client reports that she is dealing with feelings of regret, remorse and questioning in relation to her recent break. Client processed thoughts and feelings. Counselor reviewed the stages of grief and client identified that she is moving from the anger stage to the depressed/acceptance  stage. Client presents with moderate depression and moderate anxiety. Client denied any current SI/HI/psychosis.  Check Out: Counselor closed program by allowing time to celebrate a graduating group member. Counselor shared reflections on progress and allow space for group members to share well wishes and appreciates to the graduating client. Counselor prompted graduating client to share takeaways, reflect on progress and final thoughts for the group. Client endorsed safety plan to be followed to prevent safety issues.  Assessment and Plan: Clinician recommends that Client remain in IOP treatment to better manage mental health symptoms, stabilization and to address treatment plan goals. Clinician recommends adherence to crisis/safety plan, taking medications as prescribed, and following up with medical professionals if any issues arise.   Follow Up Instructions: Clinician will send Webex link for next session. The Client was advised to call back or seek an in-person evaluation if the symptoms worsen or if the condition fails to improve as anticipated.     I provided 180 minutes of non-face-to-face time during this encounter.     Lise Auer, LCSW

## 2020-06-17 ENCOUNTER — Ambulatory Visit (HOSPITAL_COMMUNITY): Payer: No Typology Code available for payment source

## 2020-06-17 ENCOUNTER — Other Ambulatory Visit: Payer: Self-pay

## 2020-06-17 ENCOUNTER — Other Ambulatory Visit (HOSPITAL_COMMUNITY): Payer: No Typology Code available for payment source | Admitting: Psychiatry

## 2020-06-17 DIAGNOSIS — F3162 Bipolar disorder, current episode mixed, moderate: Secondary | ICD-10-CM | POA: Diagnosis not present

## 2020-06-17 DIAGNOSIS — F603 Borderline personality disorder: Secondary | ICD-10-CM

## 2020-06-18 ENCOUNTER — Other Ambulatory Visit: Payer: Self-pay

## 2020-06-18 ENCOUNTER — Other Ambulatory Visit (HOSPITAL_COMMUNITY): Payer: No Typology Code available for payment source | Attending: Psychiatry | Admitting: Psychiatry

## 2020-06-18 ENCOUNTER — Encounter (HOSPITAL_COMMUNITY): Payer: Self-pay

## 2020-06-18 ENCOUNTER — Ambulatory Visit (HOSPITAL_COMMUNITY): Payer: No Typology Code available for payment source

## 2020-06-18 DIAGNOSIS — F411 Generalized anxiety disorder: Secondary | ICD-10-CM | POA: Insufficient documentation

## 2020-06-18 DIAGNOSIS — F329 Major depressive disorder, single episode, unspecified: Secondary | ICD-10-CM | POA: Insufficient documentation

## 2020-06-18 DIAGNOSIS — Z9151 Personal history of suicidal behavior: Secondary | ICD-10-CM | POA: Insufficient documentation

## 2020-06-18 DIAGNOSIS — F603 Borderline personality disorder: Secondary | ICD-10-CM | POA: Insufficient documentation

## 2020-06-18 DIAGNOSIS — F3162 Bipolar disorder, current episode mixed, moderate: Secondary | ICD-10-CM

## 2020-06-18 DIAGNOSIS — F319 Bipolar disorder, unspecified: Secondary | ICD-10-CM | POA: Diagnosis not present

## 2020-06-18 DIAGNOSIS — F32A Depression, unspecified: Secondary | ICD-10-CM | POA: Diagnosis present

## 2020-06-18 NOTE — Progress Notes (Unsigned)
Virtual Visit via Video Note  I connected with [name] on 06/18/20 at  9:00 AM EST by a video enabled telemedicine application and verified that I am speaking with the correct person using two identifiers.  At orientation to the IOP program, Case Manager discussed the limitations of evaluation and management by telemedicine and the availability of in person appointments. The patient expressed understanding and agreed to proceed with virtual visits throughout the duration of the program.   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 Client stated needs and engaged in discussion. Case Manager welcomed new Client to the group, as group members introduced selves and started the joining process.     Initial Therapeutic Activity: Counselor facilitated a group processing with group members to assess mood and current functioning. Counselor prompted group members to share about new life stressors, utilization of coping skills, and progress in treatment. Client reports that she is feeling overwhelmed about her transition back to her home, anticipating trauma and grief triggers. Client shared history of the significance of her house and how the infidelity causes mixed emotions. Client and group identified strategies for her return home and reclaiming her space. Client intentionally engaging in art projects and communicating with supports to combat anxiety and depression. Client presents with moderate depression and moderate anxiety. Client denied any current SI/HI/psychosis.   Second Therapeutic Activity: Counselor engaged new group members in creating an ECOMap to depict the current dynamics within their support systems and the other systems they have  interactions, such as mental health, healthcare, work, recreation, etc, that we started yesterday.  Counselor gave instructions, including labeling status of relationships, energy flow and boundary types. Counselor prompted new group members to work on their maps while other group members shared reflections on activity. Client stated that she has a good support system who has been understanding of her recovery. Client would like to give energy back to them eventually when she is in a better.  Check Out: Counselor prompted group members to identify one self-care practice or productivity activity they would like to engage in today. Client plans to spend time with her daughter and talk to her mom about a transition plan for gradually returning home. Counselor closed program by allowing time to celebrate a graduating group member. Counselor shared reflections on progress and allow space for group members to share well wishes and appreciates to the graduating client. Counselor prompted graduating client to share takeaways, reflect on progress and final thoughts for the group. Client endorsed safety plan to be followed to prevent safety issues.  Assessment and Plan: Clinician recommends that Client remain in IOP treatment to better manage mental health symptoms, stabilization and to address treatment plan goals. Clinician recommends adherence to crisis/safety plan, taking medications as prescribed, and following up with medical professionals if any issues arise.   Follow Up Instructions: Clinician will send Webex link for next session. The Client was advised to call back or seek an in-person evaluation if the symptoms worsen or if the condition fails to improve as anticipated.     I provided 180 minutes of non-face-to-face time during this encounter.     Lise Auer, LCSW

## 2020-06-18 NOTE — Progress Notes (Signed)
Virtual Visit via Video Note  I connected with [name] on @TODAY @ at  9:00 AM EST by a video enabled telemedicine application and verified that I am speaking with the correct person using two identifiers.  At orientation to the IOP program, Case Manager discussed the limitations of evaluation and management by telemedicine and the availability of in person appointments. The patient expressed understanding and agreed to proceed with virtual visits throughout the duration of the program.   Location:  Patient: Patient Home Provider: Home Office   History of Present Illness: Bipolar 1 DO and Borderline PD  Observations/Objective: Check In: Case Manager checked in with all participants to review discharge dates, insurance authorizations, work-related documents and needs from the treatment team. Client stated needs and engaged in discussion.      Initial Therapeutic Activity: Counselor facilitated a group processing with group members to assess mood and current functioning. Counselor prompted group members to share about new life stressors, utilization of coping skills, and progress in treatment. Client reports that she maintained well over the weekend, until she had an emotional reaction towards her trigger. Client processed thoughts and feelings related to the event. Client engaging ne negative massaging and self-doubt. Counselor and group challenged negative cognitions and encouraged Client to extend self-compassion, validated feelings and identified cognitive coping strategies. Client presents with moderate depression and moderate anxiety. Client denied any current SI/HI/psychosis.   Second Therapeutic Activity: Counselor engaged the group in creating an ECOMap to depict the current dynamics within their support systems and the other systems they have interactions, such as mental health, healthcare, work, recreation, Social research officer, government. Counselor gave instructions, including labeling status of  relationships, energy flow and boundary types. Counselor prompted group members to share reflections on activity, however, there was only time for one group member to share. We will start group with remaining reflections tomorrow. Client engaged in activity.  Check Out: Counselor prompted group members to identify one self-care practice or productivity activity they would like to engage in today. Client plans to make phone calls to determine return to work and then rest. Client endorsed safety plan to be followed to prevent safety issues.  Assessment and Plan: Clinician recommends that Client remain in IOP treatment to better manage mental health symptoms, stabilization and to address treatment plan goals. Clinician recommends adherence to crisis/safety plan, taking medications as prescribed, and following up with medical professionals if any issues arise.    Follow Up Instructions: Clinician will send Webex link for next session. The Client was advised to call back or seek an in-person evaluation if the symptoms worsen or if the condition fails to improve as anticipated.     I provided 180 minutes of non-face-to-face time during this encounter.     Lise Auer, LCSW

## 2020-06-19 ENCOUNTER — Ambulatory Visit (HOSPITAL_COMMUNITY): Payer: No Typology Code available for payment source

## 2020-06-19 ENCOUNTER — Other Ambulatory Visit: Payer: Self-pay

## 2020-06-19 ENCOUNTER — Encounter (HOSPITAL_COMMUNITY): Payer: Self-pay

## 2020-06-19 ENCOUNTER — Other Ambulatory Visit (HOSPITAL_COMMUNITY): Payer: No Typology Code available for payment source | Admitting: Psychiatry

## 2020-06-19 DIAGNOSIS — F3162 Bipolar disorder, current episode mixed, moderate: Secondary | ICD-10-CM

## 2020-06-19 DIAGNOSIS — F329 Major depressive disorder, single episode, unspecified: Secondary | ICD-10-CM | POA: Diagnosis not present

## 2020-06-19 NOTE — Progress Notes (Signed)
Virtual Visit via Video Note  I connected with [name] on 06/19/20 at  9:00 AM EST by a video enabled telemedicine application and verified that I am speaking with the correct person using two identifiers.  At orientation to the IOP program, Case Manager discussed the limitations of evaluation and management by telemedicine and the availability of in person appointments. The patient expressed understanding and agreed to proceed with virtual visits throughout the duration of the program.   Location:  Patient: Patient Home Provider: Home Office   History of Present Illness: Bipolar 1 DO  Observations/Objective: Check In: Case Manager checked in with all participants to review discharge dates, insurance authorizations, work-related documents and needs from the treatment team. Client stated needs and engaged in discussion. Case Manager welcomed new Client to the group, as group members introduced selves and started the joining process.     Initial Therapeutic Activity: Counselor introduced Rolin Barry, [place] Chaplain to present information and discussion on Grief and Loss. Group members engaged in discussion, sharing how grief impacts them, what comforts them, what emotions are felt, labeling losses, etc. After guest speaker logged off, Counselor prompted group to spend 10-15 minutes journaling to process personal grief and loss situations. Counselor processed entries with group and client's identified areas for additional processing in individual therapy.    Second Therapeutic Activity: Counselor facilitated a group processing with group members to assess mood and current functioning. Counselor prompted group members to share about new life stressors, utilization of coping skills, and progress in treatment. Client reports that she had a challenging day yesterday. Client shared about returning to her home for the first time in months. She reports having a panic attack, being overcome with  emotion. Client shared how she accepting help and support from friends and family to regulate emotions Client has mixed emotions about returning again. Client presents with moderate depression and moderate anxiety. Client denied any current SI/HI/psychosis.  Check Out: Counselor prompted group members to identify one self-care practice or productivity activity they would like to engage in today. Client plans to talk to parents about a plan to slowly transition back home with support. Client endorsed safety plan to be followed to prevent safety issues.  Assessment and Plan: Clinician recommends that Client remain in IOP treatment to better manage mental health symptoms, stabilization and to address treatment plan goals. Clinician recommends adherence to crisis/safety plan, taking medications as prescribed, and following up with medical professionals if any issues arise.   Follow Up Instructions: Clinician will send Webex link for next session. The Client was advised to call back or seek an in-person evaluation if the symptoms worsen or if the condition fails to improve as anticipated.     I provided 180 minutes of non-face-to-face time during this encounter.     Lise Auer, LCSW

## 2020-06-20 ENCOUNTER — Other Ambulatory Visit (HOSPITAL_COMMUNITY): Payer: No Typology Code available for payment source | Admitting: Psychiatry

## 2020-06-20 ENCOUNTER — Ambulatory Visit (HOSPITAL_COMMUNITY): Payer: No Typology Code available for payment source

## 2020-06-20 ENCOUNTER — Encounter (HOSPITAL_COMMUNITY): Payer: Self-pay

## 2020-06-20 ENCOUNTER — Other Ambulatory Visit: Payer: Self-pay

## 2020-06-20 DIAGNOSIS — F3162 Bipolar disorder, current episode mixed, moderate: Secondary | ICD-10-CM

## 2020-06-20 DIAGNOSIS — F329 Major depressive disorder, single episode, unspecified: Secondary | ICD-10-CM | POA: Diagnosis not present

## 2020-06-20 DIAGNOSIS — F603 Borderline personality disorder: Secondary | ICD-10-CM

## 2020-06-20 NOTE — Progress Notes (Signed)
Virtual Visit via Video Note  I connected with [name] on 06/20/20 at  9:00 AM EST by a video enabled telemedicine application and verified that I am speaking with the correct person using two identifiers.  At orientation to the IOP program, Case Manager discussed the limitations of evaluation and management by telemedicine and the availability of in person appointments. The patient expressed understanding and agreed to proceed with virtual visits throughout the duration of the program.   Location:  Patient: Patient Home Provider: Home Office   History of Present Illness: Bipolar I DO  Observations/Objective: Check In: Case Manager checked in with all participants to review discharge dates, insurance authorizations, work-related documents and needs from the treatment team. [name] stated needs and engaged in discussion. Case Manager welcomed new [name] to the group, as group members introduced selves and started the joining process.     Initial Therapeutic Activity: Counselor facilitated a group processing with group members to assess mood and current functioning. Counselor prompted group members to share about new life stressors, utilization of coping skills, and progress in treatment. [name] reports that she may have the onset of a manic episode, based on decision making yesterday. [name] reports that she decided to sell her home yesterday, due to the trauma related triggers. Counselor assessed decision making process, where [name] states she included her mother in the conversation and process. [name] content with her decision and plan for moving forward in a healthier way for her and her daughter. [name] presents with mild depression and moderate anxiety. [name] denied any current SI/HI/psychosis.   Second Therapeutic Activity: Counselor engaged group in a discussion on their depressive and anxiety symptoms/experiences. Counselor prompted group members to visually depict how their  symptoms differ and overlap. Counselor prompted each to share their reflections. [name] stated that their overlap occurs with trauma responses and triggers, shutting down, negative cognitions and impulsivity. Counselor introduced a video on how avoidance is a symptom of both anxiety and depression by Anne Fu, Therapy is a Nutshell. [name] shared takeaways from video by sharing that she relates to avoidance and has challenged herself more recently to take action.  Check Out: Counselor prompted group members to identify one self-care practice or productivity activity they would like to engage in today. [name] plans to house hunt. [name] endorsed safety plan to be followed to prevent safety issues.  Assessment and Plan: Clinician recommends that [name] remain in IOP treatment to better manage mental health symptoms, stabilization and to address treatment plan goals. Clinician recommends adherence to crisis/safety plan, taking medications as prescribed, and following up with medical professionals if any issues arise.   Follow Up Instructions: Clinician will send Webex link for next session. The [name] was advised to call back or seek an in-person evaluation if the symptoms worsen or if the condition fails to improve as anticipated.     I provided 180 minutes of non-face-to-face time during this encounter.     Lise Auer, LCSW

## 2020-06-21 ENCOUNTER — Other Ambulatory Visit: Payer: Self-pay

## 2020-06-21 ENCOUNTER — Ambulatory Visit (HOSPITAL_COMMUNITY): Payer: No Typology Code available for payment source

## 2020-06-21 ENCOUNTER — Other Ambulatory Visit (HOSPITAL_COMMUNITY): Payer: Self-pay | Admitting: Psychiatry

## 2020-06-21 ENCOUNTER — Other Ambulatory Visit (HOSPITAL_COMMUNITY): Payer: No Typology Code available for payment source | Admitting: Psychiatry

## 2020-06-21 DIAGNOSIS — F329 Major depressive disorder, single episode, unspecified: Secondary | ICD-10-CM | POA: Diagnosis not present

## 2020-06-21 DIAGNOSIS — F3162 Bipolar disorder, current episode mixed, moderate: Secondary | ICD-10-CM

## 2020-06-23 NOTE — Psych (Signed)
Virtual Visit via Video Note  I connected with [name] on 06/07/20 at  9:00 AM EST by a video enabled telemedicine application and verified that I am speaking with the correct person using two identifiers.  Location: Patient: patient home Provider: clinical home office   I discussed the limitations of evaluation and management by telemedicine and the availability of in person appointments. The patient expressed understanding and agreed to proceed.  I discussed the assessment and treatment plan with the patient. The patient was provided an opportunity to ask questions and all were answered. The patient agreed with the plan and demonstrated an understanding of the instructions.   The patient was advised to call back or seek an in-person evaluation if the symptoms worsen or if the condition fails to improve as anticipated.  Pt was provided 240 minutes of non-face-to-face time during this encounter.   Lorin Glass, LCSW    [facility] PHP THERAPIST PROGRESS NOTE  [name] [id number]  Session Time: 9:00 - 10:00  Participation Level: Active  Behavioral Response: CasualAlertDepressed  Type of Therapy: Group Therapy  Treatment Goals addressed: Coping  Interventions: CBT, DBT, Supportive and Reframing  Summary: Clinician led check-in regarding current stressors and situation. Clinician utilized active listening and empathetic response and validated patient emotions. Clinician facilitated processing group on pertinent issues.   Therapist Response: [name] is a 40 y.o. female who presents with depression and mood symptoms. Patient arrived within time allowed and reports that she is feeling "pretty good." Patient rates hermood at a 7on a scale of 1-10 with 10 being great. Pt reports a mixed mood afternoon, reporting 3 "breakdowns" and also having an "epiphany" and utilizing strong coping and perspective shifts through those breakdowns. Pt reports experiencing hopelessness  during the breakdowns and being able to reframe.Pt reports feeling pride in the way she handled herself.  Pt able to process. Pt engaged in discussion.       Session Time: 10:00 - 11:00   Participation Level:Active  Behavioral Response:CasualAlertDepressed  Type of Therapy:GroupTherapy  Treatment Goals addressed: Coping  Interventions:CBT, DBT, Supportive and Reframing  Summary:Cln introduced topic of stress management and the model of the "4 A's of stress management:" avoid, alter, accept, and adapt. Group members worked through Advice worker and discussed barriers to utilizing the 4 A's for stressors.   Therapist Response: Pt engaged in discussion and reports understanding of how to utilize the 4 A's.      Session Time: 11:00- 12:00  Participation Level:Active  Behavioral Response:CasualAlertDepressed  Type of Therapy: Group Therapy, OT  Treatment Goals addressed: Coping  Interventions:Psychosocial skills training, Supportive  Summary:Occupational Therapy group  Therapist Response:Patient engaged in group. See OT note.      Session Time: 12:00 -1:00  Participation Level:Active  Behavioral Response:CasualAlertDepressed  Type of Therapy:Group therapy  Treatment Goals addressed: Coping  Interventions:CBT; Solution focused; Supportive; Reframing  Summary:12:00 - 12:50: Cln continued topic of boundaries and led a "boundary workshop" in which group members brought current boundary issues and group worked together to apply boundary concepts to help address the concern. Cln helped shape conversation to maintain fidelity.  12:50 -1:00 Clinician led check-out. Clinician assessed for immediate needs, medication compliance and efficacy, and safety concerns  Therapist Response:12:00 - 12:50: Pt engaged in discussion and shared a current boundary issue and reports gaining insight. 12:50 - 1:00: At check-out, patientrates  hermood at [facility] on a scale of 1-10 with 10 being great.Pt reports afternoon plans ofshopping. Pt demonstrates some progress  as evidenced by coping through a difficult situation. Patient denies SI/HI at the end of group.    Suicidal/Homicidal: Nowithout intent/plan   Plan: Pt will discharge from PHP due to meeting treatment goals of decreased depression symptoms, increased emotion regulation ability, and increased ability to manage symptoms in a healthy manner. Pt will step down to IOP within this agency to continue to increase stability. Pt and provider are aligned with discharge. Pt denies SI/HI at time of discharge.   Diagnosis: MDD (major depressive disorder), recurrent severe, without psychosis ([place]) [F33.2]    1. MDD (major depressive disorder), recurrent severe, without psychosis ([place])   2. Borderline personality disorder ([place])       Lorin Glass, LCSW 06/23/2020

## 2020-06-23 NOTE — Psych (Signed)
Virtual Visit via Video Note  I connected with [name] on 06/06/20 at  9:00 AM EST by a video enabled telemedicine application and verified that I am speaking with the correct person using two identifiers.  Location: Patient: patient home Provider: clinical home office   I discussed the limitations of evaluation and management by telemedicine and the availability of in person appointments. The patient expressed understanding and agreed to proceed.  I discussed the assessment and treatment plan with the patient. The patient was provided an opportunity to ask questions and all were answered. The patient agreed with the plan and demonstrated an understanding of the instructions.   The patient was advised to call back or seek an in-person evaluation if the symptoms worsen or if the condition fails to improve as anticipated.  Pt was provided 240 minutes of non-face-to-face time during this encounter.   Stacy Glass, LCSW    [facility] PHP THERAPIST PROGRESS NOTE  [name] [id number]  Session Time: 9:00 - 10:00  Participation Level: Active  Behavioral Response: CasualAlertDepressed  Type of Therapy: Group Therapy  Treatment Goals addressed: Coping  Interventions: CBT, DBT, Supportive and Reframing  Summary: Clinician led check-in regarding current stressors and situation. Clinician utilized active listening and empathetic response and validated patient emotions. Clinician facilitated processing group on pertinent issues.   Therapist Response: [name] is a 40 y.o. female who presents with depression and mood symptoms. Patient arrived within time allowed and reports that she is feeling "not great." Patient rates hermood at a5.5on a scale of 1-10 with 10 being great. Pt reports she is feeling ill today and struggles with congestion. Pt reports impaired sleeping due to sickness. Pt reports she worked on a project with her dad. Pt reports continued intermittent SI and  states it is becoming easier for her to move through those thoughts. Pt reports struggling with showering regularly.  Pt able to process. Pt engaged in discussion.       Session Time: 10:00 - 11:00   Participation Level:Active  Behavioral Response:CasualAlertDepressed  Type of Therapy:GroupTherapy  Treatment Goals addressed: Coping  Interventions:CBT, DBT, Supportive and Reframing  Summary:Cln introduced CBT and the way in which it can provide context for addressing stumbling blocks. Group discussed "the problem is not the problem, the problem is how we're thinking about the problem" and tried to change perspective on current struggles.   Therapist Response: Pt engaged in discussion and is able to attempt reframing using CBT.      Session Time: 11:00- 12:00  Participation Level:Active  Behavioral Response:CasualAlertDepressed  Type of Therapy: Group Therapy, OT  Treatment Goals addressed: Coping  Interventions:Psychosocial skills training, Supportive  Summary:Occupational Therapy group  Therapist Response:Patient engaged in group. See OT note.      Session Time: 12:00 -1:00  Participation Level:Active  Behavioral Response:CasualAlertDepressed  Type of Therapy:Group therapy  Treatment Goals addressed: Coping  Interventions:CBT; Solution focused; Supportive; Reframing  Summary:12:00 - 12:50: Cln continued topic of boundaries and introduced how to set and maintain healthy boundaries. Cln utilized handout "how to set boundaries" and group members worked through examples to practice setting appropriate boundaries.  12:50 -1:00 Clinician led check-out. Clinician assessed for immediate needs, medication compliance and efficacy, and safety concerns  Therapist Response:12:00 - 12:50: Pt engaged in discussion and reports struggle with maintaining boundaries.  12:50 - 1:00: At check-out, patientrates hermood at a5 on a  scale of 1-10 with 10 being great.Pt reports afternoon plans ofpicking up her daughter and  showering. Pt demonstrates some progress as evidenced by progress on negative thinking. Patient denies SI/HI at the end of group.    Suicidal/Homicidal: Nowithout intent/plan   Plan: Pt will continue in PHP while working to decrease depression symptoms, increase emotion regulation ability, and increase ability to manage symptoms in a healthy manner.   Diagnosis: MDD (major depressive disorder), recurrent severe, without psychosis ([place]) [F33.2]    1. MDD (major depressive disorder), recurrent severe, without psychosis ([place])   2. Borderline personality disorder ([place])       Stacy Glass, LCSW 06/23/2020

## 2020-06-24 ENCOUNTER — Other Ambulatory Visit: Payer: Self-pay

## 2020-06-24 ENCOUNTER — Telehealth (INDEPENDENT_AMBULATORY_CARE_PROVIDER_SITE_OTHER): Payer: No Typology Code available for payment source | Admitting: Physician Assistant

## 2020-06-24 ENCOUNTER — Telehealth (HOSPITAL_COMMUNITY): Payer: Self-pay | Admitting: Psychiatry

## 2020-06-24 ENCOUNTER — Other Ambulatory Visit (HOSPITAL_COMMUNITY): Payer: No Typology Code available for payment source | Admitting: Psychiatry

## 2020-06-24 ENCOUNTER — Encounter: Payer: Self-pay | Admitting: Physician Assistant

## 2020-06-24 ENCOUNTER — Encounter (HOSPITAL_COMMUNITY): Payer: Self-pay

## 2020-06-24 VITALS — Temp 98.6°F | Ht 64.0 in | Wt 168.0 lb

## 2020-06-24 DIAGNOSIS — F603 Borderline personality disorder: Secondary | ICD-10-CM | POA: Diagnosis not present

## 2020-06-24 DIAGNOSIS — F411 Generalized anxiety disorder: Secondary | ICD-10-CM

## 2020-06-24 DIAGNOSIS — F3162 Bipolar disorder, current episode mixed, moderate: Secondary | ICD-10-CM | POA: Diagnosis not present

## 2020-06-24 NOTE — Progress Notes (Signed)
PHQ9 (10) -GAD7 (18) completed.   Due back to work 06/30/2020, but wants to extend this longer.

## 2020-06-24 NOTE — Progress Notes (Signed)
Virtual Visit via Video Note  I connected with [name] on 06/24/20 at  9:00 AM EST by a video enabled telemedicine application and verified that I am speaking with the correct person using two identifiers.  At orientation to the IOP program, Case Manager discussed the limitations of evaluation and management by telemedicine and the availability of in person appointments. The patient expressed understanding and agreed to proceed with virtual visits throughout the duration of the program.   Location:  Patient: Patient Home Provider: Home Office   History of Present Illness: Bipolar 1 DO  Observations/Objective: Check In: Case Manager checked in with all participants to review discharge dates, insurance authorizations, work-related documents and needs from the treatment team. Client stated needs and engaged in discussion. Case Manager welcomed new Client to the group, as group members introduced selves and started the joining process.  Initial Therapeutic Activity: Counselor facilitated a group processing with group members to assess mood and current functioning. Counselor prompted group members to share about new life stressors, utilization of coping skills, and progress in treatment. Client reports that she opened up to her dad about recent decisions and that they are working together to better meet her life goals. Client shared about complicated history in her and her fathers relationship. Client would like to understand and grow closer with him over time. Client presents with moderate depression and moderate anxiety. Client denied any current SI/HI/psychosis.   Second Therapeutic Activity: Counselor introduced [employer], representative with [facility] to share about programming. Group Members asked questions and engaged in discussion, as [name] shared about Peer Support, Support Groups and the [employer]. Client stated that they are interested in connecting with the  one on one peer support and many of the courses. Client's shared additional services and resources in the area that they have found helpful in managing their mental health.   Check Out: Counselor prompted group members to identify one self-care practice or productivity activity they would like to engage in today. Client plans to house hunt and plan for future. Client endorsed safety plan to be followed to prevent safety issues.  Assessment and Plan: Clinician recommends that Client remain in IOP treatment to better manage mental health symptoms, stabilization and to address treatment plan goals. Clinician recommends adherence to crisis/safety plan, taking medications as prescribed, and following up with medical professionals if any issues arise.   Follow Up Instructions: Clinician will send Webex link for next session. The Client was advised to call back or seek an in-person evaluation if the symptoms worsen or if the condition fails to improve as anticipated.     I provided 180 minutes of non-face-to-face time during this encounter.     Lise Auer, LCSW

## 2020-06-24 NOTE — Progress Notes (Signed)
Patient ID: [name], female   DOB: 1981/04/24, 40 y.o.   MRN: [id number] .[name] KitchenVirtual Visit via Video Note  I connected with [name] on 06/24/2020 at  3:20 PM EST by a video enabled telemedicine application and verified that I am speaking with the correct person using two identifiers.  Location: Patient: home Provider: clinic  .[name] KitchenParticipating in visit:  Patient: [place] Provider: Iran Planas PA-C   I discussed the limitations of evaluation and management by telemedicine and the availability of in person appointments. The patient expressed understanding and agreed to proceed.  History of Present Illness: Patient is a 40 year old female with bipolar 1 mixed, GAD, borderline personality disorder who calls into the clinic for follow-up.  Tomorrow supposed to be her last day of outpatient intensive therapy.  They are extending her therapy for another week or so.  She needs her work note extended.  The intensive therapy last all morning.  She does have a psychiatrist and she will meet with him on February 14.  She denies any suicidal thoughts or homicidal idealizations.  She still struggles with irritability, anxiousness, trouble focusing.  Her medications have not been adjusted in a while.  She did have to call her boss for something and she became really jittery.  She is worried about going back to work.  .. Active Ambulatory Problems    Diagnosis Date Noted  . Left breast lump 03/05/2015  . Borderline personality disorder ([place]) 07/09/2015  . Generalized anxiety disorder 07/09/2015  . Bipolar 1 disorder, mixed, moderate ([place]) 07/09/2015  . Iron deficiency anemia 03/20/2016  . Encounter for insertion of intrauterine contraceptive device (IUD) 08/05/2016  . 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 Nevus 03/15/2017  . 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 Worsening headaches 10/13/2019  . Paresthesia 10/13/2019  . Other symptoms and signs involving the nervous system 10/13/2019  . Relationship  problems 01/31/2020  . Bipolar 1 disorder ([place]) 04/22/2020  . MDD (major depressive disorder), recurrent episode, severe ([place]) 04/22/2020   Resolved Ambulatory Problems    Diagnosis Date Noted  . Nipple discharge 03/05/2015  . Depression 07/09/2015  . [redacted] weeks gestation of pregnancy 03/20/2016  . Normal pregnancy 06/03/2016  . Class 1 obesity due to excess calories without serious comorbidity with body mass index (BMI) of 32.0 to 32.9 in adult 04/20/2018  . Upper extremity weakness 10/13/2019   Past Medical History:  Diagnosis Date  . Headache   . History of bipolar disorder   . History of pancreatitis   . Hx of varicella   . Migraines   . Vaginal Pap smear, abnormal    Reviewed med, allergy, problem list.      Observations/Objective: No acute distress Quiet mood Normal appearance No cough or labored breathing  .[name] Today's Vitals   06/24/20 1514  Temp: 98.6 F (37 C)  TempSrc: Oral  Weight: 168 lb (76.2 kg)  Height: 5\' 4"  (1.626 m)   Body mass index is 28.84 kg/m.  .. Depression screen [facility] 2/9 06/24/2020 04/16/2020 01/31/2020 12/19/2019 06/21/2018  Decreased Interest 1 3 1 3 1   Down, Depressed, Hopeless 1 3 3 3 1   PHQ - 2 Score 2 6 4 6 2   Altered sleeping 0 1 1 1 1   Tired, decreased energy 2 3 1 3 1   Change in appetite 1 1 1 3 2   Feeling bad or failure about yourself  1 3 3 3 1   Trouble concentrating 1 1 3 3 1   Moving slowly or fidgety/restless 2 2  3 3 1   Suicidal thoughts 1 3 0 3 0  PHQ-9 Score 10 20 16 25 9   Difficult doing work/chores Somewhat difficult Very difficult Somewhat difficult Very difficult Not difficult at all  Some encounter information is confidential and restricted. Go to Review Flowsheets activity to see all data.  Some recent data might be hidden   .[name] GAD 7 : Generalized Anxiety Score 06/24/2020 04/16/2020 01/31/2020 12/19/2019  Nervous, Anxious, on Edge 3 3 3 3   Control/stop worrying 3 3 3 3   Worry too much - different things 3 3 3 3   Trouble  relaxing 2 3 3 3   Restless 3 3 0 3  Easily annoyed or irritable 2 3 3 3   Afraid - awful might happen 2 3 1 3   Total GAD 7 Score 18 21 16 21   Anxiety Difficulty Somewhat difficult Extremely difficult Very difficult Very difficult       Assessment and Plan: [name] KitchenMarland KitchenJustene was seen today for anxiety.  Diagnoses and all orders for this visit:  Bipolar 1 disorder, mixed, moderate ([facility])  Borderline personality disorder ([place])  Generalized anxiety disorder   Overall she is doing better. Her outpatient intensive therapy was extended and thus extended her work leave through 07/05/2020. She is having some trouble thinking about going to work. Discussed starting with a volunteer opportunity or hobby/craft to make a sell. I do think she would benefit from some task oriented accomplishment outside of the house.  Follow up with [facility] for extended leave and medication adjustments.   Follow Up Instructions:    I discussed the assessment and treatment plan with the patient. The patient was provided an opportunity to ask questions and all were answered. The patient agreed with the plan and demonstrated an understanding of the instructions.   The patient was advised to call back or seek an in-person evaluation if the symptoms worsen or if the condition fails to improve as anticipated.    Iran Planas, PA-C

## 2020-06-25 ENCOUNTER — Ambulatory Visit (HOSPITAL_COMMUNITY): Payer: No Typology Code available for payment source

## 2020-06-25 ENCOUNTER — Other Ambulatory Visit: Payer: Self-pay

## 2020-06-25 ENCOUNTER — Other Ambulatory Visit (HOSPITAL_COMMUNITY): Payer: No Typology Code available for payment source | Admitting: Psychiatry

## 2020-06-25 DIAGNOSIS — F3162 Bipolar disorder, current episode mixed, moderate: Secondary | ICD-10-CM

## 2020-06-25 DIAGNOSIS — F329 Major depressive disorder, single episode, unspecified: Secondary | ICD-10-CM | POA: Diagnosis not present

## 2020-06-26 ENCOUNTER — Encounter (HOSPITAL_COMMUNITY): Payer: Self-pay

## 2020-06-26 ENCOUNTER — Other Ambulatory Visit: Payer: Self-pay

## 2020-06-26 ENCOUNTER — Other Ambulatory Visit (HOSPITAL_COMMUNITY): Payer: No Typology Code available for payment source | Admitting: Psychiatry

## 2020-06-26 DIAGNOSIS — F329 Major depressive disorder, single episode, unspecified: Secondary | ICD-10-CM | POA: Diagnosis not present

## 2020-06-26 DIAGNOSIS — F3162 Bipolar disorder, current episode mixed, moderate: Secondary | ICD-10-CM

## 2020-06-26 NOTE — Progress Notes (Signed)
Virtual Visit via Video Note  I connected with [name] on 06/26/20 at  9:00 AM EST by a video enabled telemedicine application and verified that I am speaking with the correct person using two identifiers.  At orientation to the IOP program, Case Manager discussed the limitations of evaluation and management by telemedicine and the availability of in person appointments. The patient expressed understanding and agreed to proceed with virtual visits throughout the duration of the program.   Location:  Patient: Patient Home Provider: Home Office   History of Present Illness: Bipolar 1 DO and Borderline Personality DO  Observations/Objective: Check In: Case Manager checked in with all participants to review discharge dates, insurance authorizations, work-related documents and needs from the treatment team. Client stated needs and engaged in discussion.   Initial Therapeutic Activity: Counselor facilitated a group processing with group members to assess mood and current functioning. Counselor prompted group members to share about new life stressors, utilization of coping skills, and progress in treatment. Client reports that she [place] experiencing severe social anxiety when outside of group context. Client shared that she shakes, cries, can't form sentences and becomes upset. Counselor discussed social anxiety skills and resources for Client to implement and re-discussed discharge plan and needs. Client presents with moderate depression and severe anxiety. Client denied any current SI/HI/psychosis.  Second Therapeutic Activity: Counselor shared a video by Anne Fu, from Therapy In a Nutshell, on how to rethink how we use coping skills, mainly focusing on the use to calm, relax or regulate self in order to reengage by taking action to combat life stressors. Often time coping skills are used to distract, which easily turns into avoidance. Counselor and group members identified takeaways from the  video, with many highlighting that they have a better understanding of how to use coping skills identified in yesterdays session. Counselor additionally covered grounding and relaxation skills Clients can start practicing when distressed.  Check Out: Counselor prompted group members to identify one self-care practice or productivity activity they would like to engage in today. Client plans to spend time with her daughter and continue house searching. Client endorsed safety plan to be followed to prevent safety issues.  Assessment and Plan: Clinician recommends that Client remain in IOP treatment to better manage mental health symptoms, stabilization and to address treatment plan goals. Clinician recommends adherence to crisis/safety plan, taking medications as prescribed, and following up with medical professionals if any issues arise.   Follow Up Instructions: Clinician will send Webex link for next session. The Client was advised to call back or seek an in-person evaluation if the symptoms worsen or if the condition fails to improve as anticipated.     I provided 180 minutes of non-face-to-face time during this encounter.     Lise Auer, LCSW

## 2020-06-26 NOTE — Progress Notes (Signed)
Virtual Visit via Video Note  I connected with [name] on 06/26/20 at  9:00 AM EST by a video enabled telemedicine application and verified that I am speaking with the correct person using two identifiers.  At orientation to the IOP program, Case Manager discussed the limitations of evaluation and management by telemedicine and the availability of in person appointments. The patient expressed understanding and agreed to proceed with virtual visits throughout the duration of the program.   Location:  Patient: Patient Home Provider: Home Office   History of Present Illness: Bipolar 1 DO  Observations/Objective: Check In: Case Manager checked in with all participants to review discharge dates, insurance authorizations, work-related documents and needs from the treatment team. Client stated needs and engaged in discussion. Counselor facilitated a group processing with group members to assess mood and current functioning. Counselor prompted group members to share about new life stressors, utilization of coping skills, and progress in treatment. Client presents with moderate depression and moderate anxiety. Client denied any current SI/HI/psychosis.  Initial Therapeutic Activity: Counselor introduced our guest speaker, Einar Grad, [facility] Pharmacist, who shared about psychiatric medications, side effects, treatment considerations and how to communicate with medical professionals. Group Members asked questions and shared medication concerns.    Second Therapeutic Activity: Counselor prompted group members to reference a worksheet called, "Body Scan" to jot down questions and concerns about their physical health in preparation for their upcoming appointments with medical professionals. Client in need of new glasses prescription and eye exam. Counselor encouraged routine medical check-ups, preparing for appointments, following up with recommendations and seeking specialist if needed.  Check Out:  Counselor prompted group members to identify one self-care practice or productivity activity they would like to engage in today. Client plans to start moving items from her home. Client endorsed safety plan to be followed to prevent safety issues.  Assessment and Plan: Clinician recommends that Client remain in IOP treatment to better manage mental health symptoms, stabilization and to address treatment plan goals. Clinician recommends adherence to crisis/safety plan, taking medications as prescribed, and following up with medical professionals if any issues arise.   Follow Up Instructions: Clinician will send Webex link for next session. The Client was advised to call back or seek an in-person evaluation if the symptoms worsen or if the condition fails to improve as anticipated.     I provided 180 minutes of non-face-to-face time during this encounter.     Lise Auer, LCSW

## 2020-06-27 ENCOUNTER — Encounter (HOSPITAL_COMMUNITY): Payer: Self-pay | Admitting: Psychiatry

## 2020-06-27 ENCOUNTER — Other Ambulatory Visit: Payer: Self-pay

## 2020-06-27 ENCOUNTER — Other Ambulatory Visit (HOSPITAL_COMMUNITY): Payer: No Typology Code available for payment source | Admitting: Psychiatry

## 2020-06-27 DIAGNOSIS — F329 Major depressive disorder, single episode, unspecified: Secondary | ICD-10-CM | POA: Diagnosis not present

## 2020-06-27 DIAGNOSIS — F3162 Bipolar disorder, current episode mixed, moderate: Secondary | ICD-10-CM

## 2020-06-28 ENCOUNTER — Other Ambulatory Visit (HOSPITAL_COMMUNITY): Payer: No Typology Code available for payment source | Admitting: Psychiatry

## 2020-06-28 ENCOUNTER — Other Ambulatory Visit: Payer: Self-pay

## 2020-06-28 DIAGNOSIS — F3162 Bipolar disorder, current episode mixed, moderate: Secondary | ICD-10-CM

## 2020-06-28 DIAGNOSIS — F329 Major depressive disorder, single episode, unspecified: Secondary | ICD-10-CM | POA: Diagnosis not present

## 2020-06-30 ENCOUNTER — Encounter (HOSPITAL_COMMUNITY): Payer: Self-pay | Admitting: Psychiatry

## 2020-06-30 NOTE — Progress Notes (Signed)
Virtual Visit via Video Note  I connected with [name] on 06/30/20 at  9:00 AM EST by a video enabled telemedicine application and verified that I am speaking with the correct person using two identifiers.  At orientation to the IOP program, Case Manager discussed the limitations of evaluation and management by telemedicine and the availability of in person appointments. The patient expressed understanding and agreed to proceed with virtual visits throughout the duration of the program.   Location:  Patient: Patient Home Provider: Home Office   History of Present Illness: Bipolar DO  Observations/Objective: Check In: Case Manager checked in with all participants to review discharge dates, insurance authorizations, work-related documents and needs from the treatment team. Client stated needs and engaged in discussion. Case Manager introduced new group member, with group members welcoming and starting the joining process. Client presents with moderate depression and moderate anxiety. Client denied any current SI/HI/psychosis.  Initial Therapeutic Activity: Counselor introduced [name], [place] Chaplain to present information and discussion on Grief and Loss. Group members engaged in discussion, sharing how grief impacts them, what comforts them, what emotions are felt, labeling losses, etc. After guest speaker logged off, Counselor prompted group to spend 10-15 minutes journaling to process personal grief and loss situations. Counselor processed entries with group and client's identified areas for additional processing in individual therapy. Client noted that her beloved pet of 20 years passed suddenly yesterday.  Second Therapeutic Activity: Counselor introduced [name], representative with [employer] to share about programming. Group Members asked questions and engaged in discussion, as [name] shared about Peer Support, Support Groups and the [name]. Client  stated that they are interested in connecting with the Love Yourself Course.  Check Out: Counselor prompted group members to identify one self-care practice or productivity activity they would like to engage in today. Client plans to do grief work in honoring her pet who passed. Client endorsed safety plan to be followed to prevent safety issues.  Assessment and Plan: Clinician recommends that Client remain in IOP treatment to better manage mental health symptoms, stabilization and to address treatment plan goals. Clinician recommends adherence to crisis/safety plan, taking medications as prescribed, and following up with medical professionals if any issues arise.   Follow Up Instructions: Clinician will send Webex link for next session. The Client was advised to call back or seek an in-person evaluation if the symptoms worsen or if the condition fails to improve as anticipated.     I provided 180 minutes of non-face-to-face time during this encounter.     Lise Auer, LCSW

## 2020-07-01 ENCOUNTER — Encounter (HOSPITAL_COMMUNITY): Payer: Self-pay

## 2020-07-01 ENCOUNTER — Encounter (HOSPITAL_COMMUNITY): Payer: Self-pay | Admitting: Psychiatry

## 2020-07-01 ENCOUNTER — Telehealth (INDEPENDENT_AMBULATORY_CARE_PROVIDER_SITE_OTHER): Payer: No Typology Code available for payment source | Admitting: Psychiatry

## 2020-07-01 DIAGNOSIS — R4589 Other symptoms and signs involving emotional state: Secondary | ICD-10-CM | POA: Diagnosis not present

## 2020-07-01 DIAGNOSIS — F3162 Bipolar disorder, current episode mixed, moderate: Secondary | ICD-10-CM

## 2020-07-01 DIAGNOSIS — Z639 Problem related to primary support group, unspecified: Secondary | ICD-10-CM

## 2020-07-01 DIAGNOSIS — F603 Borderline personality disorder: Secondary | ICD-10-CM | POA: Diagnosis not present

## 2020-07-01 MED ORDER — BUSPIRONE HCL 7.5 MG PO TABS: 7.5000 mg | ORAL_TABLET | Freq: Every day | ORAL | 0 refills | Status: DC

## 2020-07-01 NOTE — Progress Notes (Signed)
Virtual Visit via Video Note  I connected with [name] on 07/01/20 at  9:00 AM EST by a video enabled telemedicine application and verified that I am speaking with the correct person using two identifiers.  At orientation to the IOP program, Case Manager discussed the limitations of evaluation and management by telemedicine and the availability of in person appointments. The patient expressed understanding and agreed to proceed with virtual visits throughout the duration of the program.   Location:  Patient: Patient Home Provider: Home Office   History of Present Illness: Bipolar DO  Observations/Objective: Check In: Case Manager checked in with all participants to review discharge dates, insurance authorizations, work-related documents and needs from the treatment team. Client stated needs and engaged in discussion.   Initial Therapeutic Activity: Case Manager engaged group in an ice breaker/get to know you game that was [employer] themed. Group members shared their responses and engaged in activity.  Second Therapeutic Activity: Counselor facilitated therapeutic processing with group members to assess mood and current functioning, prompting group members to share about application of skills, progress and challenges in treatment/personal lives. Client reports that she is struggling with making significant life choices. Client shared that she is including her support system and feels supported. Client states that she had improved sleep overnight. Client shared improvements in mentality and communication with others. Client presents with moderate depression and moderate anxiety. Client denied any current SI/HI/psychosis.  Check Out: Counselor prompted group members to identify one self-care practice or productivity activity they would like to engage in today. Client plans to start moving out of her childhood home. Client endorsed safety plan to be followed to prevent safety  issues.  Assessment and Plan: Clinician recommends that Client remain in IOP treatment to better manage mental health symptoms, stabilization and to address treatment plan goals. Clinician recommends adherence to crisis/safety plan, taking medications as prescribed, and following up with medical professionals if any issues arise.   Follow Up Instructions: Clinician will send Webex link for next session. The Client was advised to call back or seek an in-person evaluation if the symptoms worsen or if the condition fails to improve as anticipated.     I provided 180 minutes of non-face-to-face time during this encounter.     Lise Auer, LCSW
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
## 2020-07-02 ENCOUNTER — Other Ambulatory Visit (HOSPITAL_COMMUNITY): Payer: No Typology Code available for payment source | Admitting: Psychiatry

## 2020-07-02 ENCOUNTER — Other Ambulatory Visit: Payer: Self-pay

## 2020-07-02 ENCOUNTER — Encounter (HOSPITAL_COMMUNITY): Payer: Self-pay | Admitting: Psychiatry

## 2020-07-02 DIAGNOSIS — F332 Major depressive disorder, recurrent severe without psychotic features: Secondary | ICD-10-CM

## 2020-07-02 DIAGNOSIS — F329 Major depressive disorder, single episode, unspecified: Secondary | ICD-10-CM | POA: Diagnosis not present

## 2020-07-02 DIAGNOSIS — F603 Borderline personality disorder: Secondary | ICD-10-CM

## 2020-07-02 DIAGNOSIS — F3162 Bipolar disorder, current episode mixed, moderate: Secondary | ICD-10-CM

## 2020-07-02 NOTE — Progress Notes (Signed)
  Virtual Visit via Video Note  I connected with [name] on 07/02/20 at  9:00 AM EST by a video enabled telemedicine application and verified that I am speaking with the correct person using two identifiers.  Location: Patient: Home  Provider: Office   I discussed the limitations of evaluation and management by telemedicine and the availability of in person appointments. The patient expressed understanding and agreed to proceed.    I discussed the assessment and treatment plan with the patient. The patient was provided an opportunity to ask questions and all were answered. The patient agreed with the plan and demonstrated an understanding of the instructions.   The patient was advised to call back or seek an in-person evaluation if the symptoms worsen or if the condition fails to improve as anticipated.  I provided 15 minutes of non-face-to-face time during this encounter.   Derrill Center, NP    [facility] Intensive Outpatient Program Discharge Summary  [name] [id number]  Admission date: 06/10/2020 Discharge date: 07/02/2020  Reason for admission: Per admission assessment note- Maronda is a 40 y.o.Caucasianfemale presents after recent inpatient admission for suicidal attempt.Reported struggling with depression and multiple stressors. Reported previous inpatient admissions. Reported diagnosis with major depressive disorder, generalized anxiety disorder,bipolar and borderline personality disorder. Reported a recent break-up triggered this inpatient admission as she attempted to hang herself.   Progress in Program Toward Treatment Goals: Ongoing, [place] attended and participated with daily group session with active and engaged participation.  Patient recently completed partial hospitalization programming (PHP) and  is nearing completion with intensive outpatient programming (IOP) with plans to follow-up with outpatient psychiatrist.  States she is currently  seeking a therapist.  She continues to deny suicidal or homicidal ideations.  Denies auditory or visual hallucinations.  Reports her mood has stabilized since attending and completing both programs.  Patient will be provided with additional outpatient resources. Zan does endorse mild anxiety related to discharge and reentering the workforce.  Declined medication refills at this time.  Support, encouragement and  reassurance was provided.  Progress (rationale): Keep follow-up with De Nurse.  She reports follow-up monthly until she is able to establish care with a therapist within his office.  Case manager to provide resources for [place].  Take all medications as prescribed. Keep all follow-up appointments as scheduled.  Do not consume alcohol or use illegal drugs while on prescription medications. Report any adverse effects from your medications to your primary care provider promptly.  In the event of recurrent symptoms or worsening symptoms, call 911, a crisis hotline, or go to the nearest emergency department for evaluation.   Derrill Center, NP 07/02/2020

## 2020-07-02 NOTE — Progress Notes (Unsigned)
Virtual Visit via Video Note  I connected with [name] on 07/02/20 at  9:00 AM EST by a video enabled telemedicine application and verified that I am speaking with the correct person using two identifiers.  At orientation to the IOP program, Case Manager discussed the limitations of evaluation and management by telemedicine and the availability of in person appointments. The patient expressed understanding and agreed to proceed with virtual visits throughout the duration of the program.   Location:  Patient: Patient Home Provider: Home Office   History of Present Illness: Bipolar DO  Observations/Objective: Check In: Case Manager checked in with all participants to review discharge dates, insurance authorizations, work-related documents and needs from the treatment team. Client stated needs and engaged in discussion. Case Manager welcomed 2 new group members, with group introducing self and starting the joining process.   Initial Therapeutic Activity: Counselor facilitated therapeutic processing with group members to assess mood and current functioning, prompting group members to share about application of skills, progress and challenges in treatment/personal lives. Client reports that she is nervous about upcoming changes and returning to work. Client reports being confident in her decision making and in communication skills. Client faced challenges with recent holiday and being single. Group offered encouragement and coping strategies. Client presents with moderate depression and moderate anxiety. Client denied any current SI/HI/psychosis.  Second Therapeutic Activity: Counselor closed program by allowing time to celebrate the Client as a graduating group member. Counselor shared reflections on progress and allow space for group members to share well wishes and appreciates to the graduating client. Counselor prompted graduating client to share takeaways, reflect on progress and final  thoughts for the group. Client noted that she made much progress in the group with making changes to move forward, get "unstuck". Client encouraged openness and communication.  Check Out: Counselor prompted group members to identify one self-care practice or productivity activity they would like to engage in today. Client plans to start preparing for return to work. Client endorsed safety plan to be followed to prevent safety issues.  Assessment and Plan: Clinician recommends that Client remain in IOP treatment to better manage mental health symptoms, stabilization and to address treatment plan goals. Clinician recommends adherence to crisis/safety plan, taking medications as prescribed, and following up with medical professionals if any issues arise.   Follow Up Instructions: Clinician will send Webex link for next session. The Client was advised to call back or seek an in-person evaluation if the symptoms worsen or if the condition fails to improve as anticipated.     I provided 180 minutes of non-face-to-face time during this encounter.     Lise Auer, LCSW

## 2020-07-02 NOTE — Patient Instructions (Signed)
D:  Patient completed MH-IOP today.  A:  Discharge today.  Follow up with Dr. De Nurse and a therapist in his office.  Strongly recommend groups at [facility] [phone]).  Pt to return back to work next week.  R:  Patient receptive.
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
## 2020-07-03 ENCOUNTER — Other Ambulatory Visit: Payer: Self-pay

## 2020-07-03 ENCOUNTER — Other Ambulatory Visit (HOSPITAL_COMMUNITY): Payer: No Typology Code available for payment source

## 2020-07-04 ENCOUNTER — Ambulatory Visit (HOSPITAL_COMMUNITY): Payer: No Typology Code available for payment source

## 2020-07-08 ENCOUNTER — Other Ambulatory Visit: Payer: Self-pay | Admitting: Neurology

## 2020-07-08 DIAGNOSIS — F3162 Bipolar disorder, current episode mixed, moderate: Secondary | ICD-10-CM

## 2020-07-08 DIAGNOSIS — F331 Major depressive disorder, recurrent, moderate: Secondary | ICD-10-CM

## 2020-07-08 MED ORDER — LAMOTRIGINE 100 MG PO TABS: 100.0000 mg | ORAL_TABLET | Freq: Two times a day (BID) | ORAL | 0 refills | Status: DC

## 2020-07-08 NOTE — Addendum Note (Signed)
Addended by: Annamaria Helling on: 07/08/2020 04:53 PM   Modules accepted: Orders

## 2020-07-10 ENCOUNTER — Other Ambulatory Visit (HOSPITAL_COMMUNITY): Payer: Self-pay | Admitting: Family

## 2020-07-18 ENCOUNTER — Encounter: Payer: Self-pay | Admitting: Physician Assistant

## 2020-07-18 ENCOUNTER — Other Ambulatory Visit: Payer: Self-pay | Admitting: Physician Assistant

## 2020-07-18 DIAGNOSIS — F3162 Bipolar disorder, current episode mixed, moderate: Secondary | ICD-10-CM

## 2020-07-19 NOTE — Telephone Encounter (Signed)
Can we get her scheduled for virtual visit today?

## 2020-07-19 NOTE — Telephone Encounter (Signed)
Ok for note out of work due to medical reasons. Needs to schedule virtual Monday for follow up or with [facility].

## 2020-07-19 NOTE — Telephone Encounter (Signed)
Attempted to contact pt for scheduling and mailbox full.... could not LVM. AM

## 2020-07-19 NOTE — Telephone Encounter (Signed)
[name] for work note still tends appt.

## 2020-07-19 NOTE — Telephone Encounter (Signed)
Attempted to reach patient again to schedule appt & couldn't LVM , mailbox Full. AM

## 2020-07-19 NOTE — Telephone Encounter (Signed)
No more available appts with PCP today. Is this okay to schedule virtual with Lovena Le or wait until Monday for PCP?

## 2020-07-27 ENCOUNTER — Other Ambulatory Visit (HOSPITAL_COMMUNITY): Payer: Self-pay | Admitting: Psychiatry

## 2020-07-27 DIAGNOSIS — F331 Major depressive disorder, recurrent, moderate: Secondary | ICD-10-CM

## 2020-07-27 DIAGNOSIS — F3162 Bipolar disorder, current episode mixed, moderate: Secondary | ICD-10-CM

## 2020-07-28 ENCOUNTER — Other Ambulatory Visit (HOSPITAL_COMMUNITY): Payer: Self-pay | Admitting: Psychiatry

## 2020-08-06 ENCOUNTER — Telehealth (HOSPITAL_COMMUNITY): Payer: No Typology Code available for payment source | Admitting: Psychiatry

## 2020-08-08 ENCOUNTER — Telehealth (HOSPITAL_COMMUNITY): Payer: No Typology Code available for payment source | Admitting: Psychiatry

## 2020-08-25 ENCOUNTER — Other Ambulatory Visit (HOSPITAL_COMMUNITY): Payer: Self-pay | Admitting: Psychiatry

## 2020-09-18 ENCOUNTER — Encounter: Payer: Self-pay | Admitting: Physician Assistant

## 2020-09-18 ENCOUNTER — Telehealth (INDEPENDENT_AMBULATORY_CARE_PROVIDER_SITE_OTHER): Payer: No Typology Code available for payment source | Admitting: Physician Assistant

## 2020-09-18 VITALS — Temp 98.7°F | Ht 64.0 in | Wt 168.0 lb

## 2020-09-18 DIAGNOSIS — F3162 Bipolar disorder, current episode mixed, moderate: Secondary | ICD-10-CM | POA: Diagnosis not present

## 2020-09-18 DIAGNOSIS — F603 Borderline personality disorder: Secondary | ICD-10-CM | POA: Diagnosis not present

## 2020-09-18 DIAGNOSIS — F99 Mental disorder, not otherwise specified: Secondary | ICD-10-CM

## 2020-09-18 DIAGNOSIS — F411 Generalized anxiety disorder: Secondary | ICD-10-CM | POA: Diagnosis not present

## 2020-09-18 DIAGNOSIS — F5105 Insomnia due to other mental disorder: Secondary | ICD-10-CM

## 2020-09-18 MED ORDER — TRAZODONE HCL 50 MG PO TABS: 50.0000 mg | ORAL_TABLET | Freq: Every day | ORAL | 1 refills | Status: DC

## 2020-09-18 MED ORDER — CARIPRAZINE HCL 1.5 MG PO CAPS: 1.5000 mg | ORAL_CAPSULE | ORAL | 1 refills | Status: DC
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
## 2020-09-18 NOTE — Patient Instructions (Addendum)
Taper off seroquel 2 tablets for 2 weeks at night, 1 tablet for 2 weeks at night then stop.  Start vralyar 1.5mg  daily.  Will make referral for new psychiatry.  Follow up in 4 weeks.

## 2020-09-18 NOTE — Progress Notes (Signed)
Seeing - Dr. De Nurse, wants to change providers  PHQ9 (20) - GAD7 (21) completed.

## 2020-09-20 ENCOUNTER — Telehealth: Payer: Self-pay | Admitting: Neurology

## 2020-09-20 DIAGNOSIS — F99 Mental disorder, not otherwise specified: Secondary | ICD-10-CM | POA: Insufficient documentation

## 2020-09-20 DIAGNOSIS — F5105 Insomnia due to other mental disorder: Secondary | ICD-10-CM | POA: Insufficient documentation

## 2020-09-20 NOTE — Telephone Encounter (Signed)
Need to call patient to see if she could like to come by and get coupon card for Vraylar.

## 2020-09-20 NOTE — Telephone Encounter (Signed)
Spoke with patient, coupon card at the front for pick up.

## 2020-10-01 ENCOUNTER — Other Ambulatory Visit: Payer: Self-pay | Admitting: Physician Assistant

## 2020-10-01 DIAGNOSIS — F603 Borderline personality disorder: Secondary | ICD-10-CM

## 2020-10-01 DIAGNOSIS — F3162 Bipolar disorder, current episode mixed, moderate: Secondary | ICD-10-CM

## 2020-10-01 DIAGNOSIS — F411 Generalized anxiety disorder: Secondary | ICD-10-CM

## 2020-10-01 DIAGNOSIS — Z639 Problem related to primary support group, unspecified: Secondary | ICD-10-CM

## 2020-10-01 DIAGNOSIS — F331 Major depressive disorder, recurrent, moderate: Secondary | ICD-10-CM

## 2020-10-23 ENCOUNTER — Encounter: Payer: Self-pay | Admitting: Physician Assistant

## 2020-10-24 ENCOUNTER — Other Ambulatory Visit: Payer: Self-pay | Admitting: Physician Assistant

## 2020-10-24 DIAGNOSIS — F3162 Bipolar disorder, current episode mixed, moderate: Secondary | ICD-10-CM

## 2020-10-24 DIAGNOSIS — F331 Major depressive disorder, recurrent, moderate: Secondary | ICD-10-CM

## 2020-10-24 NOTE — Telephone Encounter (Signed)
Unclear whether patient should still be on this? Looks like she was transitioning to [employer].

## 2020-10-25 ENCOUNTER — Other Ambulatory Visit: Payer: Self-pay | Admitting: Family Medicine

## 2020-10-25 DIAGNOSIS — F3162 Bipolar disorder, current episode mixed, moderate: Secondary | ICD-10-CM

## 2020-10-25 DIAGNOSIS — F331 Major depressive disorder, recurrent, moderate: Secondary | ICD-10-CM

## 2020-10-25 NOTE — Telephone Encounter (Signed)
Med filled for 60 days so that she does not run out.  But there is a potential for increase cardiac arrhythmia in combination but she is also not on a high dose of the Lamictal so I will leave that up to [facility] when she comes back but that should at least cover her for the next couple of weeks.

## 2020-10-28 ENCOUNTER — Other Ambulatory Visit: Payer: Self-pay | Admitting: Physician Assistant

## 2020-10-28 NOTE — Telephone Encounter (Signed)
She does not want to stay with him. What about referral to mood treatment center? She also prefers a female.

## 2020-10-29 NOTE — Telephone Encounter (Signed)
Referral was updated to another location and resent yesterday to new location she will get a call in a few days. I also called patient to let her know and gave her the website to go to fill out her new patient forms. I did tell her if she did not hear from them by Thursday Afternoon to call me so I could check on the referral Friday morning before I left - CF

## 2020-11-19 ENCOUNTER — Encounter: Payer: No Typology Code available for payment source | Admitting: Physician Assistant

## 2020-11-25 ENCOUNTER — Encounter: Payer: No Typology Code available for payment source | Admitting: Physician Assistant

## 2020-11-25 ENCOUNTER — Telehealth (INDEPENDENT_AMBULATORY_CARE_PROVIDER_SITE_OTHER): Payer: No Typology Code available for payment source | Admitting: Physician Assistant

## 2020-11-25 DIAGNOSIS — F411 Generalized anxiety disorder: Secondary | ICD-10-CM

## 2020-11-25 DIAGNOSIS — F331 Major depressive disorder, recurrent, moderate: Secondary | ICD-10-CM

## 2020-11-25 DIAGNOSIS — F3162 Bipolar disorder, current episode mixed, moderate: Secondary | ICD-10-CM | POA: Diagnosis not present

## 2020-11-25 DIAGNOSIS — F603 Borderline personality disorder: Secondary | ICD-10-CM | POA: Diagnosis not present

## 2020-11-25 DIAGNOSIS — F332 Major depressive disorder, recurrent severe without psychotic features: Secondary | ICD-10-CM

## 2020-11-25 DIAGNOSIS — Z639 Problem related to primary support group, unspecified: Secondary | ICD-10-CM

## 2020-11-25 MED ORDER — FLUOXETINE HCL 40 MG PO CAPS: 40.0000 mg | ORAL_CAPSULE | Freq: Every day | ORAL | 1 refills | Status: DC

## 2020-11-25 MED ORDER — LAMOTRIGINE 100 MG PO TABS: ORAL_TABLET | ORAL | 1 refills | Status: DC

## 2020-11-25 MED ORDER — CARIPRAZINE HCL 3 MG PO CAPS: 3.0000 mg | ORAL_CAPSULE | Freq: Every day | ORAL | 2 refills | Status: DC

## 2020-11-25 MED ORDER — BUSPIRONE HCL 7.5 MG PO TABS: 7.5000 mg | ORAL_TABLET | Freq: Every day | ORAL | 1 refills | Status: DC

## 2020-11-25 NOTE — Progress Notes (Signed)
Patient ID: [name], female   DOB: 05-21-1980, 40 y.o.   MRN: [id number] .[name] KitchenVirtual Visit via Video Note  I connected with [name] on 11/26/20 at  2:00 PM EDT by a video enabled telemedicine application and verified that I am speaking with the correct person using two identifiers.  Location: Patient: home Provider: clinic  .[name] KitchenParticipating in visit:  Patient: [name] Provider: Iran Planas PA-C   I discussed the limitations of evaluation and management by telemedicine and the availability of in person appointments. The patient expressed understanding and agreed to proceed.  History of Present Illness: Patient is a 40 year old female with bipolar 1, borderline personality disorder, anxiety, depression who presents to the clinic for medication refills. She is doing so much better.  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  She is very happy about this job change.  She has decided to move in permanently with her parents and her daughter.  She feels that this will be the best for her mental health.  She feels really controlled on her current medications.  She denies any suicidal thoughts or homicidal idealizations.  She feels like she is able to make good decisions.  She still struggles with some anxiety that she would like to see improvement from.  She is in the middle of a separation, divorce, custody battle.  She thinks once this is more settled her mood will be better.  She has had great improvement with Vraylar.  .. Active Ambulatory Problems    Diagnosis Date Noted   Left breast lump 03/05/2015   Borderline personality disorder ([place]) 07/09/2015   Generalized anxiety disorder 07/09/2015   Bipolar 1 disorder, mixed, moderate (H[facility]) 07/09/2015   Iron deficiency anemia 03/20/2016   Encounter for insertion of intrauterine contraceptive device (IUD) 08/05/2016   Postpartum depression 09/15/2016   Nevus 03/15/2017   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   Worsening headaches 10/13/2019   Paresthesia 10/13/2019   Other symptoms and signs involving the nervous system 10/13/2019   Relationship problems 01/31/2020   Bipolar 1 disorder ([place]) 04/22/2020   MDD (major depressive disorder), recurrent episode, severe ([place]) 04/22/2020   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/2020   Moderate episode of recurrent major depressive disorder ([place]) 11/26/2020   Resolved Ambulatory Problems    Diagnosis Date Noted   Nipple discharge 03/05/2015   Depression 07/09/2015   [redacted] weeks gestation of pregnancy 03/20/2016   Normal pregnancy 06/03/2016   Class 1 obesity due to excess calories without serious comorbidity with body mass index (BMI) of 32.0 to 32.9 in adult 04/20/2018   Upper extremity weakness 10/13/2019   Past Medical History:  Diagnosis Date   Headache    History of bipolar disorder    History of pancreatitis    Hx of varicella    Migraines    Vaginal Pap smear, abnormal    Reviewed med, allergy, problem list.    Observations/Objective: No acute distress Normal mood and appearance  .[name] Depression screen [facility] 2/9 11/26/2020 09/18/2020 06/24/2020 04/16/2020 01/31/2020  Decreased Interest 0 3 1 3 1   Down, Depressed, Hopeless 1 1 1 3 3   PHQ - 2 Score 1 4 2 6 4   Altered sleeping 1 3 0 1 1  Tired, decreased energy 1 3 2 3 1   Change in appetite 1 0 1 1 1   Feeling bad or failure about yourself  1 3 1 3 3   Trouble  concentrating 0 3 1 1 3   Moving slowly or fidgety/restless 0 3 2 2 3   Suicidal thoughts 0 1 1 3  0  PHQ-9 Score 5 20 10 20 16   Difficult doing work/chores Somewhat difficult Somewhat difficult Somewhat difficult Very difficult Somewhat difficult  Some encounter information is confidential and restricted. Go to Review Flowsheets activity to see all data.  Some recent data might be hidden   .[name] GAD 7 : Generalized Anxiety Score 11/26/2020 09/18/2020 06/24/2020 04/16/2020  Nervous, Anxious, on Edge 1 3 3 3    Control/stop worrying 0 3 3 3   Worry too much - different things 1 3 3 3   Trouble relaxing 0 3 2 3   Restless 1 3 3 3   Easily annoyed or irritable 1 3 2 3   Afraid - awful might happen 1 3 2 3   Total GAD 7 Score 5 21 18 21   Anxiety Difficulty Somewhat difficult Somewhat difficult Somewhat difficult Extremely difficult     Assessment and Plan: [name] KitchenMarland KitchenSierra was seen today for follow-up.  Diagnoses and all orders for this visit:  Bipolar 1 disorder, mixed, moderate (HCC) -     cariprazine (VRAYLAR) 3 MG capsule; Take 1 capsule (3 mg total) by mouth daily. -     lamoTRIgine (LAMICTAL) 100 MG tablet; TAKE 1 TABLET (100 MG TOTAL) BY MOUTH 2 (TWO) TIMES DAILY. FOR MOOD STABILIZATION -     FLUoxetine (PROZAC) 40 MG capsule; Take 1 capsule (40 mg total) by mouth daily.  Moderate episode of recurrent major depressive disorder (HCC) -     lamoTRIgine (LAMICTAL) 100 MG tablet; TAKE 1 TABLET (100 MG TOTAL) BY MOUTH 2 (TWO) TIMES DAILY. FOR MOOD STABILIZATION -     FLUoxetine (PROZAC) 40 MG capsule; Take 1 capsule (40 mg total) by mouth daily.  Borderline personality disorder (HCC) -     lamoTRIgine (LAMICTAL) 100 MG tablet; TAKE 1 TABLET (100 MG TOTAL) BY MOUTH 2 (TWO) TIMES DAILY. FOR MOOD STABILIZATION -     FLUoxetine (PROZAC) 40 MG capsule; Take 1 capsule (40 mg total) by mouth daily.  Generalized anxiety disorder -     lamoTRIgine (LAMICTAL) 100 MG tablet; TAKE 1 TABLET (100 MG TOTAL) BY MOUTH 2 (TWO) TIMES DAILY. FOR MOOD STABILIZATION -     FLUoxetine (PROZAC) 40 MG capsule; Take 1 capsule (40 mg total) by mouth daily. -     busPIRone (BUSPAR) 7.5 MG tablet; Take 1 tablet (7.5 mg total) by mouth daily.  Relationship problems -     FLUoxetine (PROZAC) 40 MG capsule; Take 1 capsule (40 mg total) by mouth daily.  Pt is doing MUCH better. See PHQ/GAD. She has a new job and taking care of her daughter well. She has some room for improvement.  Increased vraylar to 3mg .  Continue prozac,  lamictal, buspar.  Continue talkspace counseling.  Follow up in 3 months.     Follow Up Instructions:    I discussed the assessment and treatment plan with the patient. The patient was provided an opportunity to ask questions and all were answered. The patient agreed with the plan and demonstrated an understanding of the instructions.   The patient was advised to call back or seek an in-person evaluation if the symptoms worsen or if the condition fails to improve as anticipated.    Stacy Planas, PA-C

## 2020-11-26 ENCOUNTER — Encounter: Payer: Self-pay | Admitting: Physician Assistant

## 2020-11-26 DIAGNOSIS — F331 Major depressive disorder, recurrent, moderate: Secondary | ICD-10-CM | POA: Insufficient documentation

## 2020-11-28 ENCOUNTER — Other Ambulatory Visit: Payer: Self-pay | Admitting: Physician Assistant

## 2020-11-28 DIAGNOSIS — F3162 Bipolar disorder, current episode mixed, moderate: Secondary | ICD-10-CM

## 2021-02-03 ENCOUNTER — Telehealth: Payer: Self-pay

## 2021-02-03 NOTE — Telephone Encounter (Signed)
Medication: cariprazine (VRAYLAR) 3 MG capsule  Prior authorization determination received Medication has been approved Approval dates: 02/03/2021-02/03/2023  Patient aware via: [place] aware: Yes Provider aware via this encounter

## 2021-02-15 IMAGING — US US CAROTID DUPLEX BILAT
1 series · 13 of 24 positions shown · non-contrast
Comparison: None.

CLINICAL DATA: Paresthesias, headaches upper extremity weakness

EXAM:
BILATERAL CAROTID DUPLEX ULTRASOUND
TECHNIQUE: Gray scale imaging, color Doppler and duplex ultrasound were
performed of bilateral carotid and vertebral arteries in the neck.

[Series 1: us carotid duplex bilat · 0.05mm/px · 13 of 66 slices shown]
[im 1/66]
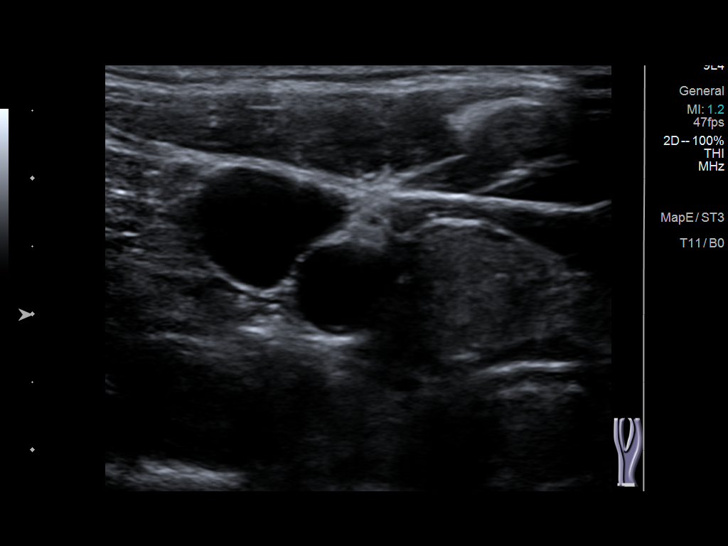
[im 6/66]
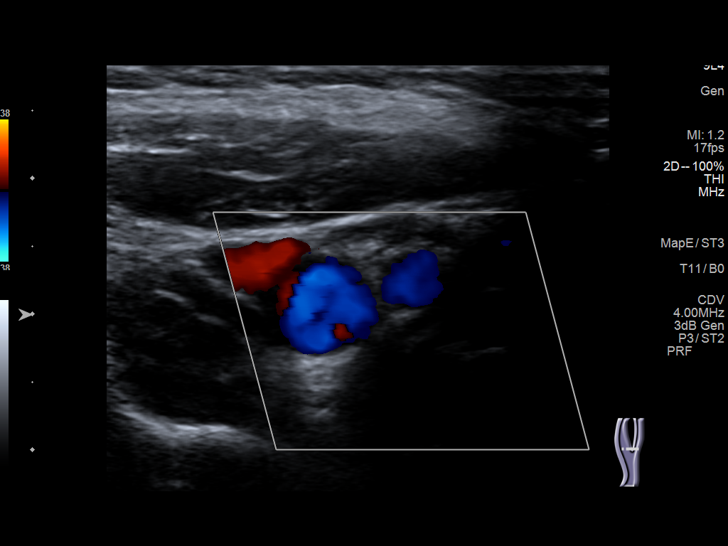
[im 12/66]
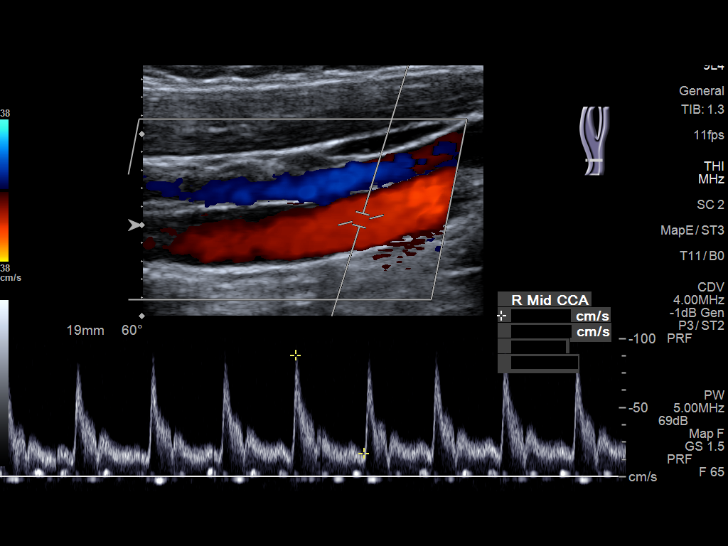
[im 17/66]
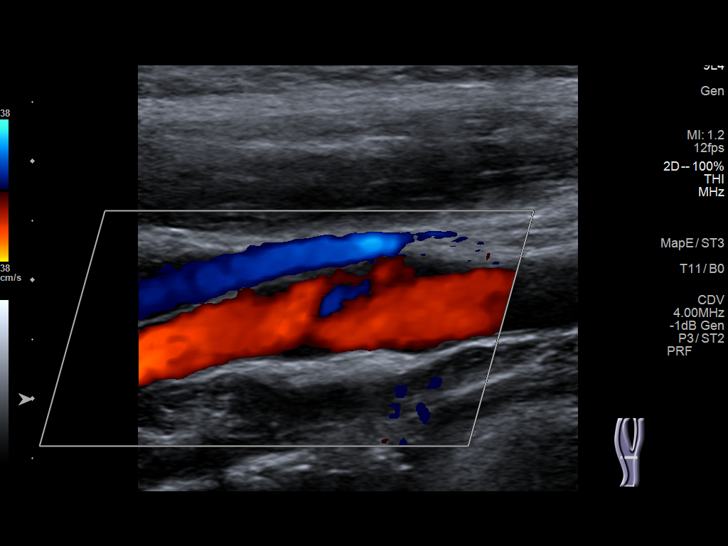
[im 23/66]
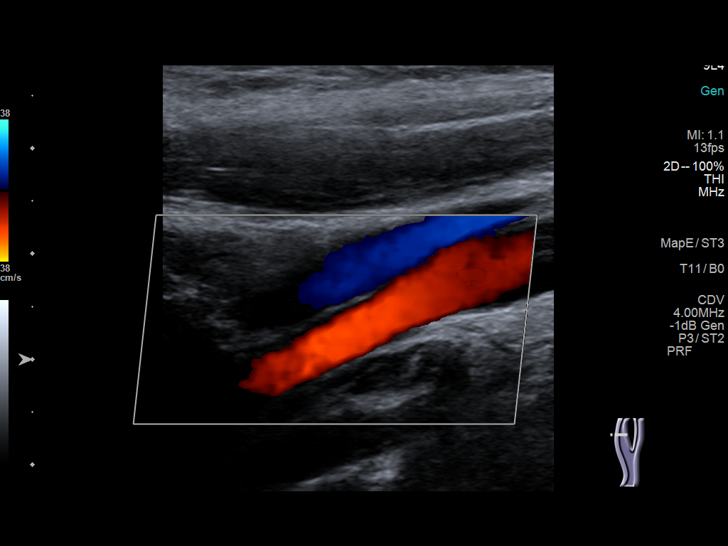
[im 29/66]
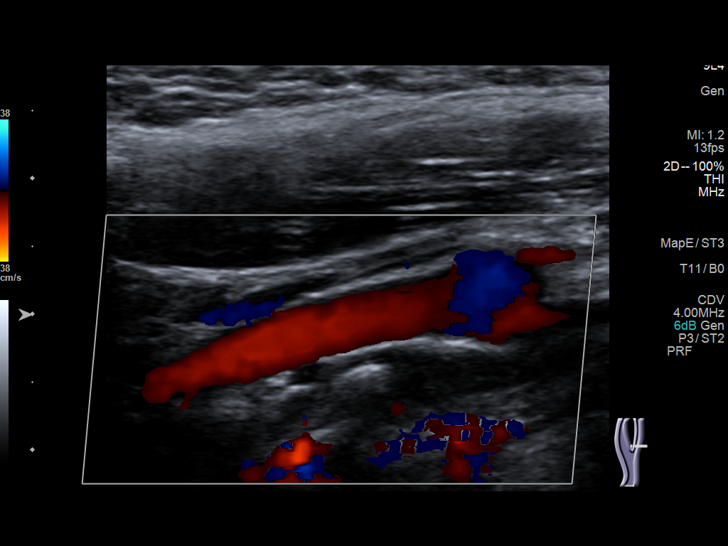
[im 34/66]
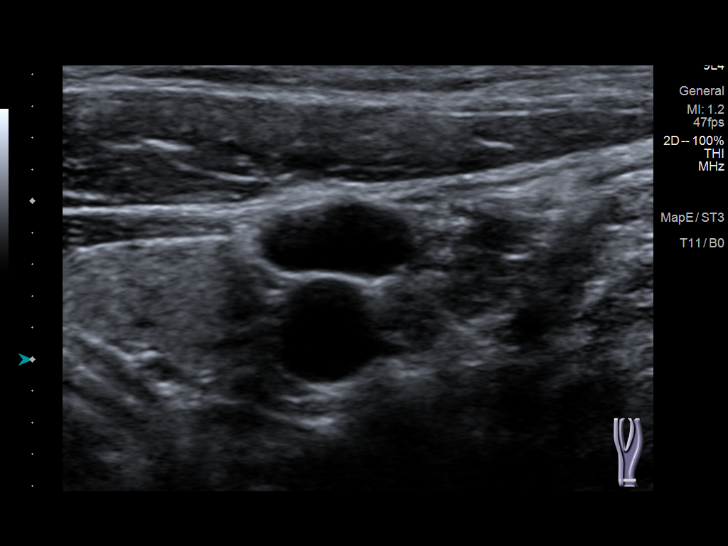
[im 37/66]
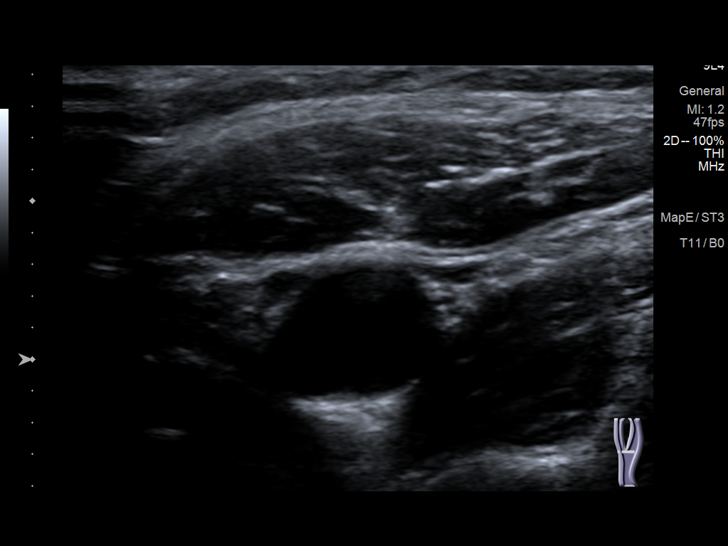
[im 43/66]
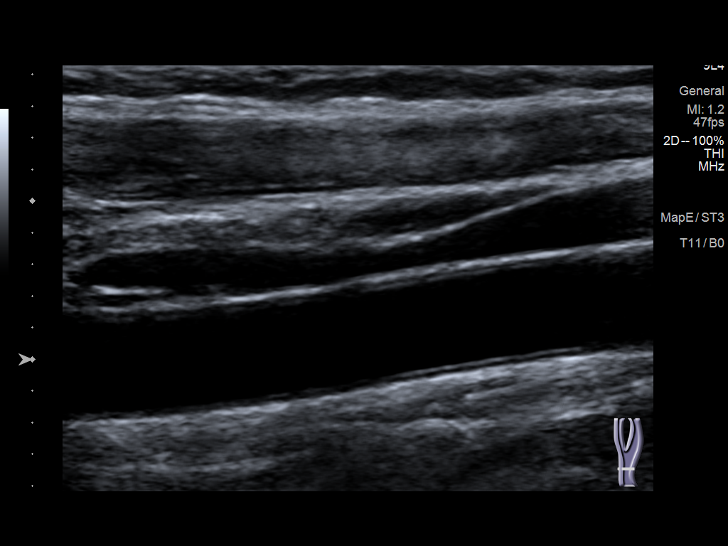
[im 49/66]
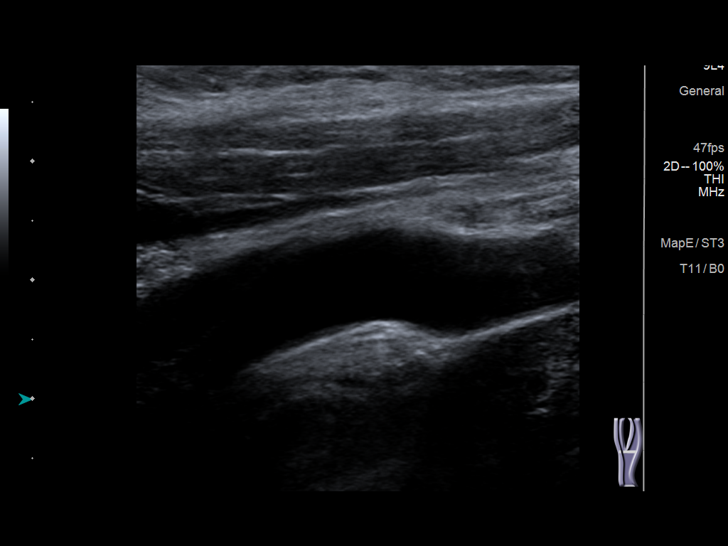
[im 54/66]
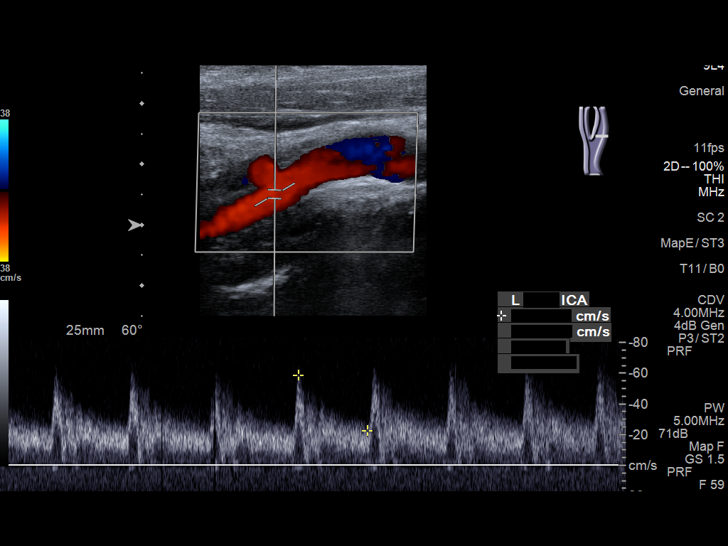
[im 60/66]
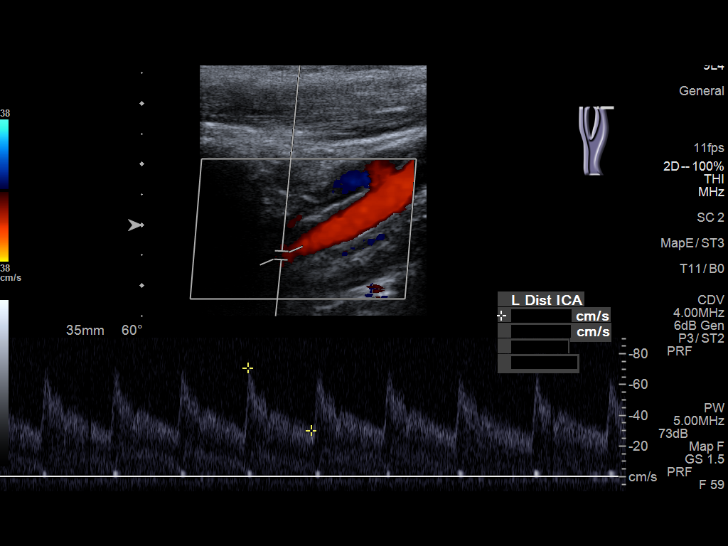
[im 66/66]
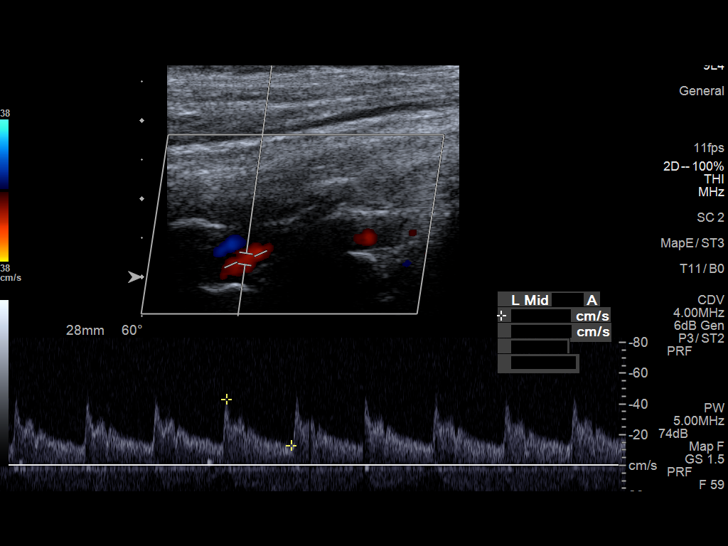

[13 of 24 positions shown; findings below may reference images not displayed]

FINDINGS: Criteria: Quantification of carotid stenosis is based on velocity
parameters that correlate the residual internal carotid diameter
with NASCET-based stenosis levels, using the diameter of the distal
internal carotid lumen as the denominator for stenosis measurement.

The following velocity measurements were obtained:

RIGHT

ICA: 72/29 cm/sec

CCA: 88/17 cm/sec

SYSTOLIC ICA/CCA RATIO:

ECA: 99 cm/sec

LEFT

ICA: 75/31 cm/sec

CCA: 98/23 cm/sec

SYSTOLIC ICA/CCA RATIO:

ECA: 109 cm/sec

RIGHT CAROTID ARTERY: No significant plaque formation or
atherosclerosis. No hemodynamically significant ICA stenosis,
velocity elevation, or turbulent flow. Normal appearance for age.

RIGHT VERTEBRAL ARTERY:  Normal antegrade flow

LEFT CAROTID ARTERY: Negative for atherosclerosis. No stenosis,
velocity elevation, turbulent flow. Normal for age.

LEFT VERTEBRAL ARTERY:  Normal antegrade flow

Upper extremity blood pressures: RIGHT: 106/68 LEFT: 104/72
IMPRESSION: Normal carotid ultrasound.  Negative for stenosis.

Normal antegrade vertebral flow bilaterally

## 2021-04-09 ENCOUNTER — Telehealth (INDEPENDENT_AMBULATORY_CARE_PROVIDER_SITE_OTHER): Payer: BLUE CROSS/BLUE SHIELD | Admitting: Physician Assistant

## 2021-04-09 ENCOUNTER — Encounter: Payer: Self-pay | Admitting: Physician Assistant

## 2021-04-09 DIAGNOSIS — F603 Borderline personality disorder: Secondary | ICD-10-CM | POA: Diagnosis not present

## 2021-04-09 DIAGNOSIS — F331 Major depressive disorder, recurrent, moderate: Secondary | ICD-10-CM | POA: Diagnosis not present

## 2021-04-09 DIAGNOSIS — Z639 Problem related to primary support group, unspecified: Secondary | ICD-10-CM

## 2021-04-09 DIAGNOSIS — G43109 Migraine with aura, not intractable, without status migrainosus: Secondary | ICD-10-CM

## 2021-04-09 DIAGNOSIS — F3162 Bipolar disorder, current episode mixed, moderate: Secondary | ICD-10-CM | POA: Diagnosis not present

## 2021-04-09 DIAGNOSIS — F411 Generalized anxiety disorder: Secondary | ICD-10-CM | POA: Diagnosis not present

## 2021-04-09 MED ORDER — BUSPIRONE HCL 7.5 MG PO TABS: 7.5000 mg | ORAL_TABLET | Freq: Every day | ORAL | 1 refills | Status: DC

## 2021-04-09 MED ORDER — CARIPRAZINE HCL 3 MG PO CAPS
3.0000 mg | ORAL_CAPSULE | Freq: Every day | ORAL | 1 refills | Status: DC
Start: 2021-04-09 — End: 2021-09-05

## 2021-04-09 MED ORDER — NURTEC 75 MG PO TBDP: 1.0000 | ORAL_TABLET | ORAL | 5 refills | Status: DC

## 2021-04-09 MED ORDER — FLUOXETINE HCL 40 MG PO CAPS: 40.0000 mg | ORAL_CAPSULE | Freq: Every day | ORAL | 1 refills | Status: DC

## 2021-04-09 MED ORDER — LAMOTRIGINE 100 MG PO TABS: ORAL_TABLET | ORAL | 1 refills | Status: DC

## 2021-04-09 NOTE — Progress Notes (Signed)
..Virtual Visit via Video Note  I connected with [name] on 04/14/21 at  8:30 AM EST by a video enabled telemedicine application and verified that I am speaking with the correct person using two identifiers.  Location: Patient: home Provider: clinic  .[name] KitchenParticipating in visit:  Patient: [name] Provider: Iran Planas PA-C Provider in training: Haroldine Laws PA-S    I discussed the limitations of evaluation and management by telemedicine and the availability of in person appointments. The patient expressed understanding and agreed to proceed.  History of Present Illness: Patient is a 40 year old female with bipolar 1, borderline personality disorder, generalized anxiety disorder, insomnia, migraines who calls into the clinic for medication refills.  Patient is currently living with her parents and her 19-year-old daughter.  She is doing okay.  She does like living with her parents.  She likes this added support.  She is in a custody battle for more time with her daughter.  This has added a lot of stress.  She did start a new job which she does enjoy.  She feels like this is been a good change for her.  She has not been able to continue to afford counseling.  She does wish she could do this more regularly.  She has been very compliant with her medications.  She denies any suicidal thoughts or homicidal idealizations.  She has been having more panic attacks and trouble sleeping. She has been having 3 migraines a week. She wants to get back on preventative.    .. Active Ambulatory Problems    Diagnosis Date Noted   Left breast lump 03/05/2015   Borderline personality disorder ([place]) 07/09/2015   Generalized anxiety disorder 07/09/2015   Bipolar 1 disorder, mixed, moderate (H[facility]) 07/09/2015   Iron deficiency anemia 03/20/2016   Encounter for insertion of intrauterine contraceptive device (IUD) 08/05/2016   Postpartum depression 09/15/2016   Nevus 03/15/2017   Ocular migraine 10/04/2017    Worsening headaches 10/13/2019   Paresthesia 10/13/2019   Other symptoms and signs involving the nervous system 10/13/2019   Relationship problems 01/31/2020   Bipolar 1 disorder ([place]) 04/22/2020   MDD (major depressive disorder), recurrent episode, severe ([place]) 04/22/2020   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/2020   Moderate episode of recurrent major depressive disorder ([place]) 11/26/2020   Resolved Ambulatory Problems    Diagnosis Date Noted   Nipple discharge 03/05/2015   Depression 07/09/2015   [redacted] weeks gestation of pregnancy 03/20/2016   Normal pregnancy 06/03/2016   Class 1 obesity due to excess calories without serious comorbidity with body mass index (BMI) of 32.0 to 32.9 in adult 04/20/2018   Upper extremity weakness 10/13/2019   Past Medical History:  Diagnosis Date   Headache    History of bipolar disorder    History of pancreatitis    Hx of varicella    Migraines    Vaginal Pap smear, abnormal       Observations/Objective: No acute distress Normal breathing Normal appearance Normal behavior and judgement  .[name] Depression screen [facility] 2/9 04/09/2021 11/26/2020 09/18/2020 06/24/2020 04/16/2020  Decreased Interest 1 0 3 1 3   Down, Depressed, Hopeless 3 1 1 1 3   PHQ - 2 Score 4 1 4 2 6   Altered sleeping 2 1 3  0 1  Tired, decreased energy 3 1 3 2 3   Change in appetite 3 1 0 1 1  Feeling bad or failure about yourself  3 1 3 1 3   Trouble concentrating 1 0 3 1  1  Moving slowly or fidgety/restless 1 0 3 2 2   Suicidal thoughts 0 0 1 1 3   PHQ-9 Score 17 5 20 10 20   Difficult doing work/chores Very difficult Somewhat difficult Somewhat difficult Somewhat difficult Very difficult  Some encounter information is confidential and restricted. Go to Review Flowsheets activity to see all data.  Some recent data might be hidden   .[name] GAD 7 : Generalized Anxiety Score 04/09/2021 11/26/2020 09/18/2020 06/24/2020  Nervous, Anxious, on Edge 3 1 3 3   Control/stop worrying 3 0 3 3   Worry too much - different things 3 1 3 3   Trouble relaxing 3 0 3 2  Restless 3 1 3 3   Easily annoyed or irritable 3 1 3 2   Afraid - awful might happen 3 1 3 2   Total GAD 7 Score 21 5 21 18   Anxiety Difficulty Very difficult Somewhat difficult Somewhat difficult Somewhat difficult       Assessment and Plan: [name] KitchenMarland KitchenCarmell was seen today for depression and anxiety.  Diagnoses and all orders for this visit:  Bipolar 1 disorder, mixed, moderate (HCC) -     lamoTRIgine (LAMICTAL) 100 MG tablet; TAKE 1 TABLET (100 MG TOTAL) BY MOUTH 2 (TWO) TIMES DAILY. FOR MOOD STABILIZATION -     FLUoxetine (PROZAC) 40 MG capsule; Take 1 capsule (40 mg total) by mouth daily. -     cariprazine (VRAYLAR) 3 MG capsule; Take 1 capsule (3 mg total) by mouth daily.  Moderate episode of recurrent major depressive disorder (HCC) -     lamoTRIgine (LAMICTAL) 100 MG tablet; TAKE 1 TABLET (100 MG TOTAL) BY MOUTH 2 (TWO) TIMES DAILY. FOR MOOD STABILIZATION -     FLUoxetine (PROZAC) 40 MG capsule; Take 1 capsule (40 mg total) by mouth daily.  Borderline personality disorder (HCC) -     lamoTRIgine (LAMICTAL) 100 MG tablet; TAKE 1 TABLET (100 MG TOTAL) BY MOUTH 2 (TWO) TIMES DAILY. FOR MOOD STABILIZATION -     FLUoxetine (PROZAC) 40 MG capsule; Take 1 capsule (40 mg total) by mouth daily.  Generalized anxiety disorder -     lamoTRIgine (LAMICTAL) 100 MG tablet; TAKE 1 TABLET (100 MG TOTAL) BY MOUTH 2 (TWO) TIMES DAILY. FOR MOOD STABILIZATION -     FLUoxetine (PROZAC) 40 MG capsule; Take 1 capsule (40 mg total) by mouth daily. -     busPIRone (BUSPAR) 7.5 MG tablet; Take 1 tablet (7.5 mg total) by mouth daily.  Relationship problems -     FLUoxetine (PROZAC) 40 MG capsule; Take 1 capsule (40 mg total) by mouth daily.  Ocular migraine -     Rimegepant Sulfate (NURTEC) 75 MG TBDP; Take 1 tablet by mouth every other day.  Migraines: nurtec for prevention every other day.   Mood: refilled medications. Work on work  balance. Strongly consider regular exercise/meditation.   Follow Up Instructions:    I discussed the assessment and treatment plan with the patient. The patient was provided an opportunity to ask questions and all were answered. The patient agreed with the plan and demonstrated an understanding of the instructions.   The patient was advised to call back or seek an in-person evaluation if the symptoms worsen or if the condition fails to improve as anticipated.    Iran Planas, PA-C

## 2021-04-15 ENCOUNTER — Telehealth: Payer: Self-pay

## 2021-04-15 ENCOUNTER — Ambulatory Visit (INDEPENDENT_AMBULATORY_CARE_PROVIDER_SITE_OTHER): Payer: BLUE CROSS/BLUE SHIELD | Admitting: Physician Assistant

## 2021-04-15 ENCOUNTER — Encounter: Payer: Self-pay | Admitting: Physician Assistant

## 2021-04-15 ENCOUNTER — Other Ambulatory Visit: Payer: Self-pay

## 2021-04-15 VITALS — BP 104/68 | HR 70 | Temp 97.9°F | Ht 64.0 in | Wt 176.0 lb

## 2021-04-15 DIAGNOSIS — R112 Nausea with vomiting, unspecified: Secondary | ICD-10-CM

## 2021-04-15 LAB — POCT INFLUENZA A/B
Influenza A, POC: NEGATIVE
Influenza B, POC: NEGATIVE

## 2021-04-15 MED ORDER — ONDANSETRON 8 MG PO TBDP: 8.0000 mg | ORAL_TABLET | Freq: Three times a day (TID) | ORAL | 3 refills | Status: DC | PRN

## 2021-04-15 NOTE — Telephone Encounter (Signed)
Patient called stating she has been nauseous and vomiting for the past two days. She does not recall eating anything new or out of the odrinary. She has also had a sore throat. She been unable to keep food and liquids down. She took two at home COVID-19 test. One yesterday and one this morning, both were negative. Would you like for me to add her to the schedule this afternoon?

## 2021-04-15 NOTE — Telephone Encounter (Signed)
Patient scheduled.

## 2021-04-15 NOTE — Progress Notes (Signed)
Subjective:    Patient ID: [name], female    DOB: 01/15/1981, 40 y.o.   MRN: [id number]  HPI 40 y.o female presenting to the clinic after two days of nausea and vomiting. Pt reports vomiting every 1-2 hours and states it is clear to bilious in nature. States she is unable to keep solids or liquids down. Reports no recent dietary changes but does state that her daughter was sick with a viral illness two weeks ago and had a bout of vomiting. Reports a migraine on today's visit. Denies hematemesis, melena, hematochezia, cough, abdominal pain/cramping, fever, chills, body aches, diarrhea, constipation, or urinary changes.   .. Active Ambulatory Problems    Diagnosis Date Noted   Left breast lump 03/05/2015   Borderline personality disorder ([place]) 07/09/2015   Generalized anxiety disorder 07/09/2015   Bipolar 1 disorder, mixed, moderate (HCC) 07/09/2015   Iron deficiency anemia 03/20/2016   Encounter for insertion of intrauterine contraceptive device (IUD) 08/05/2016   Postpartum depression 09/15/2016   Nevus 03/15/2017   Ocular migraine 10/04/2017   Worsening headaches 10/13/2019   Paresthesia 10/13/2019   Other symptoms and signs involving the nervous system 10/13/2019   Relationship problems 01/31/2020   Bipolar 1 disorder ([place]) 04/22/2020   MDD (major depressive disorder), recurrent episode, severe ([place]) 04/22/2020   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/2020   Moderate episode of recurrent major depressive disorder ([place]) 11/26/2020   Resolved Ambulatory Problems    Diagnosis Date Noted   Nipple discharge 03/05/2015   Depression 07/09/2015   [redacted] weeks gestation of pregnancy 03/20/2016   Normal pregnancy 06/03/2016   Class 1 obesity due to excess calories without serious comorbidity with body mass index (BMI) of 32.0 to 32.9 in adult 04/20/2018   Upper extremity weakness 10/13/2019   Past Medical History:  Diagnosis Date   Headache    History of bipolar disorder     History of pancreatitis    Hx of varicella    Migraines    Vaginal Pap smear, abnormal       Review of Systems  Constitutional:  Negative for chills, fatigue and fever.  Gastrointestinal:  Positive for nausea and vomiting. Negative for abdominal distention, abdominal pain, blood in stool, constipation and diarrhea.  Genitourinary:  Negative for difficulty urinating, dysuria, flank pain and urgency.  Neurological:  Positive for headaches.      Objective:   Physical Exam Constitutional:      Appearance: She is not diaphoretic.  HENT:     Nose: No congestion or rhinorrhea.  Eyes:     General: No scleral icterus. Cardiovascular:     Rate and Rhythm: Normal rate and regular rhythm.     Pulses: Normal pulses.     Heart sounds: Normal heart sounds.  Abdominal:     General: There is no distension.     Palpations: There is no mass.     Tenderness: There is abdominal tenderness. There is no right CVA tenderness, left CVA tenderness, guarding or rebound.  Skin:    Coloration: Skin is not jaundiced.  Neurological:     Mental Status: She is oriented to person, place, and time.   .. Results for orders placed or performed in visit on 04/15/21  POCT Influenza A/B  Result Value Ref Range   Influenza A, POC Negative Negative   Influenza B, POC Negative Negative        Assessment & Plan:  Diagnoses and all orders for this visit: Nausea and  vomiting, unspecified vomiting type -     ondansetron (ZOFRAN-ODT) 8 MG disintegrating tablet; Take 1 tablet (8 mg total) by mouth every 8 (eight) hours as needed for nausea. -     COMPLETE METABOLIC PANEL WITH GFR -     CBC w/Diff/Platelet -     Lipase -     H. pylori breath test -     POCT Influenza A/B    Unclear etiology.  Vitals are reassuring Neg flu and home covid test No medication changes Likely viral gastroenteritis however cannot rule out gastritis vs cholecystitis Will get labs to evaluate Pt is diffusely tender throughout  the abdomen Zofran given in office and helped with nausea Sent home with rx BLand diet If continues to have symptoms need abdominal ultrasound

## 2021-04-15 NOTE — Patient Instructions (Addendum)
Use zofran.  Get labs today.    Bland Diet A bland diet consists of foods that are often soft and do not have a lot of fat, fiber, or extra seasonings. Foods without fat, fiber, or seasoning are easier for the body to digest. They are also less likely to irritate your mouth, throat, stomach, and other parts of your digestive system. A bland diet is sometimes called a BRAT diet. What is my plan? Your health care provider or food and nutrition specialist (dietitian) may recommend specific changes to your diet to prevent symptoms or to treat your symptoms. These changes may include: Eating small meals often. Cooking food until it is soft enough to chew easily. Chewing your food well. Drinking fluids slowly. Not eating foods that are very spicy, sour, or fatty. Not eating citrus fruits, such as oranges and grapefruit. What do I need to know about this diet? Eat a variety of foods from the bland diet food list. Do not follow a bland diet longer than needed. Ask your health care provider whether you should take vitamins or supplements. What foods can I eat? Grains Hot cereals, such as cream of wheat. Rice. Bread, crackers, or tortillas made from refined white flour. Vegetables Canned or cooked vegetables. Mashed or boiled potatoes. Fruits Bananas. Applesauce. Other types of cooked or canned fruit with the skin and seeds removed, such as canned peaches or pears. Meats and other proteins Scrambled eggs. Creamy peanut butter or other nut butters. Lean, well-cooked meats, such as chicken or fish. Tofu. Soups or broths. Dairy Low-fat dairy products, such as milk, cottage cheese, or yogurt. Beverages Water. Herbal tea. Apple juice. Fats and oils Mild salad dressings. Canola or olive oil. Sweets and desserts Pudding. Custard. Fruit gelatin. Ice cream. The items listed above may not be a complete list of recommended foods and beverages. Contact a dietitian for more options. What foods are not  recommended? Grains Whole grain breads and cereals. Vegetables Raw vegetables. Fruits Raw fruits, especially citrus, berries, or dried fruits. Dairy Whole fat dairy foods. Beverages Caffeinated drinks. Alcohol. Seasonings and condiments Strongly flavored seasonings or condiments. Hot sauce. Salsa. Other foods Spicy foods. Fried foods. Sour foods, such as pickled or fermented foods. Foods with high sugar content. Foods high in fiber. The items listed above may not be a complete list of foods and beverages to avoid. Contact a dietitian for more information. Summary A bland diet consists of foods that are often soft and do not have a lot of fat, fiber, or extra seasonings. Foods without fat, fiber, or seasoning are easier for the body to digest. Check with your health care provider to see how long you should follow this diet plan. It is not meant to be followed for long periods. This information is not intended to replace advice given to you by your health care provider. Make sure you discuss any questions you have with your health care provider. Document Revised: 06/02/2017 Document Reviewed: 06/02/2017 Elsevier Patient Education  2022 [employer].

## 2021-04-15 NOTE — Progress Notes (Signed)
Neg for flu

## 2021-04-15 NOTE — Telephone Encounter (Signed)
Yes

## 2021-04-17 ENCOUNTER — Encounter: Payer: Self-pay | Admitting: Physician Assistant

## 2021-05-24 ENCOUNTER — Telehealth: Payer: Self-pay

## 2021-05-24 NOTE — Telephone Encounter (Signed)
Medication: cariprazine (VRAYLAR) 3 MG capsule Prior authorization submitted via CoverMyMeds on 05/24/2021 PA submission pending

## 2021-05-24 NOTE — Telephone Encounter (Signed)
Medication: cariprazine (VRAYLAR) 3 MG capsule Prior authorization determination received Medication has been approved Approval dates: 05/24/2021-05/17/2098  Patient aware via: [place] aware: Yes Provider aware via this encounter

## 2021-07-16 ENCOUNTER — Other Ambulatory Visit: Payer: Self-pay | Admitting: Physician Assistant

## 2021-07-16 DIAGNOSIS — G43109 Migraine with aura, not intractable, without status migrainosus: Secondary | ICD-10-CM

## 2021-07-20 ENCOUNTER — Encounter: Payer: Self-pay | Admitting: Physician Assistant

## 2021-07-31 ENCOUNTER — Telehealth: Payer: Self-pay

## 2021-07-31 NOTE — Telephone Encounter (Signed)
Initiated Prior authorization JAR:[id number] Sulfate (NURTEC) 75 MG TBDP ?Via: Covermymeds ?Case/Key:n/[id number] ?Status: cancelled as of  07/31/21 ?Reason:pt has inactive coverage ?Notified Pt via: Mychart ?

## 2021-09-05 ENCOUNTER — Encounter: Payer: Self-pay | Admitting: Physician Assistant

## 2021-09-05 ENCOUNTER — Telehealth (INDEPENDENT_AMBULATORY_CARE_PROVIDER_SITE_OTHER): Payer: Managed Care, Other (non HMO) | Admitting: Physician Assistant

## 2021-09-05 DIAGNOSIS — F3162 Bipolar disorder, current episode mixed, moderate: Secondary | ICD-10-CM | POA: Diagnosis not present

## 2021-09-05 DIAGNOSIS — G43109 Migraine with aura, not intractable, without status migrainosus: Secondary | ICD-10-CM

## 2021-09-05 DIAGNOSIS — F331 Major depressive disorder, recurrent, moderate: Secondary | ICD-10-CM | POA: Diagnosis not present

## 2021-09-05 DIAGNOSIS — F603 Borderline personality disorder: Secondary | ICD-10-CM | POA: Diagnosis not present

## 2021-09-05 DIAGNOSIS — F411 Generalized anxiety disorder: Secondary | ICD-10-CM

## 2021-09-05 DIAGNOSIS — Z639 Problem related to primary support group, unspecified: Secondary | ICD-10-CM

## 2021-09-05 MED ORDER — CARIPRAZINE HCL 3 MG PO CAPS: 3.0000 mg | ORAL_CAPSULE | Freq: Every day | ORAL | 1 refills | Status: DC

## 2021-09-05 MED ORDER — LAMOTRIGINE 100 MG PO TABS: ORAL_TABLET | ORAL | 1 refills | Status: DC

## 2021-09-05 MED ORDER — FLUOXETINE HCL 40 MG PO CAPS: 40.0000 mg | ORAL_CAPSULE | Freq: Every day | ORAL | 1 refills | Status: DC

## 2021-09-05 MED ORDER — NURTEC 75 MG PO TBDP: 1.0000 | ORAL_TABLET | ORAL | 5 refills | Status: DC

## 2021-09-05 MED ORDER — BUSPIRONE HCL 7.5 MG PO TABS: 7.5000 mg | ORAL_TABLET | Freq: Every day | ORAL | 1 refills | Status: DC

## 2021-09-05 NOTE — Progress Notes (Signed)
..Virtual Visit via Video Note ? ?I connected with [name] on 09/05/21 at  2:20 PM EDT by a video enabled telemedicine application and verified that I am speaking with the correct person using two identifiers. ? ?Location: ?Patient: car ?Provider: clinic ? ?[name].Participating in visit:  ?Patient: [name] ?Provider: Iran Planas PA-C ?  ?I discussed the limitations of evaluation and management by telemedicine and the availability of in person appointments. The patient expressed understanding and agreed to proceed. ? ?History of Present Illness: ?Pt is a 41 yo female with Bipolar, GAD, MDD who needs refills. Pt is doing great. She is not in counseling due to cost. She denies any SI/HC. She feels very stable. She is working at the [employer] and overall very happy.  ? ?.. ?Active Ambulatory Problems  ?  Diagnosis Date Noted  ? Left breast lump 03/05/2015  ? Borderline personality disorder ([place]) 07/09/2015  ? Generalized anxiety disorder 07/09/2015  ? Bipolar 1 disorder, mixed, moderate ([place]) 07/09/2015  ? Iron deficiency anemia 03/20/2016  ? Encounter for insertion of intrauterine contraceptive device (IUD) 08/05/2016  ? Postpartum depression 09/15/2016  ? Nevus 03/15/2017  ? Ocular migraine 10/04/2017  ? Worsening headaches 10/13/2019  ? Paresthesia 10/13/2019  ? Other symptoms and signs involving the nervous system 10/13/2019  ? Relationship problems 01/31/2020  ? Bipolar 1 disorder ([place]) 04/22/2020  ? MDD (major depressive disorder), recurrent episode, severe ([place]) 04/22/2020  ? 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 Moderate episode of recurrent major depressive disorder ([place]) 11/26/2020  ? ?Resolved Ambulatory Problems  ?  Diagnosis Date Noted  ? Nipple discharge 03/05/2015  ? Depression 07/09/2015  ? [redacted] weeks gestation of pregnancy 03/20/2016  ? Normal pregnancy 06/03/2016  ? Class 1 obesity due to excess calories without serious comorbidity with body mass index (BMI) of 32.0 to 32.9 in adult 04/20/2018   ? Upper extremity weakness 10/13/2019  ? ?Past Medical History:  ?Diagnosis Date  ? Headache   ? History of bipolar disorder   ? History of pancreatitis   ? Hx of varicella   ? Migraines   ? Vaginal Pap smear, abnormal   ? ? ?Observations/Objective: ?No acute distress ?Normal mood and appearance ?Normal breathing ? ?.. ? ?  09/05/2021  ?  4:23 PM 04/09/2021  ?  8:01 AM 11/26/2020  ?  6:55 AM 09/18/2020  ?  8:15 AM 06/24/2020  ?  3:15 PM  ?Depression screen PHQ 2/9  ?Decreased Interest 1 1 0 3 1  ?Down, Depressed, Hopeless '1 3 1 1 1  '$ ?PHQ - 2 Score '2 4 1 4 2  '$ ?Altered sleeping 0 '2 1 3 '$ 0  ?Tired, decreased energy 0 '3 1 3 2  '$ ?Change in appetite 0 3 1 0 1  ?Feeling bad or failure about yourself  0 '3 1 3 1  '$ ?Trouble concentrating 0 1 0 3 1  ?Moving slowly or fidgety/restless 0 1 0 3 2  ?Suicidal thoughts 0 0 0 1 1  ?PHQ-9 Score '2 17 5 20 10  '$ ?Difficult doing work/chores Not difficult at all Very difficult Somewhat difficult Somewhat difficult Somewhat difficult  ? ?.. ? ?  09/05/2021  ?  4:24 PM 04/09/2021  ?  8:03 AM 11/26/2020  ?  6:54 AM 09/18/2020  ?  8:17 AM  ?GAD 7 : Generalized Anxiety Score  ?Nervous, Anxious, on Edge '1 3 1 3  '$ ?Control/stop worrying 1 3 0 3  ?Worry too much - different things 1  $'3 1 3  'y$ ?Trouble relaxing 0 3 0 3  ?Restless 0 '3 1 3  '$ ?Easily annoyed or irritable '1 3 1 3  '$ ?Afraid - awful might happen 0 '3 1 3  '$ ?Total GAD 7 Score '4 21 5 21  '$ ?Anxiety Difficulty Not difficult at all Very difficult Somewhat difficult Somewhat difficult  ? ? ? ?Assessment and Plan: ?..[name] was seen today for follow-up. ? ?Diagnoses and all orders for this visit: ? ?Ocular migraine ?-     Rimegepant Sulfate (NURTEC) 75 MG TBDP; Take 1 tablet by mouth every other day. ? ?Bipolar 1 disorder, mixed, moderate (HCC) ?-     lamoTRIgine (LAMICTAL) 100 MG tablet; TAKE 1 TABLET (100 MG TOTAL) BY MOUTH 2 (TWO) TIMES DAILY. FOR MOOD STABILIZATION ?-     FLUoxetine (PROZAC) 40 MG capsule; Take 1 capsule (40 mg total) by mouth daily. ?-      cariprazine (VRAYLAR) 3 MG capsule; Take 1 capsule (3 mg total) by mouth daily. ? ?Moderate episode of recurrent major depressive disorder (HCC) ?-     lamoTRIgine (LAMICTAL) 100 MG tablet; TAKE 1 TABLET (100 MG TOTAL) BY MOUTH 2 (TWO) TIMES DAILY. FOR MOOD STABILIZATION ?-     FLUoxetine (PROZAC) 40 MG capsule; Take 1 capsule (40 mg total) by mouth daily. ? ?Borderline personality disorder ([place]) ?-     lamoTRIgine (LAMICTAL) 100 MG tablet; TAKE 1 TABLET (100 MG TOTAL) BY MOUTH 2 (TWO) TIMES DAILY. FOR MOOD STABILIZATION ?-     FLUoxetine (PROZAC) 40 MG capsule; Take 1 capsule (40 mg total) by mouth daily. ? ?Generalized anxiety disorder ?-     lamoTRIgine (LAMICTAL) 100 MG tablet; TAKE 1 TABLET (100 MG TOTAL) BY MOUTH 2 (TWO) TIMES DAILY. FOR MOOD STABILIZATION ?-     FLUoxetine (PROZAC) 40 MG capsule; Take 1 capsule (40 mg total) by mouth daily. ?-     busPIRone (BUSPAR) 7.5 MG tablet; Take 1 tablet (7.5 mg total) by mouth daily. ? ?Relationship problems ?-     FLUoxetine (PROZAC) 40 MG capsule; Take 1 capsule (40 mg total) by mouth daily. ? ? ?PHQ and GAD numbers greatly improved ?Refilled medications today ?Follow up in 6 months.  ? ? ?Follow Up Instructions: ? ?  ?I discussed the assessment and treatment plan with the patient. The patient was provided an opportunity to ask questions and all were answered. The patient agreed with the plan and demonstrated an understanding of the instructions. ?  ?The patient was advised to call back or seek an in-person evaluation if the symptoms worsen or if the condition fails to improve as anticipated. ? ? ? ?Iran Planas, PA-C ? ?

## 2021-10-06 ENCOUNTER — Ambulatory Visit: Payer: Managed Care, Other (non HMO) | Admitting: Physician Assistant

## 2021-12-17 ENCOUNTER — Encounter: Payer: Self-pay | Admitting: Neurology

## 2022-02-05 ENCOUNTER — Other Ambulatory Visit (HOSPITAL_COMMUNITY)
Admission: RE | Admit: 2022-02-05 | Discharge: 2022-02-05 | Disposition: A | Discharge status: Home / Self Care | Payer: Managed Care, Other (non HMO) | Source: Ambulatory Visit | Attending: Medical-Surgical | Admitting: Medical-Surgical

## 2022-02-05 ENCOUNTER — Encounter: Payer: Self-pay | Admitting: Medical-Surgical

## 2022-02-05 ENCOUNTER — Ambulatory Visit: Payer: Managed Care, Other (non HMO) | Admitting: Medical-Surgical

## 2022-02-05 VITALS — BP 102/68 | HR 80 | Resp 20 | Ht 64.0 in | Wt 176.5 lb

## 2022-02-05 DIAGNOSIS — Z01419 Encounter for gynecological examination (general) (routine) without abnormal findings: Secondary | ICD-10-CM

## 2022-02-05 DIAGNOSIS — Z30433 Encounter for removal and reinsertion of intrauterine contraceptive device: Secondary | ICD-10-CM | POA: Diagnosis not present

## 2022-02-05 NOTE — Progress Notes (Signed)
IUD PROCEDURE NOTE  PERTINENT RESULTS REVIEWED: PREGNANCY TEST PRIOR TO PROCEDURE: Not Available, current IUD in place and within the 5 year mark GONORRHEA/CHLAMYDIA SCREEN: Not Available  PRIOR TO PROCEDURE: INFORMED CONSENT OBTAINED: yes SEE SCANNED DOCUMENTS ANY PRETREATMENT: no  PHYSICAL EXAM: GYN: No lesions/ulcers to external genitalia, normal urethra, normal vaginal mucosa, physiologic discharge, cervix normal without lesions, uterus not enlarged or tender, adnexa no masses and nontender  DESCRIPTION OF PROCEDURE: Vaginal speculum placed. Pap smear with HPV cotesting performed. IUD strings easily visualized from external os.  Strings grasped with ring forceps and with patient giving increased abd pressure, strings pulled and IUD easily removed intact. Cervix and proximal vagina cleaned with Hibiclens.Tenaculum applied at 12:00 cervical position and gentle traction applied.  Difficulty sounding uterus and patient became lightheaded for a few moments so tenaculum removed and adjusted, replaced at the 12:00 cervical position.  Uterus sounded to 8cm (greater than 6cm). IUD placed without difficulty. IUD threads cut to 2-3cm from cervical os. Tenaculum and speculum removed. Patient tolerated procedure well. Sterile technique maintained. Full resolution of lightheadedness noted at the end of the appointment.   IUD INFORMATION: BRAND: Mirena LOT NUMBER: [id number] CARD GIVEN TO PATIENT: yes   ___________________________________________ Clearnce Sorrel, DNP, APRN, FNP-BC [facility] [place]

## 2022-02-09 LAB — CYTOLOGY - PAP
Comment: NEGATIVE
Diagnosis: NEGATIVE
High risk HPV: NEGATIVE

## 2022-03-23 ENCOUNTER — Ambulatory Visit: Payer: Managed Care, Other (non HMO) | Admitting: Physician Assistant

## 2022-03-23 DIAGNOSIS — Z1329 Encounter for screening for other suspected endocrine disorder: Secondary | ICD-10-CM

## 2022-03-23 DIAGNOSIS — Z131 Encounter for screening for diabetes mellitus: Secondary | ICD-10-CM

## 2022-03-23 DIAGNOSIS — D509 Iron deficiency anemia, unspecified: Secondary | ICD-10-CM

## 2022-03-23 DIAGNOSIS — Z13220 Encounter for screening for lipoid disorders: Secondary | ICD-10-CM

## 2022-10-05 ENCOUNTER — Ambulatory Visit: Payer: Managed Care, Other (non HMO) | Admitting: Physician Assistant

## 2022-10-05 ENCOUNTER — Encounter: Payer: Self-pay | Admitting: Physician Assistant

## 2022-10-05 VITALS — BP 142/74 | HR 76 | Ht 64.0 in | Wt 186.0 lb

## 2022-10-05 DIAGNOSIS — K625 Hemorrhage of anus and rectum: Secondary | ICD-10-CM | POA: Diagnosis not present

## 2022-10-05 DIAGNOSIS — R195 Other fecal abnormalities: Secondary | ICD-10-CM

## 2022-10-05 NOTE — Patient Instructions (Signed)
Get labs Referral to GI: Needs colonoscopy!

## 2022-10-05 NOTE — Progress Notes (Signed)
Acute Office Visit  Subjective:     Patient ID: [name], female    DOB: [birth year], 42 y.o.   MRN: [id number]  Chief Complaint  Patient presents with   Abdominal Pain    HPI Patient is in today for bright red blood in stool. She has actually had this for over a year but has not followed up on it. Thursday the bleeding became much worse and worried her because "it filled up the toilet". Blood is only with bowel movements. Stools are loose but have been like that "forever". She also noted mucus in stool as well.  No abdominal or rectal pain. No known hemorrhoids. Not aware of anything that makes worse or better. No family history of colon cancer. No weight loss, night sweats, or nausea.   .. Active Ambulatory Problems    Diagnosis Date Noted   Left breast lump 03/05/2015   Borderline personality disorder (HCC) 07/09/2015   Generalized anxiety disorder 07/09/2015   Bipolar 1 disorder, mixed, moderate (HCC) 07/09/2015   Iron deficiency anemia 03/20/2016   Encounter for insertion of intrauterine contraceptive device (IUD) 08/05/2016   Postpartum depression 09/15/2016   Nevus 03/15/2017   Ocular migraine 10/04/2017   Worsening headaches 10/13/2019   Paresthesia 10/13/2019   Other symptoms and signs involving the nervous system 10/13/2019   Relationship problems 01/31/2020   Bipolar 1 disorder (HCC) 04/22/2020   MDD (major depressive disorder), recurrent episode, severe (HCC) 04/22/2020   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/2020   Moderate episode of recurrent major depressive disorder (HCC) 11/26/2020   Bright red blood per rectum 10/05/2022   Resolved Ambulatory Problems    Diagnosis Date Noted   Nipple discharge 03/05/2015   Depression 07/09/2015   [redacted] weeks gestation of pregnancy 03/20/2016   Normal pregnancy 06/03/2016   Class 1 obesity due to excess calories without serious comorbidity with body mass index (BMI) of 32.0 to 32.9 in adult 04/20/2018   Upper  extremity weakness 10/13/2019   Past Medical History:  Diagnosis Date   Headache    History of bipolar disorder    History of pancreatitis    Hx of varicella    Migraines    Vaginal Pap smear, abnormal      ROS See HPI.      Objective:    BP (!) 142/74   Pulse 76   Ht 5\' 4"  (1.626 m)   Wt 186 lb (84.4 kg)   SpO2 99%   BMI 31.93 kg/m  BP Readings from Last 3 Encounters:  10/05/22 (!) 142/74  02/05/22 102/68  04/15/21 104/68   Wt Readings from Last 3 Encounters:  10/05/22 186 lb (84.4 kg)  02/05/22 176 lb 8 oz (80.1 kg)  04/15/21 176 lb (79.8 kg)      Physical Exam Constitutional:      Appearance: She is well-developed. She is obese.  HENT:     Head: Normocephalic.  Abdominal:     General: Abdomen is flat. Bowel sounds are normal. There is no distension.     Palpations: Abdomen is soft.     Tenderness: There is no abdominal tenderness.     Hernia: No hernia is present.  Genitourinary:    Rectum: Normal. Guaiac result negative. No mass.  Neurological:     General: No focal deficit present.     Mental Status: She is alert and oriented to person, place, and time.  Psychiatric:        Mood and Affect:  Mood is anxious.          Assessment & Plan:  [name] KitchenMarland KitchenRoxsana was seen today for abdominal pain.  Diagnoses and all orders for this visit:  Bright red blood per rectum -     CBC w/Diff/Platelet -     COMPLETE METABOLIC PANEL WITH GFR -     TSH -     Fe+TIBC+Fer -     Lipase -     Ambulatory referral to Gastroenterology  Loose stools -     CBC w/Diff/Platelet -     COMPLETE METABOLIC PANEL WITH GFR -     TSH -     Fe+TIBC+Fer -     Lipase -     Ambulatory referral to Gastroenterology  Mucus in stool -     Ambulatory referral to Gastroenterology   Negative hemoccult today with no evidence of hemorrhoids Needs colonoscopy, referral placed Labs ordered today to follow up on loose stools and rectal bleeding Follow up as needed or if bleeding does not  stop go to ED.    Tandy Gaw, PA-C

## 2022-10-06 ENCOUNTER — Encounter: Payer: Self-pay | Admitting: Physician Assistant

## 2022-10-06 LAB — CBC WITH DIFFERENTIAL/PLATELET
Absolute Monocytes: 623 cells/uL (ref 200–950)
Basophils Absolute: 140 cells/uL (ref 0–200)
Basophils Relative: 1.5 %
Eosinophils Absolute: 112 cells/uL (ref 15–500)
Eosinophils Relative: 1.2 %
HCT: 44 % (ref 35.0–45.0)
Hemoglobin: 14.8 g/dL (ref 11.7–15.5)
Lymphs Abs: 1135 cells/uL (ref 850–3900)
MCH: 30.1 pg (ref 27.0–33.0)
MCHC: 33.6 g/dL (ref 32.0–36.0)
MCV: 89.6 fL (ref 80.0–100.0)
MPV: 9.9 fL (ref 7.5–12.5)
Monocytes Relative: 6.7 %
Neutro Abs: 7291 cells/uL (ref 1500–7800)
Neutrophils Relative %: 78.4 %
Platelets: 426 10*3/uL — ABNORMAL HIGH (ref 140–400)
RBC: 4.91 10*6/uL (ref 3.80–5.10)
RDW: 12 % (ref 11.0–15.0)
Total Lymphocyte: 12.2 %
WBC: 9.3 10*3/uL (ref 3.8–10.8)

## 2022-10-06 LAB — COMPLETE METABOLIC PANEL WITH GFR
AG Ratio: 2.1 (calc) (ref 1.0–2.5)
ALT: 19 U/L (ref 6–29)
AST: 18 U/L (ref 10–30)
Albumin: 4.6 g/dL (ref 3.6–5.1)
Alkaline phosphatase (APISO): 80 U/L (ref 31–125)
BUN: 10 mg/dL (ref 7–25)
CO2: 27 mmol/L (ref 20–32)
Calcium: 9.5 mg/dL (ref 8.6–10.2)
Chloride: 104 mmol/L (ref 98–110)
Creat: 0.85 mg/dL (ref 0.50–0.99)
Globulin: 2.2 g/dL (calc) (ref 1.9–3.7)
Glucose, Bld: 84 mg/dL (ref 65–99)
Potassium: 4.7 mmol/L (ref 3.5–5.3)
Sodium: 140 mmol/L (ref 135–146)
Total Bilirubin: 0.6 mg/dL (ref 0.2–1.2)
Total Protein: 6.8 g/dL (ref 6.1–8.1)
eGFR: 88 mL/min/{1.73_m2} (ref >=60)

## 2022-10-06 LAB — IRON,TIBC AND FERRITIN PANEL
%SAT: 23 % (calc) (ref 16–45)
Ferritin: 49 ng/mL (ref 16–232)
Iron: 62 ug/dL (ref 40–190)
TIBC: 273 mcg/dL (calc) (ref 250–450)

## 2022-10-06 LAB — TSH: TSH: 1.86 mIU/L

## 2022-10-06 LAB — LIPASE: Lipase: 23 U/L (ref 7–60)

## 2022-10-06 NOTE — Progress Notes (Signed)
Hemoglobin is good. Iron stores and serum iron look great. No sign of acute blood loss.  Kidney, liver, glucose looks good.  Thyroid looks great.

## 2023-01-01 ENCOUNTER — Encounter: Payer: Self-pay | Admitting: Physician Assistant

## 2023-01-01 ENCOUNTER — Telehealth (INDEPENDENT_AMBULATORY_CARE_PROVIDER_SITE_OTHER): Payer: Managed Care, Other (non HMO) | Admitting: Physician Assistant

## 2023-01-01 DIAGNOSIS — G43109 Migraine with aura, not intractable, without status migrainosus: Secondary | ICD-10-CM | POA: Diagnosis not present

## 2023-01-01 MED ORDER — QULIPTA 10 MG PO TABS
ORAL_TABLET | ORAL | 1 refills | Status: DC
Start: 2023-01-01 — End: 2023-04-13

## 2023-01-01 MED ORDER — UBRELVY 50 MG PO TABS
1.0000 | ORAL_TABLET | ORAL | 3 refills | Status: DC | PRN
Start: 2023-01-01 — End: 2023-06-25

## 2023-01-01 NOTE — Progress Notes (Signed)
An attempt to reach was made at 9: 38am  Left vm to return call back to clinic

## 2023-01-01 NOTE — Progress Notes (Signed)
..Virtual Visit via Video Note  I connected with [name] on 01/01/23 at  9:50 AM EDT by a video enabled telemedicine application and verified that I am speaking with the correct person using two identifiers.  Location: Patient: work Provider: clinic  .[name] KitchenParticipating in visit:  Patient: [name] Provider: Tandy Gaw PA-C   I discussed the limitations of evaluation and management by telemedicine and the availability of in person appointments. The patient expressed understanding and agreed to proceed.  History of Present Illness: Pt is a 42 yo female with hx of ocular migraines who calls in with worsening migraines. Migraine starts behind the right eye most of the time and moves to the left eye. She does have auras. She is unaware of triggers. She went to eye doctor and got glasses but not helping. Having 2 at least a week. Increased in frequency about 2 months ago. She has failed topmax, ajovy, nurtec, lamictal for prevention and maxalt, nurtec for rescue. She is taking lots of NSAIDs. She is sleeping ok but hard to go to sleep and hard to get up.   .. Active Ambulatory Problems    Diagnosis Date Noted   Left breast lump 03/05/2015   Borderline personality disorder (HCC) 07/09/2015   Generalized anxiety disorder 07/09/2015   Bipolar 1 disorder, mixed, moderate (HCC) 07/09/2015   Iron deficiency anemia 03/20/2016   Encounter for insertion of intrauterine contraceptive device (IUD) 08/05/2016   Postpartum depression 09/15/2016   Nevus 03/15/2017   Ocular migraine 10/04/2017   Worsening headaches 10/13/2019   Paresthesia 10/13/2019   Other symptoms and signs involving the nervous system 10/13/2019   Relationship problems 01/31/2020   Bipolar 1 disorder (HCC) 04/22/2020   MDD (major depressive disorder), recurrent episode, severe (HCC) 04/22/2020   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/2020   Moderate episode of recurrent major depressive disorder (HCC) 11/26/2020   Bright  red Turner per rectum 10/05/2022   Resolved Ambulatory Problems    Diagnosis Date Noted   Nipple discharge 03/05/2015   Depression 07/09/2015   [redacted] weeks gestation of pregnancy 03/20/2016   Normal pregnancy 06/03/2016   Class 1 obesity due to excess calories without serious comorbidity with body mass index (BMI) of 32.0 to 32.9 in adult 04/20/2018   Upper extremity weakness 10/13/2019   Past Medical History:  Diagnosis Date   Headache    History of bipolar disorder    History of pancreatitis    Hx of varicella    Migraines    Vaginal Pap smear, abnormal         Observations/Objective: No acute distress Normal mood and appearance   Assessment and Plan: .[name] KitchenAmada Jupiter was seen today for medical management of chronic issues.  Diagnoses and all orders for this visit:  Ocular migraine -     Ubrogepant (UBRELVY) 50 MG TABS; Take 1 tablet (50 mg total) by mouth as needed (for migraine). -     Atogepant (QULIPTA) 10 MG TABS; Take one tablet daily for migraine prevention.   Start qulipta for Advice worker for rescue Start magnesium 400mg  at bedtime Get headache diary Follow up in 1 month   Follow Up Instructions:    I discussed the assessment and treatment plan with the patient. The patient was provided an opportunity to ask questions and all were answered. The patient agreed with the plan and demonstrated an understanding of the instructions.   The patient was advised to call back or seek an in-person evaluation if the symptoms  worsen or if the condition fails to improve as anticipated.    Tandy Gaw, PA-C

## 2023-01-01 NOTE — Patient Instructions (Signed)

## 2023-01-04 ENCOUNTER — Telehealth: Payer: Managed Care, Other (non HMO) | Admitting: Physician Assistant

## 2023-01-19 ENCOUNTER — Telehealth: Payer: Self-pay | Admitting: General Practice

## 2023-01-19 NOTE — Transitions of Care (Post Inpatient/ED Visit) (Signed)
   01/19/2023  Name: [name] MRN: [id number] DOB: [birth year]  Today's TOC FU Call Status: Today's TOC FU Call Status:: Successful TOC FU Call Completed Unsuccessful Call (1st Attempt) Date: 01/19/23 [facility] FU Call Complete Date: 01/19/23 Patient's Name and Date of Birth confirmed.  Transition Care Management Follow-up Telephone Call Date of Discharge: 01/16/23 Discharge Facility: Other ([facility]) Name of Other ([facility]) Discharge Facility: [facility] Type of Discharge: Emergency Department Reason for ED Visit: Other: (laceration) How have you been since you were released from the hospital?: Better Any questions or concerns?: No  Items Reviewed: Did you receive and understand the discharge instructions provided?: Yes Medications obtained,verified, and reconciled?: Yes (Medications Reviewed) Any new allergies since your discharge?: No Dietary orders reviewed?: NA Do you have support at home?: Yes  Medications Reviewed Today: Medications Reviewed Today     Reviewed by Modesto Charon, RN (Registered Nurse) on 01/19/23 at 1542  Med List Status: <None>   Medication Order Taking? Sig Documenting Provider Last Dose Status Informant  Atogepant (QULIPTA) 10 MG TABS [id number]  Take one tablet daily for migraine prevention. Breeback, Jade L, PA-C  Active   Ubrogepant (UBRELVY) 50 MG TABS [id number]  Take 1 tablet (50 mg total) by mouth as needed (for migraine). Jomarie Longs, PA-C  Active             Home Care and Equipment/Supplies: Were Home Health Services Ordered?: NA Any new equipment or medical supplies ordered?: NA  Functional Questionnaire: Do you need assistance with bathing/showering or dressing?: No Do you need assistance with meal preparation?: No Do you need assistance with eating?: No Do you have difficulty maintaining continence: No Do you need assistance with getting out of bed/getting out of a chair/moving?: No Do you have difficulty managing or taking  your medications?: No  Follow up appointments reviewed: PCP Follow-up appointment confirmed?: NA Specialist Hospital Follow-up appointment confirmed?: NA Do you need transportation to your follow-up appointment?: No Do you understand care options if your condition(s) worsen?: Yes-patient verbalized understanding  SDOH Interventions Today    Flowsheet Row Most Recent Value  SDOH Interventions   Transportation Interventions Intervention Not Indicated       SIGNATURE Modesto Charon, RN BSN Nurse Health Advisor

## 2023-01-19 NOTE — Transitions of Care (Post Inpatient/ED Visit) (Signed)
   01/19/2023  Name: [name] MRN: [id number] DOB: [birth year]  Today's TOC FU Call Status: Today's TOC FU Call Status:: Unsuccessful Call (1st Attempt) Unsuccessful Call (1st Attempt) Date: 01/19/23  Attempted to reach the patient regarding the most recent Inpatient/ED visit.  Follow Up Plan: Additional outreach attempts will be made to reach the patient to complete the Transitions of Care (Post Inpatient/ED visit) call.   Signature Modesto Charon, Control and instrumentation engineer

## 2023-02-01 ENCOUNTER — Encounter: Payer: Self-pay | Admitting: Physician Assistant

## 2023-02-10 ENCOUNTER — Encounter: Payer: Self-pay | Admitting: Physician Assistant

## 2023-02-11 ENCOUNTER — Telehealth: Payer: Self-pay

## 2023-02-11 NOTE — Telephone Encounter (Addendum)
Initiated Prior authorization UXL:[id number] 50MG  tablets Via: Covermymeds Case/Key:[id number] Status: approved  as of 02/11/23 Reason:Authorization Expiration Date: 02/10/2024 Notified Pt via: Mychart    Initiated Prior authorization VOZ:[id number] 10MG  tablets Via: Covermymeds Case/Key:[id number] Status: approved  as of 02/11/23 Reason:Authorization Expiration Date: 02/10/2024 Notified Pt via: Mychart

## 2023-04-13 ENCOUNTER — Encounter: Payer: Self-pay | Admitting: Physician Assistant

## 2023-04-13 ENCOUNTER — Ambulatory Visit: Payer: Managed Care, Other (non HMO) | Admitting: Physician Assistant

## 2023-04-13 ENCOUNTER — Ambulatory Visit: Payer: Managed Care, Other (non HMO)

## 2023-04-13 VITALS — BP 109/68 | HR 76 | Temp 97.7°F | Resp 20 | Ht 64.0 in | Wt 177.0 lb

## 2023-04-13 DIAGNOSIS — M545 Low back pain, unspecified: Secondary | ICD-10-CM | POA: Insufficient documentation

## 2023-04-13 DIAGNOSIS — M62830 Muscle spasm of back: Secondary | ICD-10-CM | POA: Insufficient documentation

## 2023-04-13 DIAGNOSIS — M549 Dorsalgia, unspecified: Secondary | ICD-10-CM

## 2023-04-13 DIAGNOSIS — G8929 Other chronic pain: Secondary | ICD-10-CM | POA: Diagnosis not present

## 2023-04-13 MED ORDER — METHOCARBAMOL 500 MG PO TABS: 500.0000 mg | ORAL_TABLET | Freq: Three times a day (TID) | ORAL | 0 refills | Status: DC

## 2023-04-13 MED ORDER — IBUPROFEN 800 MG PO TABS
800.0000 mg | ORAL_TABLET | Freq: Three times a day (TID) | ORAL | 0 refills | Status: AC | PRN
Start: 2023-04-13 — End: ?

## 2023-04-13 MED ORDER — KETOROLAC TROMETHAMINE 60 MG/2ML IM SOLN
60.0000 mg | Freq: Once | INTRAMUSCULAR | Status: AC
Start: 2023-04-13 — End: 2023-04-13
  Administered 2023-04-13: 60 mg via INTRAMUSCULAR

## 2023-04-13 NOTE — Progress Notes (Signed)
   Acute Office Visit  Subjective:     Patient ID: [name], female    DOB: 1980-10-18, 42 y.o.   MRN: [id number]  Chief Complaint  Patient presents with   Back Pain    LOWER    Back Pain Associated symptoms include tingling. Pertinent negatives include no dysuria.   Patient is in today for lower back pain that has been mild for about 6 months on/off but got much worse on Tuesday, 04/06/23. The pain is now a constant ache, but if she moves a certain way it becomes sharp. Walking, standing, laying down, and sitting hurts it. Denies falls, injury, or heavy lifting. Reports hand and arm tingling. She has tried ibuprofen, which doesn't help. She also tried heating pad which didn't help. No LE pain radiation, numbness or tingling. No upper back or neck pain. No urinary symptoms, urinary incontinence, or saddle anesthesia.    Review of Systems  Genitourinary:  Negative for dysuria, frequency, hematuria and urgency.  Musculoskeletal:  Positive for back pain. Negative for falls and neck pain.  Neurological:  Positive for tingling.      Objective:    BP 109/68   Pulse 76   Temp 97.7 F (36.5 C)   Resp 20   Ht 5\' 4"  (1.626 m)   Wt 177 lb (80.3 kg)   SpO2 100%   BMI 30.38 kg/m    Physical Exam Musculoskeletal:     Cervical back: Normal.     Thoracic back: Normal.     Lumbar back: Tenderness and bony tenderness present. No swelling, edema, deformity, signs of trauma, lacerations or spasms. Decreased range of motion. Negative right straight leg raise test and negative left straight leg raise test. No scoliosis.     Comments: TTP over spine and paraspinal muscles. ROM and SLR limited by pain. Pain worse with extension than flexion. SLR to 70 degrees then pain.    Assessment & Plan:  [name] KitchenMarland KitchenDarrielle was seen today for back pain.  Diagnoses and all orders for this visit:  Chronic bilateral low back pain without sciatica -     DG Lumbar Spine Complete; Future -     ibuprofen (ADVIL)  800 MG tablet; Take 1 tablet (800 mg total) by mouth every 8 (eight) hours as needed. -     methocarbamol (ROBAXIN) 500 MG tablet; Take 1 tablet (500 mg total) by mouth 3 (three) times daily. -     ketorolac (TORADOL) injection 60 mg    Discussed with patient it is most likely muscular etiology due to paraspinal TTP. Discussed with patient natural aging processes can cause back pain with minor stress. Discussed with patient proper lifting techniques. Recommended tens units in morning and evening and icy hot patches during day. Prescribed Robaxin 500 mg- explained to patient that she could take 1/2 tablet during day, or if it makes her sleepy, recommended full tablet at night. Toradol injection today for inflammation. Ibuprofen 800 mg for inflammation to start tomorrow.  If no relief in 24 hrs, will send prednisone. HO of back and core stretches and exercises provided. No red flag symptoms present. Spinal xray to rule out bony or disc pathology.    Stacy Gaw, PA-C

## 2023-04-13 NOTE — Patient Instructions (Addendum)
Tens therapy, icy hot patches, ibuprofen 800mg  three times a day for next 5 days, robaxin as needed up to three times a day.  Get xray today  Low Back Sprain or Strain Rehab Ask your health care provider which exercises are safe for you. Do exercises exactly as told by your health care provider and adjust them as directed. It is normal to feel mild stretching, pulling, tightness, or discomfort as you do these exercises. Stop right away if you feel sudden pain or your pain gets worse. Do not begin these exercises until told by your health care provider. Stretching and range-of-motion exercises These exercises warm up your muscles and joints and improve the movement and flexibility of your back. These exercises also help to relieve pain, numbness, and tingling. Lumbar rotation  Lie on your back on a firm bed or the floor with your knees bent. Straighten your arms out to your sides so each arm forms a 90-degree angle (right angle) with a side of your body. Slowly move (rotate) both of your knees to one side of your body until you feel a stretch in your lower back (lumbar). Try not to let your shoulders lift off the floor. Hold this position for __________ seconds. Tense your abdominal muscles and slowly move your knees back to the starting position. Repeat this exercise on the other side of your body. Repeat __________ times. Complete this exercise __________ times a day. Single knee to chest  Lie on your back on a firm bed or the floor with both legs straight. Bend one of your knees. Use your hands to move your knee up toward your chest until you feel a gentle stretch in your lower back and buttock. Hold your leg in this position by holding on to the front of your knee. Keep your other leg as straight as possible. Hold this position for __________ seconds. Slowly return to the starting position. Repeat with your other leg. Repeat __________ times. Complete this exercise __________ times a  day. Prone extension on elbows  Lie on your abdomen on a firm bed or the floor (prone position). Prop yourself up on your elbows. Use your arms to help lift your chest up until you feel a gentle stretch in your abdomen and your lower back. This will place some of your body weight on your elbows. If this is uncomfortable, try stacking pillows under your chest. Your hips should stay down, against the surface that you are lying on. Keep your hip and back muscles relaxed. Hold this position for __________ seconds. Slowly relax your upper body and return to the starting position. Repeat __________ times. Complete this exercise __________ times a day. Strengthening exercises These exercises build strength and endurance in your back. Endurance is the ability to use your muscles for a long time, even after they get tired. Pelvic tilt This exercise strengthens the muscles that lie deep in the abdomen. Lie on your back on a firm bed or the floor with your legs extended. Bend your knees so they are pointing toward the ceiling and your feet are flat on the floor. Tighten your lower abdominal muscles to press your lower back against the floor. This motion will tilt your pelvis so your tailbone points up toward the ceiling instead of pointing to your feet or the floor. To help with this exercise, you may place a small towel under your lower back and try to push your back into the towel. Hold this position for __________ seconds. Let  your muscles relax completely before you repeat this exercise. Repeat __________ times. Complete this exercise __________ times a day. Alternating arm and leg raises  Get on your hands and knees on a firm surface. If you are on a hard floor, you may want to use padding, such as an exercise mat, to cushion your knees. Line up your arms and legs. Your hands should be directly below your shoulders, and your knees should be directly below your hips. Lift your left leg behind you.  At the same time, raise your right arm and straighten it in front of you. Do not lift your leg higher than your hip. Do not lift your arm higher than your shoulder. Keep your abdominal and back muscles tight. Keep your hips facing the ground. Do not arch your back. Keep your balance carefully, and do not hold your breath. Hold this position for __________ seconds. Slowly return to the starting position. Repeat with your right leg and your left arm. Repeat __________ times. Complete this exercise __________ times a day. Abdominal set with straight leg raise  Lie on your back on a firm bed or the floor. Bend one of your knees and keep your other leg straight. Tense your abdominal muscles and lift your straight leg up, 4-6 inches (10-15 cm) off the ground. Keep your abdominal muscles tight and hold this position for __________ seconds. Do not hold your breath. Do not arch your back. Keep it flat against the ground. Keep your abdominal muscles tense as you slowly lower your leg back to the starting position. Repeat with your other leg. Repeat __________ times. Complete this exercise __________ times a day. Single leg lower with bent knees Lie on your back on a firm bed or the floor. Tense your abdominal muscles and lift your feet off the floor, one foot at a time, so your knees and hips are bent in 90-degree angles (right angles). Your knees should be over your hips and your lower legs should be parallel to the floor. Keeping your abdominal muscles tense and your knee bent, slowly lower one of your legs so your toe touches the ground. Lift your leg back up to return to the starting position. Do not hold your breath. Do not let your back arch. Keep your back flat against the ground. Repeat with your other leg. Repeat __________ times. Complete this exercise __________ times a day. Posture and body mechanics Good posture and healthy body mechanics can help to relieve stress in your body's  tissues and joints. Body mechanics refers to the movements and positions of your body while you do your daily activities. Posture is part of body mechanics. Good posture means: Your spine is in its natural S-curve position (neutral). Your shoulders are pulled back slightly. Your head is not tipped forward (neutral). Follow these guidelines to improve your posture and body mechanics in your everyday activities. Standing  When standing, keep your spine neutral and your feet about hip-width apart. Keep a slight bend in your knees. Your ears, shoulders, and hips should line up. When you do a task in which you stand in one place for a long time, place one foot up on a stable object that is 2-4 inches (5-10 cm) high, such as a footstool. This helps keep your spine neutral. Sitting  When sitting, keep your spine neutral and keep your feet flat on the floor. Use a footrest, if necessary, and keep your thighs parallel to the floor. Avoid rounding your shoulders, and avoid  tilting your head forward. When working at a desk or a computer, keep your desk at a height where your hands are slightly lower than your elbows. Slide your chair under your desk so you are close enough to maintain good posture. When working at a computer, place your monitor at a height where you are looking straight ahead and you do not have to tilt your head forward or downward to look at the screen. Resting When lying down and resting, avoid positions that are most painful for you. If you have pain with activities such as sitting, bending, stooping, or squatting, lie in a position in which your body does not bend very much. For example, avoid curling up on your side with your arms and knees near your chest (fetal position). If you have pain with activities such as standing for a long time or reaching with your arms, lie with your spine in a neutral position and bend your knees slightly. Try the following positions: Lying on your side  with a pillow between your knees. Lying on your back with a pillow under your knees. Lifting  When lifting objects, keep your feet at least shoulder-width apart and tighten your abdominal muscles. Bend your knees and hips and keep your spine neutral. It is important to lift using the strength of your legs, not your back. Do not lock your knees straight out. Always ask for help to lift heavy or awkward objects. This information is not intended to replace advice given to you by your health care provider. Make sure you discuss any questions you have with your health care provider. Document Revised: 09/07/2022 Document Reviewed: 07/22/2020 Elsevier Patient Education  2024 [employer].

## 2023-04-14 ENCOUNTER — Encounter: Payer: Self-pay | Admitting: Physician Assistant

## 2023-04-14 MED ORDER — PREDNISONE 50 MG PO TABS
ORAL_TABLET | ORAL | 0 refills | Status: DC
Start: 2023-04-14 — End: 2023-06-25

## 2023-04-14 MED ORDER — TRAMADOL HCL 50 MG PO TABS: 50.0000 mg | ORAL_TABLET | Freq: Three times a day (TID) | ORAL | 0 refills | Status: AC | PRN

## 2023-04-19 NOTE — Progress Notes (Signed)
No abnormalities found on xray. No significant degeneration. How is your back pain today?

## 2023-04-27 ENCOUNTER — Other Ambulatory Visit: Payer: Self-pay

## 2023-04-27 DIAGNOSIS — G43109 Migraine with aura, not intractable, without status migrainosus: Secondary | ICD-10-CM

## 2023-04-27 MED ORDER — QULIPTA 10 MG PO TABS: ORAL_TABLET | ORAL | 1 refills | Status: DC

## 2023-06-25 ENCOUNTER — Encounter: Payer: Self-pay | Admitting: Physician Assistant

## 2023-06-25 ENCOUNTER — Telehealth: Payer: Managed Care, Other (non HMO) | Admitting: Physician Assistant

## 2023-06-25 DIAGNOSIS — G43109 Migraine with aura, not intractable, without status migrainosus: Secondary | ICD-10-CM

## 2023-06-25 DIAGNOSIS — G43E11 Chronic migraine with aura, intractable, with status migrainosus: Secondary | ICD-10-CM

## 2023-06-25 MED ORDER — QULIPTA 10 MG PO TABS: ORAL_TABLET | ORAL | 1 refills | Status: DC

## 2023-06-25 MED ORDER — GABAPENTIN 100 MG PO CAPS: ORAL_CAPSULE | ORAL | 2 refills | Status: DC

## 2023-06-25 MED ORDER — UBRELVY 50 MG PO TABS: 1.0000 | ORAL_TABLET | ORAL | 5 refills | Status: DC | PRN

## 2023-06-25 NOTE — Progress Notes (Signed)
 ..Virtual Visit via Video Note  I connected with [name] on 06/28/23 at 10:10 AM EST by a video enabled telemedicine application and verified that I am speaking with the correct person using two identifiers.  Location: Patient: work Provider: clinic  .SABRAParticipating in visit:  Patient: [name] Provider: Vermell Bologna PA-C   I discussed the limitations of evaluation and management by telemedicine and the availability of in person appointments. The patient expressed understanding and agreed to proceed.  History of Present Illness: Pt is a 43 yo female who calls into the clinic who continues to struggle with migraines. She is having 16 to 17 migraine days a month. She has failed Ajovy , Topamax , nurtec. She is using ubrelvy  for rescue and helps most of the time but still has to take NsAIDs with it most of the time. .. Active Ambulatory Problems    Diagnosis Date Noted   Left breast lump 03/05/2015   Borderline personality disorder (HCC) 07/09/2015   Generalized anxiety disorder 07/09/2015   Bipolar 1 disorder, mixed, moderate (HCC) 07/09/2015   Iron deficiency anemia 03/20/2016   Encounter for insertion of intrauterine contraceptive device (IUD) 08/05/2016   Postpartum depression 09/15/2016   Nevus 03/15/2017   Ocular migraine 10/04/2017   Worsening headaches 10/13/2019   Paresthesia 10/13/2019   Other symptoms and signs involving the nervous system 10/13/2019   Relationship problems 01/31/2020   Bipolar 1 disorder (HCC) 04/22/2020   MDD (major depressive disorder), recurrent episode, severe (HCC) 04/22/2020   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/2020   Moderate episode of recurrent major depressive disorder (HCC) 11/26/2020   Bright red blood per rectum 10/05/2022   Muscle spasm of back 04/13/2023   Chronic bilateral low back pain without sciatica 04/13/2023   Resolved Ambulatory Problems    Diagnosis Date Noted   Nipple discharge 03/05/2015   Depression 07/09/2015    [redacted] weeks gestation of pregnancy 03/20/2016   Normal pregnancy 06/03/2016   Class 1 obesity due to excess calories without serious comorbidity with body mass index (BMI) of 32.0 to 32.9 in adult 04/20/2018   Upper extremity weakness 10/13/2019   Past Medical History:  Diagnosis Date   Headache    History of bipolar disorder    History of pancreatitis    Hx of varicella    Migraines    Vaginal Pap smear, abnormal       Observations/Objective: No acute distress  Normal mood and appearance     Assessment and Plan: SABRASABRADiagnoses and all orders for this visit:  Intractable chronic migraine with aura with status migrainosus -     AMB referral to headache clinic -     gabapentin  (NEURONTIN ) 100 MG capsule; One to three tablets at bedtime.  Ocular migraine -     Atogepant  (QULIPTA ) 10 MG TABS; Take one tablet daily for migraine prevention. -     AMB referral to headache clinic -     Ubrogepant  (UBRELVY ) 50 MG TABS; Take 1 tablet (50 mg total) by mouth as needed (for migraine).    I would like for patient to try Qulipta  after failed nurtec, topamax , ajovy .  Refilled ubrelvy  Start gabaptentin at bedtime to see if helps prevent any migraines Referral to HA specialist to consider other options and botox   Follow Up Instructions:    I discussed the assessment and treatment plan with the patient. The patient was provided an opportunity to ask questions and all were answered. The patient agreed with the plan and demonstrated  an understanding of the instructions.   The patient was advised to call back or seek an in-person evaluation if the symptoms worsen or if the condition fails to improve as anticipated.    Kamyla Olejnik, PA-C

## 2023-06-28 ENCOUNTER — Encounter: Payer: Self-pay | Admitting: Physician Assistant

## 2023-09-03 ENCOUNTER — Other Ambulatory Visit (HOSPITAL_BASED_OUTPATIENT_CLINIC_OR_DEPARTMENT_OTHER): Payer: Self-pay | Admitting: Physician Assistant

## 2023-09-03 DIAGNOSIS — Z1231 Encounter for screening mammogram for malignant neoplasm of breast: Secondary | ICD-10-CM

## 2023-09-09 ENCOUNTER — Ambulatory Visit

## 2023-09-09 DIAGNOSIS — Z1231 Encounter for screening mammogram for malignant neoplasm of breast: Secondary | ICD-10-CM | POA: Diagnosis not present

## 2023-09-13 ENCOUNTER — Encounter: Payer: Self-pay | Admitting: Physician Assistant

## 2023-09-13 NOTE — Progress Notes (Signed)
 Normal mammogram. Follow up in 1 year.

## 2023-11-03 ENCOUNTER — Ambulatory Visit: Admitting: Physician Assistant

## 2023-11-09 ENCOUNTER — Ambulatory Visit: Admitting: Urgent Care

## 2023-11-09 ENCOUNTER — Ambulatory Visit: Admitting: Medical-Surgical

## 2023-11-09 ENCOUNTER — Encounter: Payer: Self-pay | Admitting: Urgent Care

## 2023-11-09 VITALS — BP 133/88 | HR 95 | Resp 18 | Ht 64.0 in | Wt 179.0 lb

## 2023-11-09 DIAGNOSIS — Z30431 Encounter for routine checking of intrauterine contraceptive device: Secondary | ICD-10-CM

## 2023-11-09 DIAGNOSIS — N898 Other specified noninflammatory disorders of vagina: Secondary | ICD-10-CM

## 2023-11-09 NOTE — Patient Instructions (Signed)
 IUD strings appear normal. Nuswab sent out, will message with results once obtained.

## 2023-11-09 NOTE — Progress Notes (Signed)
 Established Patient Office Visit  Subjective:  Patient ID: [name], Turner    DOB: 07-13-1980  Age: 43 y.o. MRN: [id number]  Chief Complaint  Patient presents with   Contraception    IUD Check pt states she never came back to have her strings cut after placement    [name] presents today primarily for IUD check. She denies any concerns or complaints with it, but states she is about to start a serious relationship and wants to make sure everything is in check for prevention of pregnancy. Has mirena  IUD, placed 2 years ago. She does report a strong vaginal odor, noted primarily when using the bathroom. Denies pelvic pain, pressure, discharge, itching, dysuria or hematuria. No flank pain. Has not been sexually active in 5 years.    Patient Active Problem List   Diagnosis Date Noted   Muscle spasm of back 04/13/2023   Chronic bilateral low back pain without sciatica 04/13/2023   Bright red blood per rectum 10/05/2022   Moderate episode of recurrent major depressive disorder (HCC) 11/26/2020   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/2020   Bipolar 1 disorder (HCC) 04/22/2020   MDD (major depressive disorder), recurrent episode, severe (HCC) 04/22/2020   Relationship problems 01/31/2020   Worsening headaches 10/13/2019   Paresthesia 10/13/2019   Other symptoms and signs involving the nervous system 10/13/2019   Ocular migraine 10/04/2017   Nevus 03/15/2017   Postpartum depression 09/15/2016   Encounter for insertion of intrauterine contraceptive device (IUD) 08/05/2016   Iron deficiency anemia 03/20/2016   Borderline personality disorder (HCC) 07/09/2015   Generalized anxiety disorder 07/09/2015   Bipolar 1 disorder, mixed, moderate (HCC) 07/09/2015   Left breast lump 03/05/2015   Past Medical History:  Diagnosis Date   Headache    History of bipolar disorder    History of pancreatitis    Hx of varicella    Migraines    Vaginal Pap smear, abnormal     Past Surgical History:  Procedure Laterality Date   CESAREAN SECTION N/A 06/04/2016   Procedure: CESAREAN SECTION;  Surgeon: Kelly Delon Milian, MD;  Location: [facility];  Service: Obstetrics;  Laterality: N/A;   MOUTH SURGERY     x 2, wisdom teeth, adjust teeth   Social History   Tobacco Use   Smoking status: Former    Current packs/day: 0.00    Types: Cigarettes    Quit date: 09/16/2015    Years since quitting: 8.1   Smokeless tobacco: Never  Vaping Use   Vaping status: Never Used  Substance Use Topics   Alcohol use: Not Currently    Alcohol/week: 2.0 standard drinks of alcohol    Types: 2 Standard drinks or equivalent per week   Drug use: Never      ROS: as noted in HPI  Objective:     BP 133/88 (BP Location: Left Arm, Patient Position: Sitting, Cuff Size: Large)   Pulse 95   Resp 18   Ht 5' 4 (1.626 m)   Wt 179 lb (81.2 kg)   SpO2 99%   BMI 30.73 kg/m  BP Readings from Last 3 Encounters:  11/09/23 133/88  04/13/23 109/68  10/05/22 (!) 142/74   Wt Readings from Last 3 Encounters:  11/09/23 179 lb (81.2 kg)  04/13/23 177 lb (80.3 kg)  10/05/22 186 lb (84.4 kg)      Physical Exam Vitals and nursing note reviewed. Exam conducted with a chaperone present.  Constitutional:  General: She is not in acute distress.    Appearance: Normal appearance. She is not ill-appearing, toxic-appearing or diaphoretic.  HENT:     Head: Normocephalic and atraumatic.   Cardiovascular:     Rate and Rhythm: Normal rate.  Pulmonary:     Effort: Pulmonary effort is normal. No respiratory distress.  Abdominal:     Tenderness: There is no right CVA tenderness or left CVA tenderness.  Genitourinary:    Pubic Area: No rash or pubic lice.      Labia:        Right: No rash, tenderness, lesion or injury.        Left: No rash, tenderness, lesion or injury.      Urethra: No prolapse, urethral pain, urethral swelling or urethral lesion.     Vagina: Normal. No  vaginal discharge, tenderness or lesions.     Cervix: Normal. No cervical motion tenderness, discharge, friability, lesion, erythema or cervical bleeding.     Uterus: Normal.      Adnexa: Right adnexa normal and left adnexa normal.       Right: No mass, tenderness or fullness.         Left: No mass, tenderness or fullness.       Rectum: Normal.     Comments: IUD strings noted in place;  Lymphadenopathy:     Lower Body: No right inguinal adenopathy. No left inguinal adenopathy.   Skin:    General: Skin is warm and dry.     Coloration: Skin is not jaundiced.     Findings: No bruising, erythema or rash.   Neurological:     General: No focal deficit present.     Mental Status: She is alert and oriented to person, place, and time.      No results found for any visits on 11/09/23.  Last CBC Lab Results  Component Value Date   WBC 9.3 10/05/2022   HGB 14.8 10/05/2022   HCT 44.0 10/05/2022   MCV 89.6 10/05/2022   MCH 30.1 10/05/2022   RDW 12.0 10/05/2022   PLT 426 (H) 10/05/2022   Last metabolic panel Lab Results  Component Value Date   GLUCOSE 84 10/05/2022   NA 140 10/05/2022   K 4.7 10/05/2022   CL 104 10/05/2022   CO2 27 10/05/2022   BUN 10 10/05/2022   CREATININE 0.85 10/05/2022   EGFR 88 10/05/2022   CALCIUM 9.5 10/05/2022   PROT 6.8 10/05/2022   ALBUMIN 3.8 04/21/2020   BILITOT 0.6 10/05/2022   ALKPHOS 83 04/21/2020   AST 18 10/05/2022   ALT 19 10/05/2022   ANIONGAP 13 04/21/2020   Last lipids Lab Results  Component Value Date   CHOL 184 04/21/2020   HDL 46 04/21/2020   LDLCALC 128 (H) 04/21/2020   TRIG 52 04/21/2020   CHOLHDL 4.0 04/21/2020   Last hemoglobin A1c Lab Results  Component Value Date   HGBA1C 5.1 04/21/2020   Last thyroid functions Lab Results  Component Value Date   TSH 1.86 10/05/2022   Last vitamin D No results found for: 25OHVITD2, 25OHVITD3, VD25OH Last vitamin B12 and Folate No results found for: VITAMINB12,  FOLATE    The ASCVD Risk score (Arnett DK, et al., 2019) failed to calculate for the following reasons:   Cannot find a previous HDL lab   Cannot find a previous total cholesterol lab  Assessment & Plan:  Vaginal odor -     NuSwab Vaginitis Plus (VG+)  IUD check up  Nuswab vaginitis panel obtained, specifically assessing for BV. Normal appearing cervical mucous noted on exam, therefore await swab results. IUD appears in place, normal appearing strings. Unremarkable exam. Reassurance provided.   No follow-ups on file.   Benton LITTIE Gave, PA

## 2023-11-10 ENCOUNTER — Ambulatory Visit: Payer: Self-pay | Admitting: Urgent Care

## 2023-11-10 DIAGNOSIS — B9689 Other specified bacterial agents as the cause of diseases classified elsewhere: Secondary | ICD-10-CM

## 2023-11-10 LAB — NUSWAB VAGINITIS PLUS (VG+)
Candida albicans, NAA: NEGATIVE
Candida glabrata, NAA: NEGATIVE

## 2023-11-10 MED ORDER — METRONIDAZOLE 500 MG PO TABS: 500.0000 mg | ORAL_TABLET | Freq: Two times a day (BID) | ORAL | 0 refills | Status: DC

## 2023-11-12 LAB — NUSWAB VAGINITIS PLUS (VG+)
Atopobium vaginae: HIGH {score} — AB
BVAB 2: HIGH {score} — AB
Megasphaera 1: HIGH {score} — AB

## 2023-11-18 ENCOUNTER — Ambulatory Visit: Admitting: Medical-Surgical

## 2024-01-21 ENCOUNTER — Telehealth (INDEPENDENT_AMBULATORY_CARE_PROVIDER_SITE_OTHER): Admitting: Physician Assistant

## 2024-01-21 DIAGNOSIS — G43E11 Chronic migraine with aura, intractable, with status migrainosus: Secondary | ICD-10-CM

## 2024-01-21 DIAGNOSIS — G43109 Migraine with aura, not intractable, without status migrainosus: Secondary | ICD-10-CM

## 2024-01-21 MED ORDER — QULIPTA 30 MG PO TABS: 30.0000 mg | ORAL_TABLET | Freq: Every day | ORAL | 1 refills | Status: AC

## 2024-01-21 MED ORDER — UBRELVY 100 MG PO TABS: 1.0000 | ORAL_TABLET | ORAL | 5 refills | Status: AC | PRN

## 2024-01-22 ENCOUNTER — Encounter: Payer: Self-pay | Admitting: Physician Assistant

## 2024-01-22 NOTE — Progress Notes (Signed)
..  Virtual Visit via Video Note  I connected with [name] on 01/21/2024 at  8:50 AM EDT by a video enabled telemedicine application and verified that I am speaking with the correct person using two identifiers.  Location: Patient: work Provider: clinic  .SABRAParticipating in visit:  Patient: [name] Provider: Vermell Bologna PA-C   I discussed the limitations of evaluation and management by telemedicine and the availability of in person appointments. The patient expressed understanding and agreed to proceed.  History of Present Illness: Pt is a 43 yo female who calls into the clinic for refills of migraine medication. Her migraines are not controlled. She is having 10-15 migraine days a month. She admits she stopped taking Qulipta . She uses Ubrelvy  50mg  but has to take with ibuprofen . Her mood is doing well. She is moving out of her parents house. She denies any SI/HC.     Observations/Objective: No acute distress Normal breathing Normal mood and appearance   Assessment and Plan: .SABRASABRADiagnoses and all orders for this visit:  Ocular migraine -     Atogepant  (QULIPTA ) 30 MG TABS; Take 1 tablet (30 mg total) by mouth daily at 2 am. -     Ubrogepant  (UBRELVY ) 100 MG TABS; Take 1 tablet (100 mg total) by mouth as needed. May repeat in 2 hours if needed up to 200mg  a day.  Intractable chronic migraine with aura with status migrainosus -     Atogepant  (QULIPTA ) 30 MG TABS; Take 1 tablet (30 mg total) by mouth daily at 2 am. -     Ubrogepant  (UBRELVY ) 100 MG TABS; Take 1 tablet (100 mg total) by mouth as needed. May repeat in 2 hours if needed up to 200mg  a day.  Migraines not not controlled Increased dose and told to restart Qulipta  30mg  Increased Ubrelvy  to 100mg  for rescue  Start OTC Migrarelief. If no improvement consider referral to headache specialist    Follow Up Instructions:    I discussed the assessment and treatment plan with the patient. The patient was provided an  opportunity to ask questions and all were answered. The patient agreed with the plan and demonstrated an understanding of the instructions.   The patient was advised to call back or seek an in-person evaluation if the symptoms worsen or if the condition fails to improve as anticipated.   Delberta Folts, PA-C

## 2024-01-27 ENCOUNTER — Encounter: Payer: Self-pay | Admitting: Physician Assistant

## 2024-02-11 ENCOUNTER — Telehealth: Payer: Self-pay

## 2024-02-11 NOTE — Telephone Encounter (Signed)
 Pharmacy Patient Advocate Encounter  Received notification from [employer] that Prior Authorization for Ubrelvy  50MG  tablets  has been APPROVED from 02/11/24 to 02/10/25   PA #/Case ID/Reference #: [id number]   *RENEWAL*

## 2024-02-11 NOTE — Telephone Encounter (Signed)
 Pharmacy Patient Advocate Encounter   Received notification from CoverMyMeds that prior authorization for Ubrelvy  50MG  tablets is due for renewal.   Insurance verification completed.   The patient is insured through [employer].  Action: PA required; PA submitted to above mentioned insurance via Latent Key/confirmation #/EOC [facility] Status is pending

## 2024-04-11 ENCOUNTER — Other Ambulatory Visit (HOSPITAL_COMMUNITY): Payer: Self-pay

## 2024-04-12 ENCOUNTER — Other Ambulatory Visit (HOSPITAL_COMMUNITY): Payer: Self-pay

## 2024-04-12 ENCOUNTER — Telehealth: Payer: Self-pay

## 2024-04-12 NOTE — Telephone Encounter (Signed)
 Copied from CRM #[id number]. Topic: Clinical - Medication Prior Auth >> Apr 12, 2024 10:08 AM [name] wrote: Reason for CRM: Ubrogepant  (UBRELVY ) 100 MG TABS - insurance requiring PA

## 2024-04-12 NOTE — Telephone Encounter (Signed)
 Pharmacy Patient Advocate Encounter   Received notification from Pt Calls Messages that prior authorization for Ubrelvy  100mg  tabs is required/requested.   Insurance verification completed.   The patient is insured through [employer].   Per test claim: PA required; PA submitted to above mentioned insurance via Latent Key/confirmation #/EOC [id number] Status is pending

## 2024-04-12 NOTE — Telephone Encounter (Signed)
 Pharmacy Patient Advocate Encounter  Received notification from [employer] that Prior Authorization for Ubrelvy  100mg  tabs has been APPROVED from 04/12/24 to 04/12/25   PA #/Case ID/Reference #: [id number]

## 2024-04-27 ENCOUNTER — Telehealth: Payer: Self-pay

## 2024-04-27 ENCOUNTER — Other Ambulatory Visit (HOSPITAL_COMMUNITY): Payer: Self-pay

## 2024-04-27 NOTE — Telephone Encounter (Signed)
 Pharmacy Patient Advocate Encounter   Received notification from Onbase that prior authorization for Qulipta  30MG  tablets  is required/requested.   Insurance verification completed.   The patient is insured through [employer].   Per test claim: PA required and submitted KEY/EOC/Request #: [id number] from 04/27/24 to 04/27/25. Ran test claim, Copay is $0. This test claim was processed through [facility]- copay amounts may vary at other pharmacies due to pharmacy/plan contracts, or as the patient moves through the different stages of their insurance plan.

## 2024-06-01 ENCOUNTER — Encounter: Payer: Self-pay | Admitting: Physician Assistant

## 2024-06-02 NOTE — Telephone Encounter (Signed)
 Recommend virtual visit since she does live that far out.  At least they can discuss whether or not there may be some triggers that could be improved upon or whether or not she really needs to be on controller medicine at this point, which she may.

## 2024-06-06 ENCOUNTER — Telehealth (INDEPENDENT_AMBULATORY_CARE_PROVIDER_SITE_OTHER): Admitting: Physician Assistant

## 2024-06-06 VITALS — HR 80 | Ht 63.0 in | Wt 183.0 lb

## 2024-06-06 DIAGNOSIS — G43E11 Chronic migraine with aura, intractable, with status migrainosus: Secondary | ICD-10-CM | POA: Diagnosis not present

## 2024-06-06 DIAGNOSIS — R0683 Snoring: Secondary | ICD-10-CM

## 2024-06-06 DIAGNOSIS — G478 Other sleep disorders: Secondary | ICD-10-CM

## 2024-06-06 DIAGNOSIS — E66811 Obesity, class 1: Secondary | ICD-10-CM

## 2024-06-06 DIAGNOSIS — G4440 Drug-induced headache, not elsewhere classified, not intractable: Secondary | ICD-10-CM

## 2024-06-06 DIAGNOSIS — Z6832 Body mass index (BMI) 32.0-32.9, adult: Secondary | ICD-10-CM | POA: Diagnosis not present

## 2024-06-06 DIAGNOSIS — E6609 Other obesity due to excess calories: Secondary | ICD-10-CM

## 2024-06-06 DIAGNOSIS — G43109 Migraine with aura, not intractable, without status migrainosus: Secondary | ICD-10-CM

## 2024-06-06 MED ORDER — QULIPTA 60 MG PO TABS: ORAL_TABLET | ORAL | 0 refills | Status: AC

## 2024-06-06 MED ORDER — ONDANSETRON 8 MG PO TBDP: 8.0000 mg | ORAL_TABLET | Freq: Three times a day (TID) | ORAL | 1 refills | Status: AC | PRN

## 2024-06-06 NOTE — Progress Notes (Unsigned)
 ..Virtual Visit via Video Note  I connected with [name] on 06/07/24 at  2:20 PM EST by a video enabled telemedicine application and verified that I am speaking with the correct person using two identifiers.  Location: Patient: work Provider: clinic  .SABRAParticipating in visit:  Patient: [name] Provider: Vermell Bologna PA-C Provider in training: Annitta Raw PA-S   I discussed the limitations of evaluation and management by telemedicine and the availability of in person appointments. The patient expressed understanding and agreed to proceed.  History of Present Illness: .Discussed the use of AI scribe software for clinical note transcription with the patient, who gave verbal consent to proceed.  History of Present Illness [name] is a 44 year old female with chronic migraines who presents with worsening migraine symptoms.  Migraine headaches - Migraines occur three to four times per week, often present upon awakening. About 20 migraine days a month.  - Intensity of migraines has been increasing - Pain is located behind the eyes with sharp pain in the upper frontal region of the head - Associated symptoms include nausea, occasional vomiting, and blurry vision - Frequently requires working in a dark office with sunglasses due to severity - Migraine cap provides temporary relief only while worn  Migraine management and medication response - Uses Ubrelvy  for acute migraine relief; provides partial relief approximately 25% of the time - Takes Advil  (ibuprofen ) when Ubrelvy  is ineffective; prefers ibuprofen  due to stomach upset with Tylenol  - On Qulipta  30 mg daily for migraine prevention - Previously trialed Ajovy  (ineffective), triptans including Moxeza and Imitrex, topamax , propranolol, and Nurtec (less effective than Ubrelvy ) - Has seen a neurologist and a headache specialist in the past  Sleep disturbance and possible sleep apnea - Does not feel rested upon waking -  Daughter reports snoring, though patient denies it  Caffeine and lifestyle factors - Consumes soda but does not believe caffeine intake exceeds 150 mg daily - Has not had massages due to cost - Uncertain about presence of neck tension      Observations/Objective: NO acute distress Normal mood and appearance.  Normal breathing.   . Today's Vitals   06/06/24 1411  Pulse: 80  Weight: 183 lb (83 kg)  Height: 5' 3 (1.6 m)   Body mass index is 32.42 kg/m.   .    06/06/2024    2:12 PM 09/05/2021    4:23 PM 04/09/2021    8:01 AM 11/26/2020    6:55 AM 09/18/2020    8:15 AM  Depression screen PHQ 2/9  Decreased Interest 0 1 1 0 3  Down, Depressed, Hopeless 0 1 3 1 1   PHQ - 2 Score 0 2 4 1 4   Altered sleeping 1 0 2 1 3   Tired, decreased energy 3 0 3 1 3   Change in appetite 0 0 3 1 0  Feeling bad or failure about yourself  0 0 3 1 3   Trouble concentrating 0 0 1 0 3  Moving slowly or fidgety/restless 0 0 1 0 3  Suicidal thoughts 0 0 0 0 1  PHQ-9 Score 4 2  17  5  20    Difficult doing work/chores Not difficult at all Not difficult at all Very difficult Somewhat difficult Somewhat difficult     Data saved with a previous flowsheet row definition   ..    06/06/2024    2:13 PM 09/05/2021    4:24 PM 04/09/2021    8:03 AM 11/26/2020    6:54  AM  GAD 7 : Generalized Anxiety Score  Nervous, Anxious, on Edge 2 1  3  1    Control/stop worrying 2 1  3   0   Worry too much - different things 2 1  3  1    Trouble relaxing 2 0  3  0   Restless 2 0  3  1   Easily annoyed or irritable 1 1  3  1    Afraid - awful might happen 0 0  3  1   Total GAD 7 Score 11 4 21 5   Anxiety Difficulty Not difficult at all Not difficult at all Very difficult Somewhat difficult     Data saved with a previous flowsheet row definition     Assessment and Plan: .Diagnoses and all orders for this visit:  Intractable chronic migraine with aura with status migrainosus -     Atogepant  (QULIPTA ) 60 MG TABS;  Take one tablet daily for migraine prevention. -     Home sleep test; Future -     ondansetron  (ZOFRAN -ODT) 8 MG disintegrating tablet; Take 1 tablet (8 mg total) by mouth every 8 (eight) hours as needed. -     Ambulatory referral to Neurology  Ocular migraine -     Home sleep test; Future -     Ambulatory referral to Neurology  Class 1 obesity due to excess calories without serious comorbidity with body mass index (BMI) of 32.0 to 32.9 in adult -     Home sleep test; Future  Non-restorative sleep -     Home sleep test; Future  Snoring -     Home sleep test; Future  Medication overuse headache   Assessment & Plan Intractable chronic migraine with aura with status migrainosus Chronic migraines with aura, 3-4 times weekly, partially relieved by Ubrelvy . Previous treatments with Ajovy , topamax , nurtec, triptians and lots of NSAIDs. Considered sleep apnea as a factor. Discussed Botox and headache specialist referral. Considered Migrelief. Discussed rebound headaches from ibuprofen , caffeine, and alcohol. - Increased Qulipta  to 60 mg daily. - Continue Ubrelvy  100 mg as needed. Max 2 tablets a day.  - Referred to headache specialist. - Recommended Migrelief, two capsules at bedtime. - Advised lifestyle modifications: limit caffeine to 150 mg/day, avoid excessive alcohol, reduce over-the-counter anti-inflammatories. Discussed with patient medication overuse headaches.  - Advised headaches could get worse before better cutting back on NSAIDs.   Class 1 obesity May contribute to non-restorative sleep and potential sleep apnea, exacerbating migraines.  Non-restorative sleep and suspected sleep apnea Non-restorative sleep with potential snoring suggests sleep apnea, possibly increasing migraine frequency and severity. Discussed home sleep study. - Ordered home sleep study.     Follow Up Instructions:    I discussed the assessment and treatment plan with the patient. The patient was  provided an opportunity to ask questions and all were answered. The patient agreed with the plan and demonstrated an understanding of the instructions.   The patient was advised to call back or seek an in-person evaluation if the symptoms worsen or if the condition fails to improve as anticipated.    Onyx Schirmer, PA-C

## 2024-06-06 NOTE — Patient Instructions (Signed)
 ..  Keep headache diary and bring to your next appointment  Lifestyle changes to improve headache frequency:   Regular sleep routine -- go to bed at the same time and get up at the same time-- everyday, even on the weekends Regular exercise-40 minutes 3-4 times weekly (150 minutes weekly) Maintaining a healthy weight--portion control Avoid caffeine. Decaf tea or coffee is ok. (limit to150 mg daily) Avoid soda (caffeine or non)  Avoid artificial sweetners Avoid alcohol Avoid smoking/secondhand smoke Avoid ibuprofen, acetaminophen, naproxen, pseudoephedrine and other OTC analgesics Avoid opioids, narcotics and prescribed pain medication  Rebound Headaches/Medication overuse headache -- Overusing rescue medications more than twice weekly will worsen headache frequency and intensity over time. Will improve as you wean over the counter medications, ie-- Ibuprofen/ Tylenol/Excedrin, etc. and caffeine- Headaches may get worse before getting better as you withdraw from medication overuse and/or caffeine   Start Magnesium 400-500mg  twice daily Start Vitamin B2 100-200mg  twice daily OR Migrelief (on [employer])

## 2024-06-07 ENCOUNTER — Encounter: Payer: Self-pay | Admitting: Physician Assistant

## 2024-06-07 DIAGNOSIS — G478 Other sleep disorders: Secondary | ICD-10-CM | POA: Insufficient documentation

## 2024-06-07 DIAGNOSIS — G43E11 Chronic migraine with aura, intractable, with status migrainosus: Secondary | ICD-10-CM | POA: Insufficient documentation

## 2024-06-07 DIAGNOSIS — R0683 Snoring: Secondary | ICD-10-CM | POA: Insufficient documentation
